# Patient Record
Sex: Female | Born: 1955 | Race: White | Hispanic: No | Marital: Married | Smoking: Never smoker
Health system: Southern US, Community
[De-identification: ages and names within clinical notes are randomized; demographics above are authoritative.]

## PROBLEM LIST (undated history)

## (undated) DIAGNOSIS — Z789 Other specified health status: Secondary | ICD-10-CM

## (undated) DIAGNOSIS — C801 Malignant (primary) neoplasm, unspecified: Secondary | ICD-10-CM

## (undated) HISTORY — PX: COLONOSCOPY: SHX174

## (undated) HISTORY — PX: ABDOMINAL HYSTERECTOMY: SHX81

---
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
## 1999-10-15 ENCOUNTER — Other Ambulatory Visit: Admission: RE | Admit: 1999-10-15 | Discharge: 1999-10-15 | Payer: Self-pay | Admitting: Obstetrics & Gynecology

## 2000-04-28 ENCOUNTER — Ambulatory Visit (HOSPITAL_COMMUNITY): Admission: RE | Admit: 2000-04-28 | Discharge: 2000-04-28 | Payer: Self-pay | Admitting: Anesthesiology

## 2000-04-28 ENCOUNTER — Encounter: Payer: Self-pay | Admitting: Anesthesiology

## 2002-01-19 ENCOUNTER — Other Ambulatory Visit: Admission: RE | Admit: 2002-01-19 | Discharge: 2002-01-19 | Payer: Self-pay | Admitting: Obstetrics & Gynecology

## 2003-04-28 HISTORY — PX: DIAGNOSTIC LAPAROSCOPY: SUR761

## 2003-05-17 ENCOUNTER — Encounter (INDEPENDENT_AMBULATORY_CARE_PROVIDER_SITE_OTHER): Payer: Self-pay | Admitting: Specialist

## 2003-05-17 ENCOUNTER — Ambulatory Visit (HOSPITAL_COMMUNITY): Admission: RE | Admit: 2003-05-17 | Discharge: 2003-05-17 | Payer: Self-pay | Admitting: Obstetrics & Gynecology

## 2004-12-31 ENCOUNTER — Other Ambulatory Visit: Admission: RE | Admit: 2004-12-31 | Discharge: 2004-12-31 | Payer: Self-pay | Admitting: Obstetrics & Gynecology

## 2006-05-05 ENCOUNTER — Encounter: Admission: RE | Admit: 2006-05-05 | Discharge: 2006-05-05 | Payer: Self-pay | Admitting: Obstetrics & Gynecology

## 2007-05-18 ENCOUNTER — Encounter: Admission: RE | Admit: 2007-05-18 | Discharge: 2007-05-18 | Payer: Self-pay | Admitting: Obstetrics & Gynecology

## 2008-05-18 ENCOUNTER — Encounter: Admission: RE | Admit: 2008-05-18 | Discharge: 2008-05-18 | Payer: Self-pay | Admitting: Obstetrics & Gynecology

## 2009-05-20 ENCOUNTER — Encounter: Admission: RE | Admit: 2009-05-20 | Discharge: 2009-05-20 | Payer: Self-pay | Admitting: Obstetrics & Gynecology

## 2010-05-18 ENCOUNTER — Encounter: Payer: Self-pay | Admitting: Obstetrics & Gynecology

## 2010-05-21 ENCOUNTER — Encounter
Admission: RE | Admit: 2010-05-21 | Discharge: 2010-05-21 | Payer: Self-pay | Source: Home / Self Care | Attending: Obstetrics & Gynecology | Admitting: Obstetrics & Gynecology

## 2010-08-20 ENCOUNTER — Other Ambulatory Visit: Payer: Self-pay | Admitting: Plastic Surgery

## 2010-09-12 NOTE — Op Note (Signed)
Dana Butler, PERDEW                         ACCOUNT NO.:  [id number]   MEDICAL RECORD NO.:  [id number]                   PATIENT TYPE:  AMB   LOCATION:  SDC                                  FACILITY:  [facility]   PHYSICIAN:  Freddy Finner, M.D.                DATE OF BIRTH:  01-01-1956   DATE OF PROCEDURE:  05/17/2003  DATE OF DISCHARGE:                                 OPERATIVE REPORT   PREOPERATIVE DIAGNOSES:  1. Chronic pelvic pain.  2. Complex cystic mass of left ovary.  3. Benign previous pelvic endometriosis.   POSTOPERATIVE DIAGNOSES:  1. Pelvic adhesions.  2. Benign follicular-type cyst of left ovary.  3. Benign follicular cyst of right ovary.   OPERATIVE PROCEDURE:  Lysis of pelvic adhesions, bilateral salpingo-  oophorectomy.   SURGEON:  Freddy Finner, M.D.   ANESTHESIA:  General.   ESTIMATED INTRAOPERATIVE BLOOD LOSS:  Twenty cubic centimeters or less.   INTRAOPERATIVE COMPLICATIONS:  None.   SECONDARY PROCEDURE:  Excision of keloid at umbilical scar.   Details of the present illness are recorded in the admission note.  Patient  was admitted on the morning of surgery.  She was brought to the operating  room, there placed under adequate general anesthesia.  She was placed in a  dorsolithotomy position using the [employer].  Betadine prep was  carried out in the usual fashion and bladder was evacuated with a Robinson  catheter under sterile technique.  Sponge forceps in the jaw of the sponge  __________ sponge on the tip was placed into the vagina for potential use  during the procedure.  Sterile drapes were applied.  A keloid scar at the  umbilicus was sharply excised.  The incision was carried down to fascia.  The anterior abdominal wall was elevated manually and a 11 mm 9 bladed  disposable trocar introduced at the umbilicus without difficulty.  Direct  inspection revealed no evidence of injury on entry.  Pneumoperitoneum was  allowed to accumulate  with carbon dioxide gas.  A small incision was made  through an old transverse lower abdominal scar in the midline and through it  a 5 mm trocar introduced without difficulty under direct visualization.  Through this trocar sleeve grasping forceps and the [place]hat were used during  the procedure.  __________ examination of pelvic and abdominal contents was  carried out, findings are recorded in still photographs and basically are  the postoperative diagnosis, appendix was visualized and was normal.  Initially the endoshears were used through the operating channel of the  laparoscope to dissect free the sigmoid epiploica on the left, the left tube  and ovary was then elevated and using the Gyrus bipolar device the  infundibulopelvic and other pedicles associated with the tube and ovary were  fulgurated and sharply divided.  There was no apparent gross abnormality of  the ovary except for some cysts, the  ovary was morcellated and retrieved  through the umbilical trocar sleeve without difficulty.  The right ovary was  stuck down against the pelvic sidewall.  Initial dissection was carried out  using the Gyrus and developing the infundibulopelvic ligament.  In the  process of attempting to bluntly dissect the ovary free a cyst was entered,  the cyst wall was lining the pelvic sidewall with very dense adhesions.  The  remaining portion of the ovary and tube were freed up with the Gyrus and  bipolar fulguration and sharp division, this ovary too was morcellated and  retrieved through the operating umbilical trocar sleeve.  An attempt was  then made to dissect the cyst wall off the pelvic sidewall, bleeding was  immediately encountered and it was elected to abandon this, there was  minimal ovarian tissue remaining and all the tissue was fulgurated with the  bipolar as well as controlling the bleeding.  Irrigation was then carried  out.  Careful examination under reduced intra-abdominal pressure and   irrigating solution revealed complete hemostasis, irrigating solution was  aspirated from the abdomen, all instruments were removed.  The skin  incisions were closed with interrupted subcuticular sutures with 3-0 Dexon.  Quarter-inch Steri-Strips were applied to the lower incision.  Patient was  awakened at this point and taken to recovery in good condition.                                               Freddy Finner, M.D.    WRN/MEDQ  D:  05/17/2003  T:  05/17/2003  Job:  [id number]

## 2010-09-12 NOTE — H&P (Signed)
Dana Butler, SAEPHAN                         ACCOUNT NO.:  [id number]   MEDICAL RECORD NO.:  [id number]                   PATIENT TYPE:  AMB   LOCATION:  SDC                                  [facility]ILITY:  [facility]   PHYSICIAN:  Freddy Finner, M.D.                DATE OF BIRTH:  [birth year]   DATE OF ADMISSION:  05/17/2003  DATE OF DISCHARGE:                                HISTORY & PHYSICAL   ADMITTING DIAGNOSES:  1. Chronic pelvic pain.  2. Known previous pelvic endometriosis.  3. 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   BRIEF HISTORY:  Patient is a 55 year old white married female gravida 3,  para 3 who has had significant GYN issues in the past resulting in a total  abdominal hysterectomy for very large uterine leiomyomata and pelvic  endometriosis done in April 1999.  In the interval since that time she has  done reasonably well until the recent past at which time she has begun to  have chronic pelvic pain.  Pelvic ultrasound in the office has shown a  complex cystic left adnexal mass in November 2004.  This is most consistent  with a hemorrhagic cyst or endometrioma.  She is known to have a history of  endometriosis.  Based on her continued symptoms she is now admitted for  laparoscopic bilateral salpingo-oophorectomy.   PAST MEDICAL HISTORY:  Patient has no known significant medical illnesses.  She is currently using Vivelle 0.05 patches twice weekly.  Surgical history  is remarkable for the abdominal hysterectomy as noted above and three  vaginal deliveries.  She has never had a blood transfusion.  She has no  known allergies to medications.  She only uses ethanol socially.  She does  not use cigarettes.   PHYSICAL EXAMINATION:  HEENT:  Grossly within normal limits.  NECK:  Thyroid gland is not palpably enlarged.  VITAL SIGNS:  Blood pressure is the office is 104/66.  CHEST:  Clear to auscultation.  HEART:  Normal sinus rhythm, without  murmurs, rubs, or gallops.  BREAST:  Normal; no nipple discharge, no skin change, no palpable masses.  ABDOMEN:  Soft and nontender, without appreciable organomegaly or palpable  masses.  EXTREMITIES:  Without cyanosis, clubbing, or edema.  PELVIC:  Deferred at this time.   ASSESSMENT:  Chronic pelvic pain, known pelvic endometriosis, known previous  ovarian cyst.   PLAN:  Laparoscopically assisted bilateral salpingo-oophorectomy.                                               Freddy Finner, M.D.    WRN/MEDQ  D:  05/16/2003  T:  05/16/2003  Job:  [id number]

## 2011-04-15 ENCOUNTER — Other Ambulatory Visit: Payer: Self-pay | Admitting: Obstetrics & Gynecology

## 2011-04-15 DIAGNOSIS — Z1231 Encounter for screening mammogram for malignant neoplasm of breast: Secondary | ICD-10-CM

## 2011-05-25 ENCOUNTER — Ambulatory Visit
Admission: RE | Admit: 2011-05-25 | Discharge: 2011-05-25 | Disposition: A | Discharge status: Home / Self Care | Payer: BC Managed Care – PPO | Source: Ambulatory Visit | Attending: Obstetrics & Gynecology | Admitting: Obstetrics & Gynecology

## 2011-05-25 DIAGNOSIS — Z1231 Encounter for screening mammogram for malignant neoplasm of breast: Secondary | ICD-10-CM

## 2011-05-27 ENCOUNTER — Other Ambulatory Visit: Payer: Self-pay | Admitting: Obstetrics & Gynecology

## 2011-05-27 DIAGNOSIS — R928 Other abnormal and inconclusive findings on diagnostic imaging of breast: Secondary | ICD-10-CM

## 2011-05-28 ENCOUNTER — Ambulatory Visit
Admission: RE | Admit: 2011-05-28 | Discharge: 2011-05-28 | Disposition: A | Discharge status: Home / Self Care | Payer: BC Managed Care – PPO | Source: Ambulatory Visit | Attending: Obstetrics & Gynecology | Admitting: Obstetrics & Gynecology

## 2011-05-28 DIAGNOSIS — R928 Other abnormal and inconclusive findings on diagnostic imaging of breast: Secondary | ICD-10-CM

## 2012-04-25 ENCOUNTER — Other Ambulatory Visit: Payer: Self-pay | Admitting: Obstetrics & Gynecology

## 2012-04-25 DIAGNOSIS — Z1231 Encounter for screening mammogram for malignant neoplasm of breast: Secondary | ICD-10-CM

## 2012-05-27 ENCOUNTER — Ambulatory Visit
Admission: RE | Admit: 2012-05-27 | Discharge: 2012-05-27 | Disposition: A | Discharge status: Home / Self Care | Payer: BC Managed Care – PPO | Source: Ambulatory Visit | Attending: Obstetrics & Gynecology | Admitting: Obstetrics & Gynecology

## 2012-05-27 DIAGNOSIS — Z1231 Encounter for screening mammogram for malignant neoplasm of breast: Secondary | ICD-10-CM

## 2013-04-25 ENCOUNTER — Other Ambulatory Visit: Payer: Self-pay

## 2013-04-25 DIAGNOSIS — Z1231 Encounter for screening mammogram for malignant neoplasm of breast: Secondary | ICD-10-CM

## 2013-05-29 ENCOUNTER — Ambulatory Visit
Admission: RE | Admit: 2013-05-29 | Discharge: 2013-05-29 | Disposition: A | Discharge status: Home / Self Care | Payer: BC Managed Care – PPO | Source: Ambulatory Visit

## 2013-05-29 DIAGNOSIS — Z1231 Encounter for screening mammogram for malignant neoplasm of breast: Secondary | ICD-10-CM

## 2014-04-27 DIAGNOSIS — C801 Malignant (primary) neoplasm, unspecified: Secondary | ICD-10-CM

## 2014-04-27 HISTORY — DX: Malignant (primary) neoplasm, unspecified: C80.1

## 2014-05-01 ENCOUNTER — Other Ambulatory Visit: Payer: Self-pay

## 2014-05-01 DIAGNOSIS — Z1231 Encounter for screening mammogram for malignant neoplasm of breast: Secondary | ICD-10-CM

## 2014-05-30 ENCOUNTER — Ambulatory Visit
Admission: RE | Admit: 2014-05-30 | Discharge: 2014-05-30 | Disposition: A | Discharge status: Home / Self Care | Payer: BLUE CROSS/BLUE SHIELD | Source: Ambulatory Visit

## 2014-05-30 DIAGNOSIS — Z1231 Encounter for screening mammogram for malignant neoplasm of breast: Secondary | ICD-10-CM

## 2014-05-31 ENCOUNTER — Other Ambulatory Visit: Payer: Self-pay | Admitting: Obstetrics & Gynecology

## 2014-05-31 DIAGNOSIS — R928 Other abnormal and inconclusive findings on diagnostic imaging of breast: Secondary | ICD-10-CM

## 2014-06-08 ENCOUNTER — Ambulatory Visit
Admission: RE | Admit: 2014-06-08 | Discharge: 2014-06-08 | Disposition: A | Discharge status: Home / Self Care | Payer: BLUE CROSS/BLUE SHIELD | Source: Ambulatory Visit | Attending: Obstetrics & Gynecology | Admitting: Obstetrics & Gynecology

## 2014-06-08 ENCOUNTER — Other Ambulatory Visit: Payer: Self-pay | Admitting: Obstetrics & Gynecology

## 2014-06-08 DIAGNOSIS — R928 Other abnormal and inconclusive findings on diagnostic imaging of breast: Secondary | ICD-10-CM

## 2014-06-15 ENCOUNTER — Other Ambulatory Visit: Payer: Self-pay | Admitting: Obstetrics & Gynecology

## 2014-06-15 DIAGNOSIS — R928 Other abnormal and inconclusive findings on diagnostic imaging of breast: Secondary | ICD-10-CM

## 2014-06-19 ENCOUNTER — Ambulatory Visit
Admission: RE | Admit: 2014-06-19 | Discharge: 2014-06-19 | Disposition: A | Discharge status: Home / Self Care | Payer: BLUE CROSS/BLUE SHIELD | Source: Ambulatory Visit | Attending: Obstetrics & Gynecology | Admitting: Obstetrics & Gynecology

## 2014-06-19 DIAGNOSIS — R928 Other abnormal and inconclusive findings on diagnostic imaging of breast: Secondary | ICD-10-CM

## 2014-06-27 ENCOUNTER — Ambulatory Visit (INDEPENDENT_AMBULATORY_CARE_PROVIDER_SITE_OTHER): Payer: Self-pay | Admitting: Surgery

## 2014-06-28 ENCOUNTER — Encounter (HOSPITAL_BASED_OUTPATIENT_CLINIC_OR_DEPARTMENT_OTHER): Payer: Self-pay | Admitting: *Deleted

## 2014-06-28 DIAGNOSIS — R591 Generalized enlarged lymph nodes: Secondary | ICD-10-CM | POA: Diagnosis present

## 2014-06-28 NOTE — H&P (Signed)
General Surgery [facility]  Dana Butler 06/27/2014 8:47 AM Location: [facility] Patient #: [id number] DOB: [birth year] Married / Language: English / Race: White Female  History of Present Illness Dana Regal MD; 06/27/2014 9:09 AM) Patient words: Eval. for lymph node bx.  The patient is a 59 year old female who presents with lymphadenopathy. Patient is seen on referral from Dr. Conchita Butler and from her husband, Dr. Georjean Butler, for evaluation of left axillary lymphadenopathy. Patient had undergone screening mammography. Abnormal lymph nodes were noted in the left axilla. Patient underwent ultrasound-guided needle biopsy showing an atypical lymphoid proliferation suspicious for a B-cell monoclonal population. Patient had noted cervical lymphadenopathy for the past 2-3 months with mild discomfort. She denies any other symptoms such as night sweats, or weight loss. Patient now presents for consideration for excisional biopsy of the left axillary lymph nodes for definitive diagnosis.   Other Problems Dana Butler Dana [place], Butler; 06/27/2014 8:47 AM) Back Pain  Past Surgical History Dana Butler [place], Butler; 06/27/2014 8:47 AM) Hysterectomy (not due to cancer) - Complete  Diagnostic Studies History Dana Regulus, Butler; 06/27/2014 8:47 AM) Colonoscopy 5-10 years ago Mammogram within last year Pap Smear 1-5 years ago  Allergies Dana Regulus, Butler; 06/27/2014 8:48 AM) No Known Drug Allergies03/05/2014  Medication History (Dana Spillers, Butler; 06/27/2014 8:50 AM) Estradiol (0.05MG /62BJ Patch TW, Transdermal) Active. Multivitamins (Oral) Active. Oscal 500/200 D-3 (500-200MG -UNIT Tablet, Oral) Active.  Social History Dana Butler Dana Butler, [place]; 06/27/2014 8:47 AM) Alcohol use Remotely quit alcohol use. Caffeine use Coffee. No drug use Tobacco use Never smoker.  Family History Dana Butler Dana [place], [place]; 06/27/2014 8:47 AM) Alcohol Abuse Father. Depression  Mother. Heart disease in female family member before age 73 Hypertension Father.  Pregnancy / Birth History Dana Butler Dana [place], [place]; 06/27/2014 8:47 AM) Age at menarche 24 years. Age of menopause 51-55 Contraceptive History Oral contraceptives. Maternal age 62-30  Review of Systems Dana Butler [place] Butler; 06/27/2014 8:47 AM) General Present- Night Sweats. Not Present- Appetite Loss, Chills, Fatigue, Fever, Weight Gain and Weight Loss. Skin Not Present- Change in Wart/Mole, Dryness, Hives, Jaundice, New Lesions, Non-Healing Wounds, Rash and Ulcer. Respiratory Not Present- Bloody sputum, Chronic Cough, Difficulty Breathing, Snoring and Wheezing. Musculoskeletal Present- Back Pain. Not Present- Joint Pain, Joint Stiffness, Muscle Pain, Muscle Weakness and Swelling of Extremities. Neurological Present- Headaches. Not Present- Decreased Memory, Fainting, Numbness, Seizures, Tingling, Tremor, Trouble walking and Weakness. Hematology Not Present- Easy Bruising, Excessive bleeding, Gland problems, HIV and Persistent Infections.   Vitals (Dana Butler; 06/27/2014 9:07 AM) 06/27/2014 8:50 AM Weight: 124 lb Height: 62in Body Surface Area: 1.57 m Body Mass Index: 22.68 kg/m Pulse: 80 (Regular)  BP: 118/70 (Sitting, Left Arm, Standard)    Physical Exam Dana Regal MD; 06/27/2014 9:11 AM)  General - appears comfortable, no distress; not diaphorectic  HEENT - normocephalic; sclerae clear, gaze conjugate; mucous membranes moist, dentition good; voice normal  Neck - symmetric on extension; no palpable masses in the thyroid bed; palpable 1-2 cm smooth soft lymph nodes in the posterior cervical chain bilaterally; no anterior cervical lymph nodes; two 1-2 cm soft rounded lymph nodes in the left scalene region  Axilla - no palpable adenopathy in the right axilla; left axilla with palpable rounded relatively firm lymph nodes in the high axilla against the chest wall, nontender  Chest -  clear bilaterally with rhonchi, rales, or wheeze  Cor - regular rhythm with normal rate; no significant murmur  Abd - soft without distension; no hepatosplenomegaly; no  sign of hernia  Ext - non-tender without significant edema or lymphedema  Neuro - grossly intact; no tremor    Assessment & Plan Dana Regal MD; 06/27/2014 9:13 AM)  LYMPHADENOPATHY (785.6  R59.1)  Patient has lymphadenopathy involving the left axilla, the posterior cervical lymph node chains, in the left interscalene lymph nodes. Previous needle biopsy shows an atypical lymphoproliferative process. Further tissue is requested for definitive diagnosis.  I have recommended left axillary lymph node excisional biopsy under general anesthesia. We will make arrangements for this procedure in the immediate future. Risk and benefits are reviewed with the patient and her husband including the possibility of seroma formation. They understand and wish to proceed.  The risks and benefits of the procedure have been discussed at length with the patient. The patient understands the proposed procedure, potential alternative treatments, and the course of recovery to be expected. All of the patient's questions have been answered at this time. The patient wishes to proceed with surgery.  Dana Regal, MD, [facility] Office: [phone]

## 2014-06-28 NOTE — Progress Notes (Signed)
No labs needed

## 2014-06-29 ENCOUNTER — Ambulatory Visit (HOSPITAL_BASED_OUTPATIENT_CLINIC_OR_DEPARTMENT_OTHER)
Admission: RE | Admit: 2014-06-29 | Discharge: 2014-06-29 | Disposition: A | Discharge status: Home / Self Care | Payer: BLUE CROSS/BLUE SHIELD | Source: Ambulatory Visit | Attending: Surgery | Admitting: Surgery

## 2014-06-29 ENCOUNTER — Ambulatory Visit (HOSPITAL_BASED_OUTPATIENT_CLINIC_OR_DEPARTMENT_OTHER): Payer: BLUE CROSS/BLUE SHIELD | Admitting: Anesthesiology

## 2014-06-29 ENCOUNTER — Encounter (HOSPITAL_BASED_OUTPATIENT_CLINIC_OR_DEPARTMENT_OTHER): Payer: Self-pay | Admitting: *Deleted

## 2014-06-29 ENCOUNTER — Encounter (HOSPITAL_BASED_OUTPATIENT_CLINIC_OR_DEPARTMENT_OTHER)
Admission: RE | Disposition: A | Discharge status: Home / Self Care | Payer: Self-pay | Source: Ambulatory Visit | Attending: Surgery

## 2014-06-29 DIAGNOSIS — C8514 Unspecified B-cell lymphoma, lymph nodes of axilla and upper limb: Secondary | ICD-10-CM | POA: Diagnosis not present

## 2014-06-29 DIAGNOSIS — Z79899 Other long term (current) drug therapy: Secondary | ICD-10-CM | POA: Diagnosis not present

## 2014-06-29 DIAGNOSIS — R591 Generalized enlarged lymph nodes: Secondary | ICD-10-CM | POA: Diagnosis present

## 2014-06-29 DIAGNOSIS — Z79810 Long term (current) use of selective estrogen receptor modulators (SERMs): Secondary | ICD-10-CM | POA: Diagnosis not present

## 2014-06-29 HISTORY — DX: Other specified health status: Z78.9

## 2014-06-29 HISTORY — PX: AXILLARY LYMPH NODE BIOPSY: SHX5737

## 2014-06-29 LAB — POCT HEMOGLOBIN-HEMACUE: Hemoglobin: 11.2 g/dL — ABNORMAL LOW (ref 12.0–15.0)

## 2014-06-29 SURGERY — AXILLARY LYMPH NODE BIOPSY
Anesthesia: General | Site: Axilla | Laterality: Left

## 2014-06-29 MED ORDER — LIDOCAINE HCL (CARDIAC) 20 MG/ML IV SOLN
INTRAVENOUS | Status: DC | PRN
  Administered 2014-06-29: 60 mg via INTRAVENOUS

## 2014-06-29 MED ORDER — HYDROMORPHONE HCL 1 MG/ML IJ SOLN: 0.2500 mg | INTRAMUSCULAR | Status: DC | PRN

## 2014-06-29 MED ORDER — LACTATED RINGERS IV SOLN
INTRAVENOUS | Status: DC
  Administered 2014-06-29: 11:00:00 via INTRAVENOUS

## 2014-06-29 MED ORDER — METHYLENE BLUE 1 % INJ SOLN
INTRAMUSCULAR | Status: AC
  Filled 2014-06-29: qty 10

## 2014-06-29 MED ORDER — OXYCODONE HCL 5 MG/5ML PO SOLN: 5.0000 mg | Freq: Once | ORAL | Status: DC | PRN

## 2014-06-29 MED ORDER — BUPIVACAINE HCL (PF) 0.5 % IJ SOLN
INTRAMUSCULAR | Status: DC | PRN
  Administered 2014-06-29: 10 mL

## 2014-06-29 MED ORDER — MIDAZOLAM HCL 2 MG/2ML IJ SOLN
1.0000 mg | INTRAMUSCULAR | Status: DC | PRN
  Administered 2014-06-29: 2 mg via INTRAVENOUS

## 2014-06-29 MED ORDER — DEXAMETHASONE SODIUM PHOSPHATE 4 MG/ML IJ SOLN
INTRAMUSCULAR | Status: DC | PRN
  Administered 2014-06-29: 10 mg via INTRAVENOUS

## 2014-06-29 MED ORDER — MIDAZOLAM HCL 2 MG/2ML IJ SOLN
INTRAMUSCULAR | Status: AC
  Filled 2014-06-29: qty 2

## 2014-06-29 MED ORDER — FENTANYL CITRATE 0.05 MG/ML IJ SOLN
50.0000 ug | INTRAMUSCULAR | Status: DC | PRN
  Administered 2014-06-29: 100 ug via INTRAVENOUS

## 2014-06-29 MED ORDER — HYDROCODONE-ACETAMINOPHEN 5-325 MG PO TABS: 1.0000 | ORAL_TABLET | ORAL | Status: DC | PRN

## 2014-06-29 MED ORDER — PROPOFOL 10 MG/ML IV BOLUS
INTRAVENOUS | Status: DC | PRN
  Administered 2014-06-29: 200 mg via INTRAVENOUS

## 2014-06-29 MED ORDER — BUPIVACAINE HCL (PF) 0.25 % IJ SOLN
INTRAMUSCULAR | Status: AC
  Filled 2014-06-29: qty 30

## 2014-06-29 MED ORDER — SODIUM CHLORIDE 0.9 % IJ SOLN
INTRAMUSCULAR | Status: AC
  Filled 2014-06-29: qty 10

## 2014-06-29 MED ORDER — ONDANSETRON HCL 4 MG/2ML IJ SOLN: 4.0000 mg | Freq: Once | INTRAMUSCULAR | Status: DC | PRN

## 2014-06-29 MED ORDER — BUPIVACAINE-EPINEPHRINE (PF) 0.5% -1:200000 IJ SOLN
INTRAMUSCULAR | Status: DC | PRN
  Administered 2014-06-29: 25 mL via PERINEURAL

## 2014-06-29 MED ORDER — ONDANSETRON HCL 4 MG/2ML IJ SOLN
INTRAMUSCULAR | Status: DC | PRN
  Administered 2014-06-29: 4 mg via INTRAVENOUS

## 2014-06-29 MED ORDER — OXYCODONE HCL 5 MG PO TABS
5.0000 mg | ORAL_TABLET | Freq: Once | ORAL | Status: DC | PRN
Start: 2014-06-29 — End: 2014-06-29

## 2014-06-29 MED ORDER — SCOPOLAMINE 1 MG/3DAYS TD PT72
1.0000 | MEDICATED_PATCH | TRANSDERMAL | Status: DC
  Administered 2014-06-29: 1.5 mg via TRANSDERMAL

## 2014-06-29 MED ORDER — FENTANYL CITRATE 0.05 MG/ML IJ SOLN
INTRAMUSCULAR | Status: AC
  Filled 2014-06-29: qty 6

## 2014-06-29 MED ORDER — FENTANYL CITRATE 0.05 MG/ML IJ SOLN
INTRAMUSCULAR | Status: AC
  Filled 2014-06-29: qty 2

## 2014-06-29 MED ORDER — CEFAZOLIN SODIUM-DEXTROSE 2-3 GM-% IV SOLR
2.0000 g | INTRAVENOUS | Status: AC
  Administered 2014-06-29: 2 g via INTRAVENOUS

## 2014-06-29 MED ORDER — CEFAZOLIN SODIUM-DEXTROSE 2-3 GM-% IV SOLR
INTRAVENOUS | Status: AC
  Filled 2014-06-29: qty 50

## 2014-06-29 MED ORDER — SCOPOLAMINE 1 MG/3DAYS TD PT72
MEDICATED_PATCH | TRANSDERMAL | Status: AC
  Filled 2014-06-29: qty 1

## 2014-06-29 MED ORDER — BUPIVACAINE HCL (PF) 0.5 % IJ SOLN
INTRAMUSCULAR | Status: AC
  Filled 2014-06-29: qty 30

## 2014-06-29 SURGICAL SUPPLY — 51 items
APPLIER CLIP 11 MED OPEN (CLIP)
BENZOIN TINCTURE PRP APPL 2/3 (GAUZE/BANDAGES/DRESSINGS) ×2 IMPLANT
BLADE HEX COATED 2.75 (ELECTRODE) ×2 IMPLANT
BLADE SURG 10 STRL SS (BLADE) ×2 IMPLANT
BLADE SURG 15 STRL LF DISP TIS (BLADE) ×1 IMPLANT
BLADE SURG 15 STRL SS (BLADE) ×1
CANISTER SUCT 1200ML W/VALVE (MISCELLANEOUS) ×2 IMPLANT
CHLORAPREP W/TINT 26ML (MISCELLANEOUS) ×2 IMPLANT
CLIP APPLIE 11 MED OPEN (CLIP) IMPLANT
CLIP TI WIDE RED SMALL 6 (CLIP) ×2 IMPLANT
COVER BACK TABLE 60X90IN (DRAPES) ×2 IMPLANT
COVER MAYO STAND STRL (DRAPES) ×2 IMPLANT
COVER PROBE W GEL 5X96 (DRAPES) ×2 IMPLANT
DECANTER SPIKE VIAL GLASS SM (MISCELLANEOUS) IMPLANT
DRAIN HEMOVAC 1/8 X 5 (WOUND CARE) IMPLANT
DRAPE LAPAROSCOPIC ABDOMINAL (DRAPES) ×2 IMPLANT
DRAPE UTILITY XL STRL (DRAPES) ×2 IMPLANT
ELECT REM PT RETURN 9FT ADLT (ELECTROSURGICAL) ×2
ELECTRODE REM PT RTRN 9FT ADLT (ELECTROSURGICAL) ×1 IMPLANT
EVACUATOR SILICONE 100CC (DRAIN) IMPLANT
GLOVE BIOGEL M STRL SZ7.5 (GLOVE) ×2 IMPLANT
GLOVE BIOGEL PI IND STRL 7.5 (GLOVE) ×2 IMPLANT
GLOVE BIOGEL PI INDICATOR 7.5 (GLOVE) ×2
GLOVE EXAM NITRILE MD LF STRL (GLOVE) ×2 IMPLANT
GLOVE SURG ORTHO 8.0 STRL STRW (GLOVE) ×2 IMPLANT
GLOVE SURG SS PI 7.0 STRL IVOR (GLOVE) ×2 IMPLANT
GOWN STRL REUS W/ TWL LRG LVL3 (GOWN DISPOSABLE) ×1 IMPLANT
GOWN STRL REUS W/ TWL XL LVL3 (GOWN DISPOSABLE) ×1 IMPLANT
GOWN STRL REUS W/TWL LRG LVL3 (GOWN DISPOSABLE) ×1
GOWN STRL REUS W/TWL XL LVL3 (GOWN DISPOSABLE) ×1
LIQUID BAND (GAUZE/BANDAGES/DRESSINGS) ×2 IMPLANT
NEEDLE HYPO 25X1 1.5 SAFETY (NEEDLE) ×2 IMPLANT
PACK BASIN DAY SURGERY FS (CUSTOM PROCEDURE TRAY) ×2 IMPLANT
PENCIL BUTTON HOLSTER BLD 10FT (ELECTRODE) ×2 IMPLANT
PIN SAFETY STERILE (MISCELLANEOUS) IMPLANT
SLEEVE SCD COMPRESS KNEE MED (MISCELLANEOUS) ×2 IMPLANT
SPONGE LAP 18X18 X RAY DECT (DISPOSABLE) ×2 IMPLANT
STRIP CLOSURE SKIN 1/2X4 (GAUZE/BANDAGES/DRESSINGS) ×2 IMPLANT
SUT ETHILON 3 0 FSL (SUTURE) IMPLANT
SUT MNCRL AB 3-0 PS2 18 (SUTURE) IMPLANT
SUT MNCRL AB 4-0 PS2 18 (SUTURE) ×2 IMPLANT
SUT VIC AB 2-0 SH 27 (SUTURE)
SUT VIC AB 2-0 SH 27XBRD (SUTURE) IMPLANT
SUT VIC AB 3-0 SH 27 (SUTURE) ×1
SUT VIC AB 3-0 SH 27X BRD (SUTURE) ×1 IMPLANT
SUT VICRYL 3-0 CR8 SH (SUTURE) IMPLANT
SYR CONTROL 10ML LL (SYRINGE) ×2 IMPLANT
TOWEL OR 17X24 6PK STRL BLUE (TOWEL DISPOSABLE) ×2 IMPLANT
TOWEL OR NON WOVEN STRL DISP B (DISPOSABLE) ×2 IMPLANT
TUBE CONNECTING 20X1/4 (TUBING) ×2 IMPLANT
YANKAUER SUCT BULB TIP NO VENT (SUCTIONS) ×2 IMPLANT

## 2014-06-29 NOTE — Anesthesia Postprocedure Evaluation (Signed)
  Anesthesia Post-op Note  Patient: Dana Butler  Procedure(s) Performed: Procedure(s): AXILLARY LYMPH NODE BIOPSY (Left)  Patient Location: PACU  Anesthesia Type: General with regional for post op pain   Level of Consciousness: awake, alert  and oriented  Airway and Oxygen Therapy: Patient Spontanous Breathing  Post-op Pain: mild  Post-op Assessment: Post-op Vital signs reviewed  Post-op Vital Signs: Reviewed  Last Vitals:  Filed Vitals:   06/29/14 1355  BP: 132/66  Pulse: 51  Temp: 36.7 C  Resp: 16    Complications: No apparent anesthesia complications

## 2014-06-29 NOTE — Interval H&P Note (Signed)
History and Physical Interval Note:  06/29/2014 11:51 AM  Dana Butler  has presented today for surgery, with the diagnosis of left axillay lymphadenopathy.  The various methods of treatment have been discussed with the patient and family. After consideration of risks, benefits and other options for treatment, the patient has consented to    Procedure(s): AXILLARY LYMPH NODE BIOPSY (Left) as a surgical intervention .    The patient's history has been reviewed, patient examined, no change in status, stable for surgery.  I have reviewed the patient's chart and labs.  Questions were answered to the patient's satisfaction.    [name], MD, [facility] Office: [place]

## 2014-06-29 NOTE — Transfer of Care (Signed)
Immediate Anesthesia Transfer of Care Note  Patient: Dana Butler  Procedure(s) Performed: Procedure(s): AXILLARY LYMPH NODE BIOPSY (Left)  Patient Location: PACU  Anesthesia Type:General  Level of Consciousness: awake and sedated  Airway & Oxygen Therapy: Patient Spontanous Breathing and Patient connected to face mask oxygen  Post-op Assessment: Report given to RN and Post -op Vital signs reviewed and stable  Post vital signs: Reviewed and stable  Last Vitals:  Filed Vitals:   06/29/14 1306  BP:   Pulse: 75  Temp:   Resp: 17    Complications: No apparent anesthesia complications

## 2014-06-29 NOTE — Anesthesia Procedure Notes (Addendum)
Anesthesia Regional Block:  Pectoralis block  Pre-Anesthetic Checklist: ,, timeout performed, Correct Patient, Correct Site, Correct Laterality, Correct Procedure, Correct Position, site marked, Risks and benefits discussed,  Surgical consent,  Pre-op evaluation,  At surgeon's request and post-op pain management  Laterality: Left and Upper  Prep: chloraprep       Needles:  Injection technique: Single-shot  Needle Type: Echogenic Needle     Needle Length: 9cm 9 cm Needle Gauge: 21 and 21 G    Additional Needles:  Procedures: ultrasound guided (picture in chart) Pectoralis block Narrative:  Start time: 06/29/2014 11:26 AM End time: 06/29/2014 11:30 AM Injection made incrementally with aspirations every 5 mL.  Performed by: Personally  Anesthesiologist: CREWS, DAVID A   Procedure Name: LMA Insertion Performed by: Terrance Mass Pre-anesthesia Checklist: Patient identified, Timeout performed, Emergency Drugs available, Suction available and Patient being monitored Patient Re-evaluated:Patient Re-evaluated prior to inductionOxygen Delivery Method: Circle system utilized Preoxygenation: Pre-oxygenation with 100% oxygen Intubation Type: IV induction Ventilation: Mask ventilation without difficulty LMA: LMA inserted LMA Size: 4.0 Number of attempts: 1 Placement Confirmation: positive ETCO2 Tube secured with: Tape Dental Injury: Teeth and Oropharynx as per pre-operative assessment

## 2014-06-29 NOTE — Discharge Instructions (Signed)

## 2014-06-29 NOTE — Progress Notes (Signed)
Assisted Dr. Crews with left, ultrasound guided, pectoralis block. Side rails up, monitors on throughout procedure. See vital signs in flow sheet. Tolerated Procedure well. 

## 2014-06-29 NOTE — Brief Op Note (Signed)
06/29/2014  12:58 PM  PATIENT:  Dana Butler  59 y.o. female  PRE-OPERATIVE DIAGNOSIS:  left axillay lymphadenopathy  POST-OPERATIVE DIAGNOSIS:  left axillay lymphadenopathy  PROCEDURE:  Procedure(s): AXILLARY LYMPH NODE BIOPSY (Left)  SURGEON:  Surgeon(s) and Role:    * Armandina Gemma, MD - Primary  ANESTHESIA:   general  EBL:  Total I/O In: 1000 [I.V.:1000] Out: -   BLOOD ADMINISTERED:none  DRAINS: none   LOCAL MEDICATIONS USED:  MARCAINE     SPECIMEN:  Excision  DISPOSITION OF SPECIMEN:  PATHOLOGY  COUNTS:  YES  TOURNIQUET:  * No tourniquets in log *  DICTATION: .Other Dictation: Dictation Number [id number]  PLAN OF CARE: Discharge to home after PACU  PATIENT DISPOSITION:  PACU - hemodynamically stable.   Delay start of Pharmacological VTE agent (>24hrs) due to surgical blood loss or risk of bleeding: yes  [name], MD, [facility] Office: [phone]

## 2014-06-29 NOTE — Anesthesia Preprocedure Evaluation (Signed)
Anesthesia Evaluation  Patient identified by MRN, date of birth, ID band Patient awake    Reviewed: Allergy & Precautions, NPO status , Patient's Chart, lab work & pertinent test results  Airway Mallampati: I  TM Distance: >3 FB Neck ROM: Full  Mouth opening: Limited Mouth Opening  Dental  (+) Teeth Intact, Dental Advisory Given   Pulmonary  breath sounds clear to auscultation        Cardiovascular Rhythm:Regular Rate:Normal     Neuro/Psych    GI/Hepatic   Endo/Other    Renal/GU      Musculoskeletal   Abdominal   Peds  Hematology   Anesthesia Other Findings   Reproductive/Obstetrics                             Anesthesia Physical Anesthesia Plan  ASA: II  Anesthesia Plan: General   Post-op Pain Management:    Induction: Intravenous  Airway Management Planned: LMA  Additional Equipment:   Intra-op Plan:   Post-operative Plan: Extubation in OR  Informed Consent: I have reviewed the patients History and Physical, chart, labs and discussed the procedure including the risks, benefits and alternatives for the proposed anesthesia with the patient or authorized representative who has indicated his/her understanding and acceptance.   Dental advisory given  Plan Discussed with: Anesthesiologist, CRNA and Surgeon  Anesthesia Plan Comments:         Anesthesia Quick Evaluation

## 2014-07-02 ENCOUNTER — Encounter (HOSPITAL_BASED_OUTPATIENT_CLINIC_OR_DEPARTMENT_OTHER): Payer: Self-pay | Admitting: Surgery

## 2014-07-02 NOTE — Op Note (Signed)
Dana Butler, Dana Butler               ACCOUNT NO.:  [id number]  MEDICAL RECORD NO.:  [id number]  LOCATION:                                 FACILITY:  PHYSICIAN:  [name], MD      DATE OF BIRTH:  07-Jun-1955  DATE OF PROCEDURE:  06/29/2014                              OPERATIVE REPORT   PREOPERATIVE DIAGNOSIS:  Left axillary lymphadenopathy, rule out lymphoma.  POSTOPERATIVE DIAGNOSIS:  Left axillary lymphadenopathy, rule out lymphoma.  PROCEDURE:  Left axillary lymph node excisional biopsy (4 lymph nodes).  SURGEON:  [name], MD, FACS  ANESTHESIA:  General per Dr. Lorrene Reid.  ESTIMATED BLOOD LOSS:  Minimal.  PREPARATION:  ChloraPrep.  COMPLICATIONS:  None.  INDICATIONS:  The patient is a 59 year old female, who was noted  to have left axillary lymphadenopathy on screening mammography.  She underwent percutaneous needle biopsy, which showed an atypical lymphoid proliferation.  Additional tissue was requested for  definitive diagnosis.  The patient now comes to Surgery for a left axillary lymph node excisional biopsy.  BODY OF REPORT:  Procedure was done in OR #3 at the [facility].  The patient was brought to the operating room, placed in supine position on the operating room table.  Following administration of general anesthesia, the patient was positioned and then prepped and draped in usual aseptic fashion.  After ascertaining that an adequate level of anesthesia had been achieved, a high left axillary incision was made with a #15 blade.  Dissection was carried through subcutaneous tissues and hemostasis achieved with electrocautery.  Dissection was  carried into the axilla and lymph nodes were palpable.  These were isolated with an Allis clamp and gently dissected out using the electrocautery for hemostasis.  Vascular structures were divided between small Ligaclips.  Four separate enlarged soft lymph nodes are removed.  These appear   pathologic.  They are completely excised and  submitted fresh in saline to Pathology for evaluation.  Hemostasis was achieved with electrocautery.  Subcutaneous tissues were reapproximated with  interrupted 3-0 Vicryl sutures.  Skin was anesthetized with local anesthetic.  Skin edges were  reapproximated with a running 4-0 Monocryl subcuticular suture.  Wound was washed and dried and Dermabond was applied to the skin as dressing.  The patient was awakened from anesthesia and brought to the recovery room.  The patient tolerated the procedure well.   [name], MD, [facility] Office: [phone]    TMG/MEDQ  D:  06/29/2014  T:  06/29/2014  Job:  [id number]

## 2014-07-03 ENCOUNTER — Other Ambulatory Visit: Payer: Self-pay | Admitting: Oncology

## 2014-07-03 DIAGNOSIS — C8590 Non-Hodgkin lymphoma, unspecified, unspecified site: Secondary | ICD-10-CM

## 2014-07-10 ENCOUNTER — Telehealth: Payer: Self-pay | Admitting: Oncology

## 2014-07-10 ENCOUNTER — Other Ambulatory Visit: Payer: Self-pay | Admitting: Oncology

## 2014-07-10 DIAGNOSIS — C8590 Non-Hodgkin lymphoma, unspecified, unspecified site: Secondary | ICD-10-CM

## 2014-07-10 NOTE — Telephone Encounter (Signed)
Pt aware of np appt on 07/20/14@11 :00 - per Dr. Jana Hakim Pt will have labs on 07/17/14@9 :00

## 2014-07-16 ENCOUNTER — Other Ambulatory Visit: Payer: Self-pay | Admitting: Oncology

## 2014-07-17 ENCOUNTER — Other Ambulatory Visit (HOSPITAL_BASED_OUTPATIENT_CLINIC_OR_DEPARTMENT_OTHER): Payer: BLUE CROSS/BLUE SHIELD

## 2014-07-17 ENCOUNTER — Other Ambulatory Visit: Payer: Self-pay | Admitting: *Deleted

## 2014-07-17 DIAGNOSIS — C8590 Non-Hodgkin lymphoma, unspecified, unspecified site: Secondary | ICD-10-CM

## 2014-07-17 DIAGNOSIS — R591 Generalized enlarged lymph nodes: Secondary | ICD-10-CM

## 2014-07-17 LAB — COMPREHENSIVE METABOLIC PANEL (CC13)
ALBUMIN: 3.8 g/dL (ref 3.5–5.0)
ALK PHOS: 100 U/L (ref 40–150)
ALT: 29 U/L (ref 0–55)
ANION GAP: 9 meq/L (ref 3–11)
AST: 27 U/L (ref 5–34)
BUN: 14.8 mg/dL (ref 7.0–26.0)
CALCIUM: 9.2 mg/dL (ref 8.4–10.4)
CO2: 27 meq/L (ref 22–29)
CREATININE: 0.7 mg/dL (ref 0.6–1.1)
Chloride: 107 mEq/L (ref 98–109)
EGFR: >90 mL/min/{1.73_m2} (ref >90)
GLUCOSE: 95 mg/dL (ref 70–140)
Potassium: 3.9 mEq/L (ref 3.5–5.1)
Sodium: 143 mEq/L (ref 136–145)
TOTAL PROTEIN: 6.5 g/dL (ref 6.4–8.3)
Total Bilirubin: 0.56 mg/dL (ref 0.20–1.20)

## 2014-07-17 LAB — CBC & DIFF AND RETIC
BASO%: 0.5 % (ref 0.0–2.0)
BASOS ABS: 0 10*3/uL (ref 0.0–0.1)
EOS ABS: 0.3 10*3/uL (ref 0.0–0.5)
EOS%: 4.7 % (ref 0.0–7.0)
HCT: 38.2 % (ref 34.8–46.6)
HEMOGLOBIN: 12.9 g/dL (ref 11.6–15.9)
IMMATURE RETIC FRACT: 6.8 % (ref 1.60–10.00)
LYMPH#: 3.3 10*3/uL (ref 0.9–3.3)
LYMPH%: 58 % — AB (ref 14.0–49.7)
MCH: 31.3 pg (ref 25.1–34.0)
MCHC: 33.8 g/dL (ref 31.5–36.0)
MCV: 92.7 fL (ref 79.5–101.0)
MONO#: 0.2 10*3/uL (ref 0.1–0.9)
MONO%: 4.2 % (ref 0.0–14.0)
NEUT%: 32.6 % — AB (ref 38.4–76.8)
NEUTROS ABS: 1.9 10*3/uL (ref 1.5–6.5)
Platelets: 233 10*3/uL (ref 145–400)
RBC: 4.12 10*6/uL (ref 3.70–5.45)
RDW: 14.9 % — ABNORMAL HIGH (ref 11.2–14.5)
Retic %: 1.17 % (ref 0.70–2.10)
Retic Ct Abs: 48.2 10*3/uL (ref 33.70–90.70)
WBC: 5.7 10*3/uL (ref 3.9–10.3)

## 2014-07-17 LAB — CHCC SMEAR

## 2014-07-17 LAB — LACTATE DEHYDROGENASE (CC13): LDH: 169 U/L (ref 125–245)

## 2014-07-18 LAB — VITAMIN D 25 HYDROXY (VIT D DEFICIENCY, FRACTURES): VIT D 25 HYDROXY: 59 ng/mL (ref 30–100)

## 2014-07-19 ENCOUNTER — Ambulatory Visit (HOSPITAL_COMMUNITY)
Admission: RE | Admit: 2014-07-19 | Discharge: 2014-07-19 | Disposition: A | Discharge status: Home / Self Care | Payer: BLUE CROSS/BLUE SHIELD | Source: Ambulatory Visit | Attending: Oncology | Admitting: Oncology

## 2014-07-19 DIAGNOSIS — C8590 Non-Hodgkin lymphoma, unspecified, unspecified site: Secondary | ICD-10-CM | POA: Insufficient documentation

## 2014-07-19 LAB — GLUCOSE, CAPILLARY: GLUCOSE-CAPILLARY: 91 mg/dL (ref 70–99)

## 2014-07-19 LAB — BETA 2 MICROGLOBULIN, SERUM: Beta-2 Microglobulin: 2.85 mg/L — ABNORMAL HIGH (ref <=2.51)

## 2014-07-19 MED ORDER — FLUDEOXYGLUCOSE F - 18 (FDG) INJECTION
6.8000 | Freq: Once | INTRAVENOUS | Status: AC | PRN
  Administered 2014-07-19: 6.8 via INTRAVENOUS

## 2014-07-20 ENCOUNTER — Telehealth: Payer: Self-pay | Admitting: Oncology

## 2014-07-20 ENCOUNTER — Ambulatory Visit (HOSPITAL_BASED_OUTPATIENT_CLINIC_OR_DEPARTMENT_OTHER): Payer: BLUE CROSS/BLUE SHIELD | Admitting: Oncology

## 2014-07-20 ENCOUNTER — Encounter: Payer: Self-pay | Admitting: Oncology

## 2014-07-20 ENCOUNTER — Ambulatory Visit: Payer: BLUE CROSS/BLUE SHIELD

## 2014-07-20 VITALS — BP 122/67 | HR 66 | Temp 98.2°F | Resp 18 | Ht 62.0 in | Wt 126.4 lb

## 2014-07-20 DIAGNOSIS — C8590 Non-Hodgkin lymphoma, unspecified, unspecified site: Secondary | ICD-10-CM | POA: Diagnosis not present

## 2014-07-20 NOTE — Progress Notes (Signed)
[place]  Telephone:(336) [phone] Fax:(336) [phone]     ID: Dana Butler DOB: 12-May-1955  MR#: [id number]  MAU#:[id number]  Patient Care Team: Cari Caraway, MD as PCP - General (Family Medicine) Jodi Marble, MD as Consulting Physician (Otolaryngology) Armandina Gemma, MD as Consulting Physician (General Surgery) PCP: Cari Caraway, MD OTHER MD:  CHIEF COMPLAINT: Marginal zone non-Hodgkin's lymphoma  CURRENT TREATMENT: Observation   HISTORY OF PRESENT ILNESS: Dana Butler had some "shooting pains across her neck" earliy in 2016, and saw ENT regarding this. There was a question of mild cervical adenopathy noted at that time. More recently, she underwent screening mammography 05/30/2014 at the [facility]. This suggested enlarged lymph nodes in the left axilla and the patient was recalled for left axillary ultrasound 06/08/2014. There were 2 enlarged lymph nodes, one measuring 2.4 cm, retaining its fatty hilum, but with the cortex measuring 4.5 mm. Posterior to that there was a second lymph node measuring 3.1 cm, with no internal fat. There was no mass noted in the upper outer quadrant of the left breast and review of the earlier mammogram showed no mammographic abnormality in either breast.  Biopsy of one of the left axillary lymph nodes 06/19/2014 showed ([id number]) a relatively monomorphic population of small round to slightly irregular lymphocytes, with dense chromatin and inconspicuous nucleoli. Flow cytometry ([id number]) showed a lambda restricted B-cell population expressing CD20, but not CD5, CD10 or cycling D1. Ki-67 was less than 10%. A preliminary diagnosis of B-cell non-Hodgkin's lymphoma, low-grade, was made..  On 06/29/2014 the patient underwent left axillary excisional biopsy with a total of 4 lymph nodes removed. The pathology from this more extensive sample showed ([id number]) effacement of the lymph node architecture by the lymphoma cells previously  described, with only scattered larger lymphoid cells and histiocytes and no significant plasma cell component. There were scattered mitotic figures and no necrosis. Repeat flow cytometry showed most of the lymphocytes were CD20, CD23 and CD79a positive, with coexpression of CD43 but not CD5, CD10, BCL-6 or cyclin D1. The cells were BCL-2 positive. Overall it was pathology's impression that this was a low-grade B-cell non-Hodgkin's lymphoma, marginal zone type.  On 07/19/2014 Dana Butler underwent PET scanning which showed bilateral cervical adenopathy, bilateral axillary adenopathy, some small left common iliac lymph nodes and a normal spleen. There was very low metabolic activity in all the lymph nodes noted and similarly in the pelvic marrow suggesting possible marrow involvement.  The patient's subsequent history is as detailed below  INTERVAL HISTORY: Dana Butler was evaluated in the cancer clinic 07/20/2014 accompanied by her husband Dana Butler M.D.  Dana Ice").  REVIEW OF SYSTEMS: Dana Butler's cervical discomfort has resolved and may have been unrelated to her lymphoma diagnosis. She reports no fevers, drenching sweats, unexplained weight loss or unexplained fatigue, no rash, bleeding, or pruritus. She exercises regularly. A detailed review of systems today was otherwise entirely benign  PAST MEDICAL HISTORY: Past Medical History  Diagnosis Date  . Medical history non-contributory     PAST SURGICAL HISTORY: Past Surgical History  Procedure Laterality Date  . Diagnostic laparoscopy  2005    BSO  . Abdominal hysterectomy    . Colonoscopy    . Axillary lymph node biopsy Left 06/29/2014    Procedure: AXILLARY LYMPH NODE BIOPSY;  Surgeon: Armandina Gemma, MD;  Location: [facility];  Service: General;  Laterality: Left;    FAMILY HISTORY No family history on file. The patient's father died at the age of 41, the patient's  mother at age 48. The patient has 2 brothers, no sisters. Dana Butler's  maternal grandmother had a diagnosis of leukemia, type unknown, in her 30s. A maternal cousin has a history of lymphoma.  GYNECOLOGIC HISTORY:  No LMP recorded. Patient has had a hysterectomy. Menarche age 28, first live birth age 33, the patient is GX P3. She underwent a hysterectomy with bilateral salpingo-oophorectomy at age 28. She continues on hormone replacement.  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 Her husband "Dana Butler" just retired from anesthesia July 2015. (Incidentally he was the anesthesiologist during my prostatectomy 2004). Their children are currently age 69, 40, and 13. There are no grandchildren. The patient attends [employer]: In place   HEALTH MAINTENANCE: History  Substance Use Topics  . Smoking status: Never Smoker   . Smokeless tobacco: Not on file  . Alcohol Use: No     Colonoscopy:  PAP:  Bone density:  Lipid panel:  No Known Allergies  Current Outpatient Prescriptions  Medication Sig Dispense Refill  . estradiol (VIVELLE-DOT) 0.05 MG/24HR patch Place 1 patch onto the skin 2 (two) times a week.    [name] HYDROcodone-acetaminophen (NORCO/VICODIN) 5-325 MG per tablet Take 1-2 tablets by mouth every 4 (four) hours as needed for moderate pain. 20 tablet 0  . Multiple Vitamins-Minerals (MULTIVITAMIN WITH MINERALS) tablet Take 1 tablet by mouth daily.     No current facility-administered medications for this visit.    OBJECTIVE: Middle-aged white woman who appears younger than stated age 20 Vitals:   07/20/14 1044  BP: 122/67  Pulse: 66  Temp: 98.2 F (36.8 C)  Resp: 18     Body mass index is 23.11 kg/(m^2).    ECOG FS:0 - Asymptomatic  Ocular: Sclerae unicteric, pupils equal, round and reactive to light Ear-nose-throat: Oropharynx clear, dentition in good repair Lymphatic: Minimal bilateral cervical and supraclavicular adenopathy, slightly more prominent on the right than the  left, the lymph nodes being maximally 1-1/2 cm, flat, and soft. I do not palpate any axillary or inguinal adenopathy. Lungs no rales or rhonchi, good excursion bilaterally Heart regular rate and rhythm, no murmur appreciated Abd soft, nontender, positive bowel sounds MSK no focal spinal tenderness, no joint edema Neuro: non-focal, well-oriented, positive affect Breasts: No masses palpated, no skin or nipple changes of concern   LAB RESULTS:  CMP     Component Value Date/Time   NA 143 07/17/2014 0853   K 3.9 07/17/2014 0853   CO2 27 07/17/2014 0853   GLUCOSE 95 07/17/2014 0853   BUN 14.8 07/17/2014 0853   CREATININE 0.7 07/17/2014 0853   CALCIUM 9.2 07/17/2014 0853   PROT 6.5 07/17/2014 0853   ALBUMIN 3.8 07/17/2014 0853   AST 27 07/17/2014 0853   ALT 29 07/17/2014 0853   ALKPHOS 100 07/17/2014 0853   BILITOT 0.56 07/17/2014 0853    INo results found for: SPEP, UPEP  Lab Results  Component Value Date   WBC 5.7 07/17/2014   NEUTROABS 1.9 07/17/2014   HGB 12.9 07/17/2014   HCT 38.2 07/17/2014   MCV 92.7 07/17/2014   PLT 233 07/17/2014      Chemistry      Component Value Date/Time   NA 143 07/17/2014 0853   K 3.9 07/17/2014 0853   CO2 27 07/17/2014 0853   BUN 14.8 07/17/2014 0853   CREATININE 0.7 07/17/2014 0853      Component Value Date/Time   CALCIUM  9.2 07/17/2014 0853   ALKPHOS 100 07/17/2014 0853   AST 27 07/17/2014 0853   ALT 29 07/17/2014 0853   BILITOT 0.56 07/17/2014 0853       No results found for: LABCA2  No components found for: LABCA125  No results for input(s): INR in the last 168 hours.  Urinalysis No results found for: COLORURINE, APPEARANCEUR, LABSPEC, PHURINE, GLUCOSEU, HGBUR, BILIRUBINUR, KETONESUR, PROTEINUR, UROBILINOGEN, NITRITE, LEUKOCYTESUR  STUDIES: Nm Pet Image Initial (pi) Skull Base To Thigh  07/19/2014   CLINICAL DATA:  Initial treatment strategy for non-Hodgkin's lymphoma.  EXAM: NUCLEAR MEDICINE PET SKULL BASE TO THIGH   TECHNIQUE: 6.8 mCi F-18 FDG was injected intravenously. Full-ring PET imaging was performed from the skull base to thigh after the radiotracer. CT data was obtained and used for attenuation correction and anatomic localization.  FASTING BLOOD GLUCOSE:  Value: 91 mg/dl  COMPARISON:  None  FINDINGS: NECK  Bilateral level 2 level 3 and level 5 lymph nodes which are enlarged. For example level 5 lymph node on the left measuring 10 mm (image 36, series 4). Level 5 lymph node on the right measuring 9 mm (image, 41 series 4). Multiple similar level 2 lymph nodes bilaterally. All of these lymph nodes in the neck have minimal metabolic activity less than or equals to mediastinal activity.  CHEST  Bilateral enlarged and minimally hypermetabolic axillary lymph nodes. For example left axillary lymph node measuring 10 mm short axis with SUV max equal 2.1. Similar right axial lymph nodes. Example 14 mm lymph node on image 57 series 4 with SUV max equal 1.8.  No mediastinal adenopathy. Review of the lung parenchyma demonstrates no suspicious nodularity.  ABDOMEN/PELVIS  Spleen is normal volume. No abnormal metabolic activity in liver. No significant retroperitoneal adenopathy. There are small left common iliac node. For example 10 mm lymph node on image 140, series 4. There small external iliac lymph nodes. For example lymph node at the left obturator space measuring 11 mm on image 165, series 4. These small pelvic lymph nodes also have low metabolic activity.  SKELETON  There is mild uptake within the marrow of the pelvis with mild focality within the right iliac bone on image 156 of the fused data set. This activity is slightly above mediastinal activity.  IMPRESSION: 1. Very low metabolic activity of bilateral cervical lymph nodes and bilateral axillary lymph nodes. The activity is less than or equal to mediastinal blood pool consistent with a low-grade lymphoma. 2. Mild iliac adenopathy. 3. Normal volume spleen. 4. Mild  diffuse metabolic activity within the marrow space of the pelvis. Due to the low metabolic activity of the lymph nodes, bone marrow biopsy may be warranted.   Electronically Signed   By: Suzy Bouchard M.D.   On: 07/19/2014 16:08    PATHOLOGY: Patient: Dana Butler, Dana Butler Collected: 06/29/2014 Client: [facility] Accession: [id number] Received: 06/29/2014 Armandina Gemma DOB: Jun 29, 1955 Age: 23 Gender: F Reported: 07/03/2014 [address] Patient Ph: [phone] MRN #: [id number] [place], [place] [address] Visit #: [id number].[place]-[facility]0 Chart #: Phone: [phone] Fax: CC: REPORT OF SURGICAL PATHOLOGY FINAL DIAGNOSIS Diagnosis Lymph node for lymphoma, left axillary - NON-HODGKIN'S LYMPHOMA CONSISTENT WITH MARGINAL ZONE TYPE - SEE ONCOLOGY TABLE. Microscopic Comment LYMPHOMA Histologic type: Non-Hodgkin's lymphoma consistent with marginal zone type. Grade (if applicable): Low grade. Flow cytometry: Monoclonal, lambda restricted B cell population expressing pan B cell antigens including CD20 but with lack of CD5, CD10, CD25, or CD103 expression ([id number]). Immunohistochemical stains: BCL-2, BCL-6,  CD3, CD5, CD10, CD20, CD21, CD23, CD43, CD79a, CD138, Cyclin D-1, kappa and lambda with appropriate controls. Touch preps/imprints: Predominance of small lymphoid cells with round nuclear contours, dense chromatin and small to inconspicuous nucleoli. Comments: Sections of the lymph nodes show effacement of the architecture by a predominant population of small lymphoid cells displaying round to slightly irregular nuclei, high nuclear cytoplasmic ratio, dense chromatin with clumping, and small to inconspicuous nucleoli. Admixed are only scattered larger lymphoid cells and histiocytes. A significant plasma cell component is not appreciated. The appearance is diffuse with lack of obvious atypical follicular structures or proliferation centers. However, a few very small and residual reactive  appearing germinal centers are seen. The proliferative process show scattered mitotic figures and no necrosis is seen. Flow cytometric analysis shows a monoclonal, lambda restricted B cell population expressing pan B cell antigens but with lack of CD5, CD10, CD25 or CD103 expression. Immunohistochemical stains show that the overwhelming majority of the lymphocytes consist of B cells as seen with CD20, CD79a, and CD23 associated with coexpression of CD43 but not CD5. The lymphocytes are also BCL-2 positive, but no significant staining with CD10, BCL-6, or Cyclin D-1 is seen. CD21 highlights numerous irregular dendritic networks throughout the lymph node. CD138 failed to show any significant plasma cell component. Kappa and lambda stains show polyclonal staining pattern in scattered plasma cells in the background. There is an admixed minor T cell component as seen with CD3. The histologic and immunophenotypic features are consistent with low grade non-Hodgkin's B cell lymphoma, marginal zone  FINAL for Dana Butler, Dana Butler ([id number]) Microscopic Comment(continued) Dana Greenhouse MD Pathologist, Electronic Signature (Case signed 07/03/2014)  FINAL for KEMIA, WENDEL [phone]) Specimen Table Lymphoid Associated Myeloid Associated Misc. As CD2 neg CD19 pos CD11c ND CD45 ND CD3 neg CD20 pos CD13 ND HLA-DR pos CD4 neg CD21 pos CD14 ND CD10 neg CD5 neg CD22 pos CD15 ND CD56/16 ND CD7 neg CD23 pos CD33 ND ZAP70 ND CD8 neg CD103 ND MPO ND CD34 ND CD25 ND FMC7 ND CD117 ND CD52 ND sKappa neg CD38 ND sLambda pos cKappa ND cLambda ND Gross REF. [id number] Interpretation was performed at [place], [place], [place] [address]. CLIA #: [id number],   ASSESSMENT: 59 y.o. 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 Dana Butler understands the specific type of lymphoma that she has corresponds to a mature B-cell. In general these lymphomas are indolent and not curable with our current knowledge base.  She will need a bone marrow biopsy. However, since we are considering a second opinion, I am deferring that so that it may be performed at [facility] if that would be helpful. Cytogenetic and FISH studies could be obtained at that time. Dana Butler may also be a good candidate for Dana Butler  We discussed "B" symptoms and lab work in detail and she understands that at present she has no hard indication to initiate treatment.  We also discussed treatment options. With indolent lymphomas my preference is to start with antibody treatment alone  and assuming a fair response is obtained, continue with observation to disease progression. This is in line with NCCN guidelines. Depending on the length of response the same treatment could be repeated or a more aggressive or alternative treatment would be tried. However treatment of non-Hodgkin's  lymphomas is changing rapidly and several new options have come in within the last year or so which is promising and also challenging.  For that reason, and because nodal MALT-like lymphomas are unusual I think it would be useful for Dana Butler to seek a second opinion and we will help arrange for her to see Dr. Ailene Ravel at [facility].   The patient has a good understanding of the overall plan. She agrees with it. She knows the goal of treatment in her case is control. She will call with any problems that may develop before her next visit here.  Chauncey Cruel, MD   07/22/2014 10:17 AM Medical Oncology and Hematology [facility] [address] [place], [place] [address] Tel. [phone]    Fax. [phone]

## 2014-07-20 NOTE — Progress Notes (Signed)
Checked in new pt with no financial concerns at this time.  Pt has my card for any billing questions or concerns. °

## 2014-07-20 NOTE — Telephone Encounter (Signed)
Called and left a message with [phone] schdule

## 2014-07-24 ENCOUNTER — Telehealth: Payer: Self-pay | Admitting: *Deleted

## 2014-07-24 NOTE — Telephone Encounter (Signed)
DOES PT. NEED TO CONTACT [facility] FOR AN APPOINTMENT? PER DR.MAGRINAT-HE CONTACT [facility] AND PROVIDED PT.'S CONTACT INFORMATION. IF PT. DOES NOT HEAR FROM [facility] BY THE MIDDLE OF NEXT WEEK CALL DR.MAGRINAT'S OFFICE AND HE WILL CONTACT [facility] AGAIN. NOTIFIED PT. SHE VOICES UNDERSTANDING.

## 2014-07-26 ENCOUNTER — Other Ambulatory Visit: Payer: Self-pay | Admitting: *Deleted

## 2014-07-26 ENCOUNTER — Telehealth: Payer: Self-pay | Admitting: *Deleted

## 2014-07-26 DIAGNOSIS — C8590 Non-Hodgkin lymphoma, unspecified, unspecified site: Secondary | ICD-10-CM

## 2014-07-26 NOTE — Telephone Encounter (Signed)
Patient called to say she has not heard from anyone at [facility] regarding an appointment with Dr. Frederic Jericho.  She advised to let us know if she had not.

## 2014-07-26 NOTE — Telephone Encounter (Signed)
This RN reviewed pt's call to [facility] regarding appointment at [facility] with Dr Ailene Ravel.  Per MD - he has communicated with Dr Hampton Abbot per email per above.  This RN called to Dr Allen Kell office and left a detailed message stating need for appointment on VM of New Patient Coordinator - Raelyn Ensign.  This RN also called pt and informed her of situation - this RN will be in contact with pt per follow up on referral.  No other needs at this time.

## 2014-08-08 ENCOUNTER — Telehealth: Payer: Self-pay | Admitting: *Deleted

## 2014-08-08 NOTE — Telephone Encounter (Signed)
"  Dr. Jana Hakim wanted me to have a lung biopsy at [facility].  I'm scheduled to see them on 08-29-2014.  Could Dr. Jana Hakim ask them to perform the biopsy the same day?  I plan to travel expansively and need to have this done."  Informed her the doctor/patient relationship needs to be established before a lung biopsy will be performed.  "How soon after the visit can the biopsy be scheduled?"  Advised she share her travel dates and plans with [facility] to have the Bx scheduled in a timely manner.

## 2014-08-09 ENCOUNTER — Other Ambulatory Visit: Payer: Self-pay | Admitting: Oncology

## 2014-08-09 NOTE — Telephone Encounter (Signed)
I tried to send this to [place], but apparently Epic only routes these noted MD-MD  If you let me know who she is scheduled to see at [facility] and when I can send them a note which MAY encourage them to get it all done in one day  Thanks!  G

## 2014-09-16 ENCOUNTER — Other Ambulatory Visit: Payer: Self-pay | Admitting: Oncology

## 2014-09-16 DIAGNOSIS — C8590 Non-Hodgkin lymphoma, unspecified, unspecified site: Secondary | ICD-10-CM

## 2014-09-16 NOTE — Progress Notes (Unsigned)
Hurt from Dr. Ok Edwards, tells me he agrees with the overall plan. Pathology reviewed the slides and feel there were no surprises, it is indeed a marginal zone. Because it is minimal disease and no symptoms just watching and waiting would be fine. He would just check counts, LDH and so. If there is any change then the plan could be changed.

## 2014-09-27 ENCOUNTER — Other Ambulatory Visit (HOSPITAL_BASED_OUTPATIENT_CLINIC_OR_DEPARTMENT_OTHER): Payer: BLUE CROSS/BLUE SHIELD

## 2014-09-27 DIAGNOSIS — C8514 Unspecified B-cell lymphoma, lymph nodes of axilla and upper limb: Secondary | ICD-10-CM | POA: Diagnosis not present

## 2014-09-27 DIAGNOSIS — C8590 Non-Hodgkin lymphoma, unspecified, unspecified site: Secondary | ICD-10-CM

## 2014-09-27 LAB — CBC WITH DIFFERENTIAL/PLATELET
BASO%: 1.2 % (ref 0.0–2.0)
Basophils Absolute: 0.1 10*3/uL (ref 0.0–0.1)
EOS ABS: 0.3 10*3/uL (ref 0.0–0.5)
EOS%: 4.2 % (ref 0.0–7.0)
HCT: 36.4 % (ref 34.8–46.6)
HEMOGLOBIN: 12.1 g/dL (ref 11.6–15.9)
LYMPH#: 3 10*3/uL (ref 0.9–3.3)
LYMPH%: 44.1 % (ref 14.0–49.7)
MCH: 30.9 pg (ref 25.1–34.0)
MCHC: 33.2 g/dL (ref 31.5–36.0)
MCV: 93.1 fL (ref 79.5–101.0)
MONO#: 0.3 10*3/uL (ref 0.1–0.9)
MONO%: 4.9 % (ref 0.0–14.0)
NEUT#: 3.1 10*3/uL (ref 1.5–6.5)
NEUT%: 45.6 % (ref 38.4–76.8)
Platelets: 227 10*3/uL (ref 145–400)
RBC: 3.91 10*6/uL (ref 3.70–5.45)
RDW: 14.9 % — ABNORMAL HIGH (ref 11.2–14.5)
WBC: 6.9 10*3/uL (ref 3.9–10.3)

## 2014-09-27 LAB — COMPREHENSIVE METABOLIC PANEL (CC13)
ALBUMIN: 3.6 g/dL (ref 3.5–5.0)
ALK PHOS: 94 U/L (ref 40–150)
ALT: 26 U/L (ref 0–55)
AST: 34 U/L (ref 5–34)
Anion Gap: 6 mEq/L (ref 3–11)
BILIRUBIN TOTAL: 0.53 mg/dL (ref 0.20–1.20)
BUN: 16.8 mg/dL (ref 7.0–26.0)
CO2: 28 mEq/L (ref 22–29)
Calcium: 9.1 mg/dL (ref 8.4–10.4)
Chloride: 107 mEq/L (ref 98–109)
Creatinine: 0.7 mg/dL (ref 0.6–1.1)
Glucose: 97 mg/dl (ref 70–140)
POTASSIUM: 4.5 meq/L (ref 3.5–5.1)
Sodium: 141 mEq/L (ref 136–145)
TOTAL PROTEIN: 6.6 g/dL (ref 6.4–8.3)

## 2014-09-27 LAB — LACTATE DEHYDROGENASE (CC13): LDH: 168 U/L (ref 125–245)

## 2014-10-01 LAB — BETA 2 MICROGLOBULIN, SERUM: Beta-2 Microglobulin: 2.91 mg/L — ABNORMAL HIGH (ref <=2.51)

## 2014-10-04 ENCOUNTER — Ambulatory Visit (HOSPITAL_BASED_OUTPATIENT_CLINIC_OR_DEPARTMENT_OTHER): Payer: BLUE CROSS/BLUE SHIELD | Admitting: Oncology

## 2014-10-04 VITALS — BP 121/64 | HR 69 | Temp 97.7°F | Resp 18 | Ht 62.0 in | Wt 129.7 lb

## 2014-10-04 DIAGNOSIS — C8590 Non-Hodgkin lymphoma, unspecified, unspecified site: Secondary | ICD-10-CM

## 2014-10-04 DIAGNOSIS — M8580 Other specified disorders of bone density and structure, unspecified site: Secondary | ICD-10-CM | POA: Insufficient documentation

## 2014-10-04 DIAGNOSIS — C8514 Unspecified B-cell lymphoma, lymph nodes of axilla and upper limb: Secondary | ICD-10-CM

## 2014-10-04 NOTE — Addendum Note (Signed)
Addended by: Prentiss Bells on: 10/04/2014 06:58 PM   Modules accepted: Medications

## 2014-10-04 NOTE — Progress Notes (Signed)
[place]  [phone] [phone]     ID: [facility] DOB: 12-08-1955  MR#: [id number]  CLE#:[id number]  Patient Care Team: Cari Caraway, MD as PCP - General (Family Medicine) Jodi Marble, MD as Consulting Physician (Otolaryngology) Armandina Gemma, MD as Consulting Physician (General Surgery) PCP: Cari Caraway, MD OTHER MD: Marijo Conception. Ok Edwards MD  CHIEF COMPLAINT: nodal marginal zone non-Hodgkin's lymphoma  CURRENT TREATMENT: Observation   HISTORY OF PRESENT ILNESS: From the 07/20/2014 intake note:  Dana [facility] had some "shooting pains across her neck" earliy in 2016, and saw ENT regarding this. There was a question of mild cervical adenopathy noted at that time. More recently, she underwent screening mammography 05/30/2014 at [facility]. This suggested enlarged lymph nodes in the left axilla and the patient was recalled for left axillary ultrasound 06/08/2014. There were 2 enlarged lymph nodes, one measuring 2.4 cm, retaining its fatty hilum, but with the cortex measuring 4.5 mm. Posterior to that there was a second lymph node measuring 3.1 cm, with no internal fat. There was no mass noted in the upper outer quadrant of the left breast and review of the earlier mammogram showed no mammographic abnormality in either breast.  Biopsy of one of the left axillary lymph nodes 06/19/2014 showed ([id number]) a relatively monomorphic population of small round to slightly irregular lymphocytes, with dense chromatin and inconspicuous nucleoli. Flow cytometry ([id number]) showed a lambda restricted B-cell population expressing CD20, but not CD5, CD10 or cycling D1. Ki-67 was less than 10%. A preliminary diagnosis of B-cell non-Hodgkin's lymphoma, low-grade, was made..  On 06/29/2014 the patient underwent left axillary excisional biopsy with a total of 4 lymph nodes removed. The pathology from this more extensive sample showed ([id number]) effacement of the lymph  node architecture by the lymphoma cells previously described, with only scattered larger lymphoid cells and histiocytes and no significant plasma cell component. There were scattered mitotic figures and no necrosis. Repeat flow cytometry showed most of the lymphocytes were CD20, CD23 and CD79a positive, with coexpression of CD43 but not CD5, CD10, BCL-6 or cyclin D1. The cells were BCL-2 positive. Overall it was pathology's impression that this was a low-grade B-cell non-Hodgkin's lymphoma, marginal zone type.  On 07/19/2014 [facility] underwent PET scanning which showed bilateral cervical adenopathy, bilateral axillary adenopathy, some small left common iliac lymph nodes and a normal spleen. There was very low metabolic activity in all the lymph nodes noted and similarly in the pelvic marrow suggesting possible marrow involvement.  The patient's subsequent history is as detailed below  INTERVAL HISTORY: [facility] returns today for follow-up of her nodal marginal zone, B-cell non-Hodgkin's lymphoma accompanied by her husband Zan. Since her last visit here she was evaluated at [facility] where the pathologic diagnosis was confirmed by a review of the original slides. Lab work obtained there included hepatitis C antibodies, hepatitis B core and surface antibodies as well as hepatitis B surface antigen and HIV antigens/antibody, all negative. Dr. Octaviano Batty feeling was that a watch and wait approach was probably best since [name] remains entirely asymptomatic and there are no cytopenias. If and when treatment is considered, he suggested performing a bone marrow biopsy prior to therapy and consideration of rituximab. There are currently no studies at [facility] for this type of lymphoma. She did sign up for the [facility] protocol.  REVIEW OF SYSTEMS: [facility] is doing "great" she does yoga and walking as her main form of exercise. She has no "B" symptoms, no adenopathy that she  is aware of, no rash or pruritus. As what her worse problem is she  has no worse problem. A detailed review of systems today was entirely negative  PAST MEDICAL HISTORY: Past Medical History  Diagnosis Date  . Medical history non-contributory     PAST SURGICAL HISTORY: Past Surgical History  Procedure Laterality Date  . Diagnostic laparoscopy  2005    BSO  . Abdominal hysterectomy    . Colonoscopy    . Axillary lymph node biopsy Left 06/29/2014    Procedure: AXILLARY LYMPH NODE BIOPSY;  Surgeon: Armandina Gemma, MD;  Location: [place];  Service: General;  Laterality: Left;    FAMILY HISTORY No family history on file. The patient's father died at the age of 3, the patient's mother at age 73. The patient has 2 brothers, no sisters. [facility]'s maternal grandmother had a diagnosis of leukemia, type unknown, in her 19s. A maternal cousin has a history of lymphoma.  GYNECOLOGIC HISTORY:  No LMP recorded. Patient has had a hysterectomy. Menarche age 35, first live birth age 75, the patient is GX P3. She underwent a hysterectomy with bilateral salpingo-oophorectomy at age 30. She continues on hormone replacement.  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 Her husband "Zan" just retired from anesthesia July 2015. (Incidentally he was the anesthesiologist during my prostatectomy 2004). Their children are currently age 78, 27, and 36. There are no grandchildren. The patient attends [employer] [place] [facility]   HEALTH MAINTENANCE: History  Substance Use Topics  . Smoking status: Never Smoker   . Smokeless tobacco: Not on file  . Alcohol Use: No     Colonoscopy:  PAP:  Bone density:  Lipid panel:  No Known Allergies  Current Outpatient Prescriptions  Medication Sig Dispense Refill  . estradiol (VIVELLE-DOT) 0.05 MG/24HR patch Place 1 patch onto the skin 2 (two) times a week.    [name] HYDROcodone-acetaminophen (NORCO/VICODIN) 5-325 MG per tablet Take 1-2 tablets by mouth  every 4 (four) hours as needed for moderate pain. 20 tablet 0  . Multiple Vitamins-Minerals (MULTIVITAMIN WITH MINERALS) tablet Take 1 tablet by mouth daily.     No current facility-administered medications for this visit.    OBJECTIVE: Middle-aged white woman who appears well Filed Vitals:   10/04/14 1633  BP: 121/64  Pulse: 69  Temp: 97.7 F (36.5 C)  Resp: 18     Body mass index is 23.72 kg/(m^2).    ECOG FS:0 - Asymptomatic  Sclerae unicteric, pupils round and equal Oropharynx clear and moist-- no thrush or other lesions No cervical or supraclavicular adenopathy Lungs no rales or rhonchi Heart regular rate and rhythm Abd soft, nontender, positive bowel sounds, no palpable splenomegaly MSK no focal spinal tenderness, no upper extremity lymphedema Neuro: nonfocal, well oriented, appropriate affect Breasts: Deferred, no axillary or inguinal adenopathy   LAB RESULTS: NB: Hepatitis antibody studies obtained at [facility] 08/29/2014 were negative for hepatitis C antibody, hepatitis B core antibody, hepatitis B surface antigen, hepatitis B surface antibody, or HIV antigen or antibody  Results for Dana, Butler (MRN [id number]) as of 10/04/2014 08:04  Ref. Range 07/17/2014 08:53 09/27/2014 15:16  LDH Latest Ref Range: 125-245 U/L 169 168   Results for Dana, TIGERT (MRN [id number]) as of 10/04/2014 08:04  Ref. Range 07/17/2014 08:53 09/27/2014 15:16  Beta-2 Microglobulin Latest Ref Range: <=2.51 mg/L 2.85 (H) 2.91 (H)   CMP  Component Value Date/Time   NA 141 09/27/2014 1516   K 4.5 09/27/2014 1516   CO2 28 09/27/2014 1516   GLUCOSE 97 09/27/2014 1516   BUN 16.8 09/27/2014 1516   CREATININE 0.7 09/27/2014 1516   CALCIUM 9.1 09/27/2014 1516   PROT 6.6 09/27/2014 1516   ALBUMIN 3.6 09/27/2014 1516   AST 34 09/27/2014 1516   ALT 26 09/27/2014 1516   ALKPHOS 94 09/27/2014 1516   BILITOT 0.53 09/27/2014 1516    INo results found for: SPEP, UPEP  Lab Results  Component Value  Date   WBC 6.9 09/27/2014   NEUTROABS 3.1 09/27/2014   HGB 12.1 09/27/2014   HCT 36.4 09/27/2014   MCV 93.1 09/27/2014   PLT 227 09/27/2014      Chemistry      Component Value Date/Time   NA 141 09/27/2014 1516   K 4.5 09/27/2014 1516   CO2 28 09/27/2014 1516   BUN 16.8 09/27/2014 1516   CREATININE 0.7 09/27/2014 1516      Component Value Date/Time   CALCIUM 9.1 09/27/2014 1516   ALKPHOS 94 09/27/2014 1516   AST 34 09/27/2014 1516   ALT 26 09/27/2014 1516   BILITOT 0.53 09/27/2014 1516       No results found for: LABCA2  No components found for: LABCA125  No results for input(s): INR in the last 168 hours.  Urinalysis No results found for: COLORURINE, APPEARANCEUR, LABSPEC, PHURINE, GLUCOSEU, HGBUR, BILIRUBINUR, KETONESUR, PROTEINUR, UROBILINOGEN, NITRITE, LEUKOCYTESUR  STUDIES: No results found.  ASSESSMENT: 59 y.o. [place] woman s/p left axillary lymph node biopsy 06/29/2014 for a B-cell non-Hodgkin's lymphoma, consistent with marginal zone nodal lymphoma, advanced stage  (1) signed up for [facility] 08/29/2014  (2) consider rituximab if symptoms or cytopenias develop  (a) negative hepatitis studies obtained at [facility] 08/29/2014  PLAN: Kye and her husband Zan were very appreciative of the visit at [facility] and I found it very helpful. It confirmed the unusual diagnosis and supported the overall plan. We discussed all that again in detail today and we will be doing labwork every 3 months with visits every 6 months for the next year or 2, after which we may do lab work every 3 months but visits only once a year.  We will repeat a PET scan in December and possibly again December of next year  Carlyle had many questions regarding diet. We discussed the many uncertainties in nutrition data due to the fact that we cannot do strict randomized diet and exercise studies (except possibly in [place]). She is going to be meeting with a dietitian next week. I suggested she read  Legrand Como Pollan's "The Omnivore's Dilemma" for a start. In general she should eat fresh foods, mostly vegetables, and with cancer specifically in mind I would limit the "whites" (rice, pasta, bread, potatoes), which can theoretically lead to a subclinical inflammatory/increased growth factor metabolic state.  The patient has a good understanding of the overall plan. She agrees with it. She knows the goal of treatment in her case is control. She will call with any problems that may develop before her next visit here.  Chauncey Cruel, MD   10/04/2014 5:26 PM Medical Oncology and Hematology [facility] [address] [place], [place] [address] Tel. [phone]    Fax. [phone]

## 2014-10-05 ENCOUNTER — Telehealth: Payer: Self-pay | Admitting: Oncology

## 2014-10-05 NOTE — Telephone Encounter (Signed)
Mailed calendar for [employer]

## 2014-10-09 ENCOUNTER — Encounter: Payer: Self-pay | Admitting: Family Medicine

## 2014-10-09 ENCOUNTER — Ambulatory Visit (INDEPENDENT_AMBULATORY_CARE_PROVIDER_SITE_OTHER): Payer: BLUE CROSS/BLUE SHIELD | Admitting: Family Medicine

## 2014-10-09 VITALS — Ht 62.0 in | Wt 129.0 lb

## 2014-10-09 DIAGNOSIS — F5002 Anorexia nervosa, binge eating/purging type: Secondary | ICD-10-CM

## 2014-10-09 DIAGNOSIS — M8580 Other specified disorders of bone density and structure, unspecified site: Secondary | ICD-10-CM

## 2014-10-09 DIAGNOSIS — F5081 Binge eating disorder: Secondary | ICD-10-CM

## 2014-10-09 NOTE — Patient Instructions (Signed)
-   Read Eating in the Light of the Moon by Romie Minus with a highlighter in hand.    - Email [email or web address] to let me know how you like the book.    - Eat at least 3 REAL meals and 1-2 snacks per day.  Aim for no more than 5 hours between eating.  Eat a REAL breakfast within one hour of getting up.   - A REAL meal includes a source of protein, carb, and veg's and/or fruit.    - OR:  Would you serve this to a gurst in your home, and call it a meal?  - Best carb's:  Those that have fiber, at least 4 grams per serving, e.g., beans, lentils, cooked whole grains (steel-cut oats, quinoa, barley), sweet potato, winter squash, beets.     1 portion of grains:  1/2-1 c cooked.   - Anti-inflammatory foods:  Garlic, onion, beets, green leafy veg's, cold-water (fatty) fish, (grass-fed beef).    - Sugar and sweets:    - Minimize added sugars as much as possible.  If you get more protein (SOME at each meal), you may find less inclination for sweets/carb's.    - TASTE PREFERENCES ARE LEARNED.  This means that it will get easier to choose foods you know are good for you if you are exposed to them enough.   - Alternatives:  Use more fruits, mixed nuts and dried fruit; nut butter (oil on top) on plain rice cakes with a few dried fruits on top; granola with yogurt/kefir. Also: Mix plain yogurt     with sweetened to help change taste preference toward less sweet.   - Protein RDA for adults = 0.8 g/kg, which means you need at least 47 grams per day.  (Meat, fish = 7 g per oz; 1 egg/1 oz cheese = 7 grams; 1/2 c beans = 7 grams.)

## 2014-10-09 NOTE — Progress Notes (Signed)
Medical Nutrition Therapy:  Appt start time: 1630 end time:  1730.  Assessment:  Primary concerns today: improved nutrition, including as related to binge eating as well as dx of non-hodgkins lymphoma.  In addition, [facility] has been diagnosed with osteopenia, and wants to make sure she is eating to promote bone health.  She feels her binge eating problem comes and goes.  She is currently eating Mindful Eating, Mindful Life by Matthias Hughs.  She is usually hungry late morning and ~3 PM.  After 8 PM is a "danger time" when [facility] often wants something sweet.  She has tried to keep sweets out of the house, but usually has a sweet dessert ~2 X wk, and has recently been drinking a sweet coffee drink daily.  Both 24-hr recall and diet hx suggest that [facility]'s protein intake is marginal.  She also gets a very minimal amount of fat in her diet.    Learning Readiness: Change in progress  Barriers to learning/adherence to lifestyle change: [facility]'s inclination toward sweets.    Usual eating pattern includes 3 meals and 2 snacks per day. Frequent foods and beverages include veg's, sweet coffee drinks (1/day), chx, salads, seafood/fish (1 X wk).  Avoided foods include pork, most beef, most dairy products.  Eats out 1-2 X wk.  Usual physical activity includes walking 40 min 6 X wk.  24-hr recall: (Up at 5:45 AM) B (6 AM)-   Tea with honey-ginger 6:45 Walk  Snk (7:30 AM)-Few raspberries, water Snk (10 AM)-  1/4 c unsalted peanuts L (11:30 PM)-  2 c spinach, 1/3 c sliced strawber's Snk (3 PM)-  Coffee drink (150 kcal) D (7 PM)-  Caesar salad, 1/3 c croutons, tom's, cuc's, parmesan, 2 tbsp drsng, sparkling water Snk (8:30)-  1/2 c Kind granola, 1/4 c pecans, 1/3 c vanilla yogurt Typical day? Yes.    Progress Towards Goal(s):  In progress.   Nutritional Diagnosis:  NB-1.5 Disordered eating pattern As related to restrictive and binge-eating behaviors.  As evidenced by limited intake of fat, protein, and carb  foods most days.    Intervention:  Nutrition education.  Handouts given during visit include:  AVS  Demonstrated degree of understanding via:  Teach Back   Monitoring/Evaluation:  Dietary intake, exercise, and body weight prn.

## 2015-01-03 ENCOUNTER — Telehealth: Payer: Self-pay

## 2015-01-03 ENCOUNTER — Other Ambulatory Visit: Payer: BLUE CROSS/BLUE SHIELD

## 2015-01-03 NOTE — Telephone Encounter (Signed)
Patient called the after hours line to cancel lab draw appt for today.  Patient states that she will call to reschedule.

## 2015-01-10 ENCOUNTER — Other Ambulatory Visit: Payer: BLUE CROSS/BLUE SHIELD

## 2015-04-04 ENCOUNTER — Other Ambulatory Visit (HOSPITAL_BASED_OUTPATIENT_CLINIC_OR_DEPARTMENT_OTHER): Payer: BLUE CROSS/BLUE SHIELD

## 2015-04-04 DIAGNOSIS — M8580 Other specified disorders of bone density and structure, unspecified site: Secondary | ICD-10-CM

## 2015-04-04 DIAGNOSIS — C8590 Non-Hodgkin lymphoma, unspecified, unspecified site: Secondary | ICD-10-CM

## 2015-04-04 LAB — TECHNOLOGIST REVIEW

## 2015-04-04 LAB — COMPREHENSIVE METABOLIC PANEL
ALBUMIN: 4 g/dL (ref 3.5–5.0)
ALK PHOS: 137 U/L (ref 40–150)
ALT: 22 U/L (ref 0–55)
ANION GAP: 11 meq/L (ref 3–11)
AST: 31 U/L (ref 5–34)
BUN: 15.4 mg/dL (ref 7.0–26.0)
CALCIUM: 9.3 mg/dL (ref 8.4–10.4)
CO2: 23 meq/L (ref 22–29)
CREATININE: 0.8 mg/dL (ref 0.6–1.1)
Chloride: 108 mEq/L (ref 98–109)
EGFR: 77 mL/min/{1.73_m2} — AB (ref >90)
Glucose: 93 mg/dl (ref 70–140)
Potassium: 4.2 mEq/L (ref 3.5–5.1)
Sodium: 142 mEq/L (ref 136–145)
TOTAL PROTEIN: 7.2 g/dL (ref 6.4–8.3)
Total Bilirubin: 0.35 mg/dL (ref 0.20–1.20)

## 2015-04-04 LAB — CBC WITH DIFFERENTIAL/PLATELET
BASO%: 0.8 % (ref 0.0–2.0)
Basophils Absolute: 0.1 10*3/uL (ref 0.0–0.1)
EOS%: 4.8 % (ref 0.0–7.0)
Eosinophils Absolute: 0.5 10*3/uL (ref 0.0–0.5)
HEMATOCRIT: 38.8 % (ref 34.8–46.6)
HGB: 13.1 g/dL (ref 11.6–15.9)
LYMPH%: 62.4 % — AB (ref 14.0–49.7)
MCH: 31.3 pg (ref 25.1–34.0)
MCHC: 33.7 g/dL (ref 31.5–36.0)
MCV: 92.8 fL (ref 79.5–101.0)
MONO#: 0.3 10*3/uL (ref 0.1–0.9)
MONO%: 3.5 % (ref 0.0–14.0)
NEUT#: 2.8 10*3/uL (ref 1.5–6.5)
NEUT%: 28.5 % — AB (ref 38.4–76.8)
PLATELETS: 251 10*3/uL (ref 145–400)
RBC: 4.17 10*6/uL (ref 3.70–5.45)
RDW: 15.6 % — ABNORMAL HIGH (ref 11.2–14.5)
WBC: 9.8 10*3/uL (ref 3.9–10.3)
lymph#: 6.1 10*3/uL — ABNORMAL HIGH (ref 0.9–3.3)

## 2015-04-04 LAB — LACTATE DEHYDROGENASE: LDH: 197 U/L (ref 125–245)

## 2015-04-08 LAB — IGG, IGA, IGM
IGA: 230 mg/dL (ref 69–380)
IGG (IMMUNOGLOBIN G), SERUM: 1060 mg/dL (ref 690–1700)
IgM, Serum: 65 mg/dL (ref 52–322)

## 2015-04-08 LAB — BETA 2 MICROGLOBULIN, SERUM: Beta-2 Microglobulin: 3.47 mg/L — ABNORMAL HIGH (ref <=2.51)

## 2015-04-09 ENCOUNTER — Other Ambulatory Visit: Payer: Self-pay | Admitting: Oncology

## 2015-04-09 ENCOUNTER — Ambulatory Visit (HOSPITAL_COMMUNITY)
Admission: RE | Admit: 2015-04-09 | Discharge: 2015-04-09 | Disposition: A | Discharge status: Home / Self Care | Payer: BLUE CROSS/BLUE SHIELD | Source: Ambulatory Visit | Attending: Oncology | Admitting: Oncology

## 2015-04-09 DIAGNOSIS — C8590 Non-Hodgkin lymphoma, unspecified, unspecified site: Secondary | ICD-10-CM | POA: Diagnosis not present

## 2015-04-09 DIAGNOSIS — M8580 Other specified disorders of bone density and structure, unspecified site: Secondary | ICD-10-CM | POA: Diagnosis not present

## 2015-04-09 LAB — GLUCOSE, CAPILLARY: GLUCOSE-CAPILLARY: 94 mg/dL (ref 65–99)

## 2015-04-09 MED ORDER — FLUDEOXYGLUCOSE F - 18 (FDG) INJECTION
6.2000 | Freq: Once | INTRAVENOUS | Status: AC | PRN
  Administered 2015-04-09: 6.2 via INTRAVENOUS

## 2015-04-11 ENCOUNTER — Ambulatory Visit (HOSPITAL_BASED_OUTPATIENT_CLINIC_OR_DEPARTMENT_OTHER): Payer: BLUE CROSS/BLUE SHIELD | Admitting: Oncology

## 2015-04-11 ENCOUNTER — Telehealth: Payer: Self-pay | Admitting: Oncology

## 2015-04-11 VITALS — BP 123/85 | HR 82 | Temp 97.8°F | Resp 18 | Ht 62.0 in | Wt 129.6 lb

## 2015-04-11 DIAGNOSIS — C8580 Other specified types of non-Hodgkin lymphoma, unspecified site: Secondary | ICD-10-CM

## 2015-04-11 DIAGNOSIS — C8588 Other specified types of non-Hodgkin lymphoma, lymph nodes of multiple sites: Secondary | ICD-10-CM | POA: Diagnosis not present

## 2015-04-11 NOTE — Progress Notes (Signed)
Dana Butler  [phone] Fax:([phone]     ID: Dana Butler DOB: 1955/05/18  MR#: [id number]  GYF#:[id number]  Patient Care Team: Cari Caraway, MD as PCP - General (Family Medicine) Jodi Marble, MD as Consulting Physician (Otolaryngology) Armandina Gemma, MD as Consulting Physician ([facility]) Kennith Center, RD as Dietitian (Family Medicine) PCP: Cari Caraway, MD OTHER MD: Barrie Dunker MD  CHIEF COMPLAINT: nodal marginal zone non-Hodgkin's lymphoma  CURRENT TREATMENT: Observation   HISTORY OF PRESENT ILNESS: From the 07/20/2014 intake note:  Dana Butler had some "shooting pains across her neck" earliy in 2016, and saw ENT regarding this. There was a question of mild cervical adenopathy noted at that time. More recently, she underwent screening mammography 05/30/2014 at the [facility]. This suggested enlarged lymph nodes in the left axilla and the patient was recalled for left axillary ultrasound 06/08/2014. There were 2 enlarged lymph nodes, one measuring 2.4 cm, retaining its fatty hilum, but with the cortex measuring 4.5 mm. Posterior to that there was a second lymph node measuring 3.1 cm, with no internal fat. There was no mass noted in the upper outer quadrant of the left breast and review of the earlier mammogram showed no mammographic abnormality in either breast.  Biopsy of one of the left axillary lymph nodes 06/19/2014 showed ([id number]) a relatively monomorphic population of small round to slightly irregular lymphocytes, with dense chromatin and inconspicuous nucleoli. Flow cytometry ([id number]) showed a lambda restricted B-cell population expressing CD20, but not CD5, CD10 or cycling D1. Ki-67 was less than 10%. A preliminary diagnosis of B-cell non-Hodgkin's lymphoma, low-grade, was made..  On 06/29/2014 the patient underwent left axillary excisional biopsy with a total of 4 lymph nodes removed. The pathology from this more extensive  sample showed ([id number]) effacement of the lymph node architecture by the lymphoma cells previously described, with only scattered larger lymphoid cells and histiocytes and no significant plasma cell component. There were scattered mitotic figures and no necrosis. Repeat flow cytometry showed most of the lymphocytes were CD20, CD23 and CD79a positive, with coexpression of CD43 but not CD5, CD10, BCL-6 or cyclin D1. The cells were BCL-2 positive. Overall it was pathology's impression that this was a low-grade B-cell non-Hodgkin's lymphoma, marginal zone type.  On 07/19/2014 Dana Butler underwent PET scanning which showed bilateral cervical adenopathy, bilateral axillary adenopathy, some small left common iliac lymph nodes and a normal spleen. There was very low metabolic activity in all the lymph nodes noted and similarly in the pelvic marrow suggesting possible marrow involvement.  The patient's subsequent history is as detailed below  INTERVAL HISTORY: Dana Butler returns today for further discussion an evaluation of her nodal marginal zone, B-cell non-Hodgkin's lymphoma accompanied by her husband Zan.  Interval history has been generally unremarkable. She feels "great". She exercises regularly chiefly by walking, doing weights, and doing yoga. She has an excellent diet by her own account. She has had no change in weight, no unusual fatigue, no fever or drenching sweats, no rash, no bleeding , and no intercurrent infections. She has not noted any significant increase in her palpable adenopathy  REVIEW OF SYSTEMS:  A detailed review of systems today was stable  PAST MEDICAL HISTORY: Past Medical History  Diagnosis Date  . Medical history non-contributory     PAST SURGICAL HISTORY: Past Surgical History  Procedure Laterality Date  . Diagnostic laparoscopy  2005    BSO  . Abdominal hysterectomy    . Colonoscopy    .  Axillary lymph node biopsy Left 06/29/2014    Procedure: AXILLARY LYMPH NODE BIOPSY;   Surgeon: Armandina Gemma, MD;  Location: [facility];  Service: General;  Laterality: Left;    FAMILY HISTORY No family history on file. The patient's father died at the age of 42, the patient's mother at age 6. The patient has 2 brothers, no sisters. Dana Butler's maternal grandmother had a diagnosis of leukemia, type unknown, in her 45s. A maternal cousin has a history of lymphoma.  GYNECOLOGIC HISTORY:  No LMP recorded. Patient has had a hysterectomy. Menarche age 53, first live birth age 2, the patient is GX P3. She underwent a hysterectomy with bilateral salpingo-oophorectomy at age 57. She continues on hormone replacement.  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 Her husband "Zan" just retired from anesthesia July 2015. (Incidentally he was the anesthesiologist during my prostatectomy 2004). Their children are currently age 43, 84, and 72. There are no grandchildren. The patient attends [employer]: [place] place   HEALTH MAINTENANCE: Social History  Substance Use Topics  . Smoking status: Never Smoker   . Smokeless tobacco: Not on file  . Alcohol Use: No     Colonoscopy:  PAP:  Bone density:  Lipid panel:  Allergies  Allergen Reactions  . Latex Rash  . Tape Rash    Current Outpatient Prescriptions  Medication Sig Dispense Refill  . ALPRAZolam (XANAX) 0.5 MG tablet     . calcium carbonate (OS-CAL) 1250 (500 CA) MG chewable tablet Chew by mouth.    . Cholecalciferol (VITAMIN D3) 5000 UNITS TABS Take by mouth.    . estradiol (VIVELLE-DOT) 0.05 MG/24HR patch Place 1 patch onto the skin 2 (two) times a week.    . Fish Oil OIL Take by mouth.    . Glucosamine-Chondroitin (GLUCOSAMINE CHONDR COMPLEX PO) Take by mouth.    [name] ibuprofen (ADVIL,MOTRIN) 200 MG tablet Take 200 mg by mouth.    . Multiple Vitamins-Minerals (MULTIVITAMIN WITH MINERALS) tablet Take 1 tablet by mouth daily.    . Zaleplon  (SONATA PO) Take by mouth.     No current facility-administered medications for this visit.    OBJECTIVE: Middle-aged white woman  In no acute distress Filed Vitals:   04/11/15 1202  BP: 123/85  Pulse: 82  Temp: 97.8 F (36.6 C)  Resp: 18     Body mass index is 23.7 kg/(m^2).    ECOG FS:0 - Asymptomatic  Sclerae unicteric, EOMs intact Oropharynx clear, dentition in good repair No cervical  Adenopathy. Mild right supraclavicular adenopathy. 1 cm palpable right axillary adenopathy. What I am feeling in the left axilla is most likely her seroma. It measures between 2 and 3 cm. I do not palpate any inguinal adenopathy. Lungs no rales or rhonchi Heart regular rate and rhythm Abd soft, nontender, positive bowel sounds MSK no focal spinal tenderness, no upper extremity lymphedema Neuro: nonfocal, well oriented, appropriate affect Breasts:  deferred      LAB RESULTS:  Results for TIOMBE, TOMEO (MRN [id number]) as of 04/11/2015 17:17  Ref. Range 07/17/2014 08:53 09/27/2014 15:16 04/04/2015 13:23  Beta-2 Microglobulin Latest Ref Range: <=2.51 mg/L 2.85 (H) 2.91 (H) 3.47 (H)  Results for ZAYANA, SALVADOR (MRN [id number]) as of 04/11/2015 17:17  Ref. Range 07/17/2014 08:53 09/27/2014 15:16 04/04/2015 13:23  lymph# Latest Ref Range: 0.9-3.3 10e3/uL 3.3 3.0 6.1 (H)  Results for VERNETTA, DIZDAREVIC (MRN [id number])  as of 04/11/2015 17:17  Ref. Range 07/17/2014 08:53 09/27/2014 15:16 04/04/2015 13:23  LDH Latest Ref Range: 125-245 U/L 169 168 197   CMP     Component Value Date/Time   NA 142 04/04/2015 1323   K 4.2 04/04/2015 1323   CO2 23 04/04/2015 1323   GLUCOSE 93 04/04/2015 1323   BUN 15.4 04/04/2015 1323   CREATININE 0.8 04/04/2015 1323   CALCIUM 9.3 04/04/2015 1323   PROT 7.2 04/04/2015 1323   ALBUMIN 4.0 04/04/2015 1323   AST 31 04/04/2015 1323   ALT 22 04/04/2015 1323   ALKPHOS 137 04/04/2015 1323   BILITOT 0.35 04/04/2015 1323    INo results found for: SPEP, UPEP  Lab Results    Component Value Date   WBC 9.8 04/04/2015   NEUTROABS 2.8 04/04/2015   HGB 13.1 04/04/2015   HCT 38.8 04/04/2015   MCV 92.8 04/04/2015   PLT 251 04/04/2015      Chemistry      Component Value Date/Time   NA 142 04/04/2015 1323   K 4.2 04/04/2015 1323   CO2 23 04/04/2015 1323   BUN 15.4 04/04/2015 1323   CREATININE 0.8 04/04/2015 1323      Component Value Date/Time   CALCIUM 9.3 04/04/2015 1323   ALKPHOS 137 04/04/2015 1323   AST 31 04/04/2015 1323   ALT 22 04/04/2015 1323   BILITOT 0.35 04/04/2015 1323       No results found for: LABCA2  No components found for: LABCA125  No results for input(s): INR in the last 168 hours.  Urinalysis No results found for: COLORURINE, APPEARANCEUR, LABSPEC, PHURINE, GLUCOSEU, HGBUR, BILIRUBINUR, KETONESUR, PROTEINUR, UROBILINOGEN, NITRITE, LEUKOCYTESUR  STUDIES: Nm Pet Image Restage (ps) Whole Body  04/09/2015  CLINICAL DATA:  Subsequent treatment strategy for non-Hodgkin's lymphoma. Restaging examination. Originally diagnosed in 2015. EXAM: NUCLEAR MEDICINE PET WHOLE BODY TECHNIQUE: 6.2 mCi F-18 FDG was injected intravenously. Full-ring PET imaging was performed from the vertex to the feet after the radiotracer. CT data was obtained and used for attenuation correction and anatomic localization. FASTING BLOOD GLUCOSE:  Value:  94 mg/dl COMPARISON:  PET-CT 07/19/2014 FINDINGS: Head/Neck: Several borderline enlarged lymph nodes are noted in the cervical regions bilaterally, these demonstrate only low-level metabolic activity. This is most notable in the right level 2A lymph nodes which measure up to 9 mm in short axis (SUVmax = 2.4). Chest: Numerous enlarged bilateral axillary and subpectoral lymph nodes are again noted, generally similar to slightly increased in size compared to the prior examination. The largest of these measure up to 1.6 cm (previously 1 cm) in the left axilla, 1.5 cm (previously 1.4 cm) in the right axilla, and 1.3 cm  (previously 1.2 cm) in the left subpectoral region. These all demonstrate low-level metabolic activity (SUVmax = 2.2). Postoperative changes of left axillary lymph node dissection are again noted, and there is a resolving postoperative low-attenuation fluid collection which currently measures 2.2 x 1.9 cm, presumably a resolving seroma. No hypermetabolic mediastinal or hilar nodes. No suspicious pulmonary nodules on the CT scan. Heart size is normal. There is no significant pericardial fluid, thickening or pericardial calcification. No acute consolidative airspace disease. No pleural effusions. Abdomen/Pelvis: Again noted are multiple borderline enlarged and mildly enlarged abdominal and pelvic lymph nodes. The largest of these are in the right common iliac nodal station measuring up to 1.3 cm in short axis (image 135 of series 4) demonstrating only low-level metabolic activity (SUVmax = 2.1). These lymph nodes generally appear slightly  larger than the prior examination from 07/19/2014. For example, the right common iliac lymph node previously measured only 1 cm in short axis. No abnormal hypermetabolic activity within the liver, pancreas, adrenal glands, or spleen. Multiple borderline enlarged and mildly enlarged bilateral inguinal lymph nodes measure up to 1 cm in short axis and demonstrates only low-level metabolic activity (SUVmax = 1.7). Spleen is normal in size, and demonstrates lower metabolic activity in the hepatic parenchyma. No significant volume of ascites. No pneumoperitoneum. Skeleton: No focal hypermetabolic activity to suggest skeletal metastasis. Extremities: No hypermetabolic activity to suggest metastasis. IMPRESSION: 1. Today's study again demonstrates multiple enlarged lymph nodes throughout the neck, chest, abdomen and pelvis, which are generally similar to slightly increased in size compared to prior study 07/19/2014, and again demonstrate only low-level hypermetabolism, suggesting persistence  of low-grade non-Hodgkin's lymphoma. 2. Spleen is normal in size and demonstrates no hypermetabolism. 3. Resolving seroma in the left axilla. Electronically Signed   By: Vinnie Langton M.D.   On: 04/09/2015 11:08    ASSESSMENT: 59 y.o. [place] woman s/p left axillary lymph node biopsy 06/29/2014 for a B-cell non-Hodgkin's lymphoma, consistent with marginal zone nodal lymphoma, advanced stage  (1) signed up for [facility] 08/29/2014  (2) consider rituximab if symptoms or cytopenias develop  (a)  serology at [facility] 08/29/2014 was negative for hepatitis C antibody, hepatitis B core antibody, hepatitis B surface antigen, hepatitis B surface antibody, or HIV antigen or antibody  (3) signed in for [facility] 08/30/2014  PLAN: Tinesha's marginal zone lymphoma is very stable. She is having no "B" symptoms, and no cytopenias.   we do note a slight increase in her absolute lymphocyte count and in her beta 2 microglobulin. We will continue to follow these.    today we discussed ibrutinib Marcello Moores which I think might be a good option for her if and when we initiate treatment. It would be easier to receive then rituximab and generally does a better job at clearing of the lymph nodes.   At present however we have no indication for treatment so we are continuing follow-up. She will have lab work every 3 months. I will see her again in 6 months. We will consider repeating a PET scan a year from now.   She has a good understanding of this plan. She agrees with it. She knows to call for any problems that may develop before  The next visit.  Chauncey Cruel, MD   04/11/2015 5:15 PM Medical Oncology and Hematology [facility] [address] [place], [place] [address] Tel. [phone]    Fax. [phone]

## 2015-04-11 NOTE — Telephone Encounter (Signed)
Appointments made and avs pritned for pateint °

## 2015-06-14 ENCOUNTER — Other Ambulatory Visit: Payer: Self-pay | Admitting: Obstetrics and Gynecology

## 2015-06-14 DIAGNOSIS — Z1231 Encounter for screening mammogram for malignant neoplasm of breast: Secondary | ICD-10-CM

## 2015-06-20 ENCOUNTER — Ambulatory Visit
Admission: RE | Admit: 2015-06-20 | Discharge: 2015-06-20 | Disposition: A | Discharge status: Home / Self Care | Payer: BLUE CROSS/BLUE SHIELD | Source: Ambulatory Visit | Attending: Obstetrics and Gynecology | Admitting: Obstetrics and Gynecology

## 2015-06-20 DIAGNOSIS — Z1231 Encounter for screening mammogram for malignant neoplasm of breast: Secondary | ICD-10-CM

## 2015-07-01 ENCOUNTER — Ambulatory Visit: Payer: BLUE CROSS/BLUE SHIELD

## 2015-07-18 ENCOUNTER — Other Ambulatory Visit (HOSPITAL_BASED_OUTPATIENT_CLINIC_OR_DEPARTMENT_OTHER): Payer: BLUE CROSS/BLUE SHIELD

## 2015-07-18 DIAGNOSIS — C8590 Non-Hodgkin lymphoma, unspecified, unspecified site: Secondary | ICD-10-CM

## 2015-07-18 DIAGNOSIS — C8584 Other specified types of non-Hodgkin lymphoma, lymph nodes of axilla and upper limb: Secondary | ICD-10-CM

## 2015-07-18 DIAGNOSIS — M8580 Other specified disorders of bone density and structure, unspecified site: Secondary | ICD-10-CM

## 2015-07-18 LAB — COMPREHENSIVE METABOLIC PANEL
ALBUMIN: 3.7 g/dL (ref 3.5–5.0)
ALT: 29 U/L (ref 0–55)
AST: 34 U/L (ref 5–34)
Alkaline Phosphatase: 121 U/L (ref 40–150)
Anion Gap: 7 mEq/L (ref 3–11)
BUN: 14.7 mg/dL (ref 7.0–26.0)
CO2: 28 mEq/L (ref 22–29)
Calcium: 9.5 mg/dL (ref 8.4–10.4)
Chloride: 107 mEq/L (ref 98–109)
Creatinine: 0.8 mg/dL (ref 0.6–1.1)
EGFR: 79 mL/min/{1.73_m2} — ABNORMAL LOW (ref >90)
Glucose: 90 mg/dl (ref 70–140)
Potassium: 4.2 mEq/L (ref 3.5–5.1)
SODIUM: 141 meq/L (ref 136–145)
TOTAL PROTEIN: 6.7 g/dL (ref 6.4–8.3)
Total Bilirubin: 0.36 mg/dL (ref 0.20–1.20)

## 2015-07-18 LAB — CBC WITH DIFFERENTIAL/PLATELET
BASO%: 1.1 % (ref 0.0–2.0)
Basophils Absolute: 0.1 10*3/uL (ref 0.0–0.1)
EOS%: 4.3 % (ref 0.0–7.0)
Eosinophils Absolute: 0.4 10*3/uL (ref 0.0–0.5)
HCT: 37.3 % (ref 34.8–46.6)
HGB: 12.3 g/dL (ref 11.6–15.9)
LYMPH%: 63.4 % — ABNORMAL HIGH (ref 14.0–49.7)
MCH: 30.5 pg (ref 25.1–34.0)
MCHC: 32.9 g/dL (ref 31.5–36.0)
MCV: 92.8 fL (ref 79.5–101.0)
MONO#: 0.3 10*3/uL (ref 0.1–0.9)
MONO%: 3.6 % (ref 0.0–14.0)
NEUT%: 27.6 % — ABNORMAL LOW (ref 38.4–76.8)
NEUTROS ABS: 2.5 10*3/uL (ref 1.5–6.5)
Platelets: 231 10*3/uL (ref 145–400)
RBC: 4.02 10*6/uL (ref 3.70–5.45)
RDW: 16 % — AB (ref 11.2–14.5)
WBC: 9 10*3/uL (ref 3.9–10.3)
lymph#: 5.7 10*3/uL — ABNORMAL HIGH (ref 0.9–3.3)

## 2015-07-18 LAB — LACTATE DEHYDROGENASE: LDH: 205 U/L (ref 125–245)

## 2015-07-18 LAB — TECHNOLOGIST REVIEW

## 2015-07-19 LAB — IGG, IGA, IGM
IGG (IMMUNOGLOBIN G), SERUM: 989 mg/dL (ref 700–1600)
IGM (IMMUNOGLOBIN M), SRM: 63 mg/dL (ref 26–217)
IgA, Qn, Serum: 215 mg/dL (ref 87–352)

## 2015-07-19 LAB — BETA 2 MICROGLOBULIN, SERUM: Beta-2: 2.5 mg/L — ABNORMAL HIGH (ref 0.6–2.4)

## 2015-07-22 ENCOUNTER — Encounter: Payer: Self-pay | Admitting: Oncology

## 2015-10-02 ENCOUNTER — Other Ambulatory Visit: Payer: Self-pay | Admitting: *Deleted

## 2015-10-02 DIAGNOSIS — C8580 Other specified types of non-Hodgkin lymphoma, unspecified site: Secondary | ICD-10-CM

## 2015-10-03 ENCOUNTER — Other Ambulatory Visit (HOSPITAL_BASED_OUTPATIENT_CLINIC_OR_DEPARTMENT_OTHER): Payer: BLUE CROSS/BLUE SHIELD

## 2015-10-03 DIAGNOSIS — C8590 Non-Hodgkin lymphoma, unspecified, unspecified site: Secondary | ICD-10-CM

## 2015-10-03 DIAGNOSIS — C8584 Other specified types of non-Hodgkin lymphoma, lymph nodes of axilla and upper limb: Secondary | ICD-10-CM | POA: Diagnosis not present

## 2015-10-03 DIAGNOSIS — C8580 Other specified types of non-Hodgkin lymphoma, unspecified site: Secondary | ICD-10-CM

## 2015-10-03 DIAGNOSIS — M8580 Other specified disorders of bone density and structure, unspecified site: Secondary | ICD-10-CM

## 2015-10-03 LAB — CBC WITH DIFFERENTIAL/PLATELET
BASO%: 0.4 % (ref 0.0–2.0)
BASOS ABS: 0 10*3/uL (ref 0.0–0.1)
EOS%: 4.5 % (ref 0.0–7.0)
Eosinophils Absolute: 0.4 10*3/uL (ref 0.0–0.5)
HCT: 36.7 % (ref 34.8–46.6)
HGB: 12.3 g/dL (ref 11.6–15.9)
LYMPH#: 6 10*3/uL — AB (ref 0.9–3.3)
LYMPH%: 63.5 % — AB (ref 14.0–49.7)
MCH: 31 pg (ref 25.1–34.0)
MCHC: 33.5 g/dL (ref 31.5–36.0)
MCV: 92.4 fL (ref 79.5–101.0)
MONO#: 0.4 10*3/uL (ref 0.1–0.9)
MONO%: 4.1 % (ref 0.0–14.0)
NEUT#: 2.6 10*3/uL (ref 1.5–6.5)
NEUT%: 27.5 % — AB (ref 38.4–76.8)
Platelets: 215 10*3/uL (ref 145–400)
RBC: 3.97 10*6/uL (ref 3.70–5.45)
RDW: 15.2 % — ABNORMAL HIGH (ref 11.2–14.5)
WBC: 9.4 10*3/uL (ref 3.9–10.3)

## 2015-10-03 LAB — COMPREHENSIVE METABOLIC PANEL
ALT: 33 U/L (ref 0–55)
AST: 38 U/L — ABNORMAL HIGH (ref 5–34)
Albumin: 3.7 g/dL (ref 3.5–5.0)
Alkaline Phosphatase: 110 U/L (ref 40–150)
Anion Gap: 6 mEq/L (ref 3–11)
BUN: 16 mg/dL (ref 7.0–26.0)
CALCIUM: 9 mg/dL (ref 8.4–10.4)
CHLORIDE: 106 meq/L (ref 98–109)
CO2: 29 meq/L (ref 22–29)
CREATININE: 0.8 mg/dL (ref 0.6–1.1)
EGFR: 76 mL/min/{1.73_m2} — AB (ref >90)
Glucose: 92 mg/dl (ref 70–140)
POTASSIUM: 4.3 meq/L (ref 3.5–5.1)
SODIUM: 142 meq/L (ref 136–145)
Total Bilirubin: 0.38 mg/dL (ref 0.20–1.20)
Total Protein: 6.5 g/dL (ref 6.4–8.3)

## 2015-10-03 LAB — LACTATE DEHYDROGENASE: LDH: 203 U/L (ref 125–245)

## 2015-10-04 LAB — IGG, IGA, IGM
IGG (IMMUNOGLOBIN G), SERUM: 917 mg/dL (ref 700–1600)
IGM (IMMUNOGLOBIN M), SRM: 57 mg/dL (ref 26–217)
IgA, Qn, Serum: 198 mg/dL (ref 87–352)

## 2015-10-10 ENCOUNTER — Ambulatory Visit (HOSPITAL_BASED_OUTPATIENT_CLINIC_OR_DEPARTMENT_OTHER): Payer: BLUE CROSS/BLUE SHIELD | Admitting: Oncology

## 2015-10-10 VITALS — BP 112/64 | HR 76 | Temp 97.9°F | Resp 18 | Ht 62.0 in | Wt 135.5 lb

## 2015-10-10 DIAGNOSIS — R599 Enlarged lymph nodes, unspecified: Secondary | ICD-10-CM

## 2015-10-10 DIAGNOSIS — C8584 Other specified types of non-Hodgkin lymphoma, lymph nodes of axilla and upper limb: Secondary | ICD-10-CM | POA: Diagnosis not present

## 2015-10-10 DIAGNOSIS — C8580 Other specified types of non-Hodgkin lymphoma, unspecified site: Secondary | ICD-10-CM

## 2015-10-10 NOTE — Progress Notes (Signed)
Dana Butler  Telephone:(336) [phone] Fax:(336) [phone]     ID: Dana Butler DOB: [birth year]  MR#: [id number]  PJA#:[id number]  Patient Care Team: Dana Caraway, MD as PCP - General (Family Medicine) Dana Marble, MD as Consulting Physician (Otolaryngology) Dana Gemma, MD as Consulting Physician ([facility]) Dana [facility], RD as Dietitian (Family Medicine) PCP: Dana Caraway, MD OTHER MD: Dana Dunker MD  CHIEF COMPLAINT: nodal marginal zone non-Hodgkin's lymphoma  CURRENT TREATMENT: Observation   HISTORY OF PRESENT ILNESS: From the 07/20/2014 intake note:  Dana Butler had some "shooting pains across her neck" earliy in 2016, and saw ENT regarding this. There was a question of mild cervical adenopathy noted at that time. More recently, she underwent screening mammography 05/30/2014 at the [facility]. This suggested enlarged lymph nodes in the left axilla and the patient was recalled for left axillary ultrasound 06/08/2014. There were 2 enlarged lymph nodes, one measuring 2.4 cm, retaining its fatty hilum, but with the cortex measuring 4.5 mm. Posterior to that there was a second lymph node measuring 3.1 cm, with no internal fat. There was no mass noted in the upper outer quadrant of the left breast and review of the earlier mammogram showed no mammographic abnormality in either breast.  Biopsy of one of the left axillary lymph nodes 06/19/2014 showed ([id number]) a relatively monomorphic population of small round to slightly irregular lymphocytes, with dense chromatin and inconspicuous nucleoli. Flow cytometry ([id number]) showed a lambda restricted B-cell population expressing CD20, but not CD5, CD10 or cycling D1. Ki-67 was less than 10%. A preliminary diagnosis of B-cell non-Hodgkin's lymphoma, low-grade, was made..  On 06/29/2014 the patient underwent left axillary excisional biopsy with a total of 4 lymph nodes removed. The pathology from this more extensive  sample showed ([id number]) effacement of the lymph node architecture by the lymphoma cells previously described, with only scattered larger lymphoid cells and histiocytes and no significant plasma cell component. There were scattered mitotic figures and no necrosis. Repeat flow cytometry showed most of the lymphocytes were CD20, CD23 and CD79a positive, with coexpression of CD43 but not CD5, CD10, BCL-6 or cyclin D1. The cells were BCL-2 positive. Overall it was pathology's impression that this was a low-grade B-cell non-Hodgkin's lymphoma, marginal zone type.  On 07/19/2014 Dana Butler underwent PET scanning which showed bilateral cervical adenopathy, bilateral axillary adenopathy, some small left common iliac lymph nodes and a normal spleen. There was very low metabolic activity in all the lymph nodes noted and similarly in the pelvic marrow suggesting possible marrow involvement.  The patient's subsequent history is as detailed below  INTERVAL HISTORY: Dana Butler returns today for follow-up of her nodal marginal zone, B-cell non-Hodgkin's lymphoma. She is doing "great", she and Dana Butler travel a lot, both two-family location on [place] for enjoyment, she exercises by walking everyday at least 40 minutes and doing yoga at least twice a week, and she has an excellent diet.  She has had no fever, rash, bleeding, unexplained weight loss, or unexplained fatigue. She does have palpable adenopathy in her pain it has not changed significantly. As per her worst problem is she says she's gained a little bit of weight.  REVIEW OF SYSTEMS:  A detailed review of systems today was was otherwise entirely negative  PAST MEDICAL HISTORY: Past Medical History  Diagnosis Date  . Medical history non-contributory     PAST SURGICAL HISTORY: Past Surgical History  Procedure Laterality Date  . Diagnostic laparoscopy  2005    BSO  .  Abdominal hysterectomy    . Colonoscopy    . Axillary lymph node biopsy Left 06/29/2014    Procedure:  AXILLARY LYMPH NODE BIOPSY;  Surgeon: Dana Level, MD;  Location: [facility];  Service: General;  Laterality: Left;    FAMILY HISTORY No family history on file. The patient's father died at the age of 16, the patient's mother at age 60. The patient has 2 brothers, no sisters. Dana Butler's maternal grandmother had a diagnosis of leukemia, type unknown, in her 74s. A maternal cousin has a history of lymphoma.  GYNECOLOGIC HISTORY:  No LMP recorded. Patient has had a hysterectomy. Menarche age 29, first live birth age 2, the patient is GX P3. She underwent a hysterectomy with bilateral salpingo-oophorectomy at age 25. She continues on hormone replacement.  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 Her husband "Dana Butler" just retired from anesthesia July 2015. (Incidentally he was the anesthesiologist during my prostatectomy 2004). Their children are currently age 38, 72, and 15. There are no grandchildren. The patient attends first [employer] [facility]     ADVANCED DIRECTIVES: In place   HEALTH MAINTENANCE: Social History  Substance Use Topics  . Smoking status: Never Smoker   . Smokeless tobacco: Not on file  . Alcohol Use: No     Colonoscopy:  PAP:  Bone density:  Lipid panel:  Allergies  Allergen Reactions  . Latex Rash  . Tape Rash    Current Outpatient Prescriptions  Medication Sig Dispense Refill  . ALPRAZolam (XANAX) 0.5 MG tablet     . calcium carbonate (OS-CAL) 1250 (500 CA) MG chewable tablet Chew by mouth.    . Cholecalciferol (VITAMIN D3) 5000 UNITS TABS Take by mouth.    . estradiol (VIVELLE-DOT) 0.05 MG/24HR patch Place 1 patch onto the skin 2 (two) times a week.    . Fish Oil OIL Take by mouth.    . Glucosamine-Chondroitin (GLUCOSAMINE CHONDR COMPLEX PO) Take by mouth.    [name] ibuprofen (ADVIL,MOTRIN) 200 MG tablet Take 200 mg by mouth.    . Multiple Vitamins-Minerals (MULTIVITAMIN WITH MINERALS) tablet Take 1 tablet by  mouth daily.    . Zaleplon (SONATA PO) Take by mouth.     No current facility-administered medications for this visit.    OBJECTIVE: Middle-aged white woman Who appears well  Filed Vitals:   10/10/15 1418  BP: 112/64  Pulse: 76  Temp: 97.9 F (36.6 C)  Resp: 18     Body mass index is 24.78 kg/(m^2).    ECOG FS:0 - Asymptomatic  Sclerae unicteric, pupils round and equal Oropharynx clear and moist-- no thrush or other lesions Left lower cervical adenopathy, the largest lymph node measuring approximately two thirds of a centimeter; bilateral supraclavicular and bilateral axillary adenopathy, the largest axillary lymph node measuring approximately 1-1/2 cm. There is no inguinal adenopathy. Lungs no rales or rhonchi Heart regular rate and rhythm Abd soft, nontender, positive bowel sounds, no palpable splenomegaly MSK no focal spinal tenderness, no upper extremity lymphedema Neuro: nonfocal, well oriented, appropriate affect Breasts: Deferred   LAB RESULTS: Results for TASNEEM, CORMIER (MRN [id number]) as of 10/10/2015 14:52  Ref. Range 07/17/2014 08:53 09/27/2014 15:16 04/04/2015 13:23 07/18/2015 14:13 10/03/2015 14:27  LDH Latest Ref Range: 125-245 U/L 169 168 197 205 203            IgG, Qn, Serum 700 - 1600 mg/dL 831 517 6160V    IgA, Qn, Serum 87 - 352 mg/dL  198 215     IgM, Qn, Serum 26 - 217 mg/dL 57 63    Resulting                CMP     Component Value Date/Time   NA 142 10/03/2015 1427   K 4.3 10/03/2015 1427   CO2 29 10/03/2015 1427   GLUCOSE 92 10/03/2015 1427   BUN 16.0 10/03/2015 1427   CREATININE 0.8 10/03/2015 1427   CALCIUM 9.0 10/03/2015 1427   PROT 6.5 10/03/2015 1427   ALBUMIN 3.7 10/03/2015 1427   AST 38* 10/03/2015 1427   ALT 33 10/03/2015 1427   ALKPHOS 110 10/03/2015 1427   BILITOT 0.38 10/03/2015 1427    INo results found for: SPEP, UPEP  Lab Results  Component Value Date   WBC 9.4 10/03/2015   NEUTROABS 2.6 10/03/2015   HGB 12.3  10/03/2015   HCT 36.7 10/03/2015   MCV 92.4 10/03/2015   PLT 215 10/03/2015      Chemistry      Component Value Date/Time   NA 142 10/03/2015 1427   K 4.3 10/03/2015 1427   CO2 29 10/03/2015 1427   BUN 16.0 10/03/2015 1427   CREATININE 0.8 10/03/2015 1427      Component Value Date/Time   CALCIUM 9.0 10/03/2015 1427   ALKPHOS 110 10/03/2015 1427   AST 38* 10/03/2015 1427   ALT 33 10/03/2015 1427   BILITOT 0.38 10/03/2015 1427       No results found for: LABCA2  No components found for: LABCA125  No results for input(s): INR in the last 168 hours.  Urinalysis No results found for: COLORURINE, APPEARANCEUR, LABSPEC, PHURINE, GLUCOSEU, HGBUR, BILIRUBINUR, KETONESUR, PROTEINUR, UROBILINOGEN, NITRITE, LEUKOCYTESUR  STUDIES: CLINICAL DATA: Screening.  EXAM: DIGITAL SCREENING BILATERAL MAMMOGRAM WITH 3D TOMO WITH CAD  COMPARISON: Previous exam(s).  ACR Breast Density Category b: There are scattered areas of fibroglandular density.  FINDINGS: There are no findings suspicious for malignancy. Images were processed with CAD.  IMPRESSION: No mammographic evidence of malignancy. A result letter of this screening mammogram will be mailed directly to the patient.  RECOMMENDATION: Screening mammogram in one year. (Code:SM-B-01Y)  BI-RADS CATEGORY 1: Negative.   Electronically Signed  By: Abelardo Diesel M.D.  On: 06/21/2015 09:43  ASSESSMENT: 60 y.o. [place] woman s/p left axillary lymph node biopsy 06/29/2014 for a B-cell non-Hodgkin's lymphoma, consistent with marginal zone nodal lymphoma, advanced stage  (1) signed up for [facility] 08/29/2014  (2) consider rituximab or ibrutinib if symptoms or cytopenias develop  (a)  serology at [facility] 08/29/2014 was negative for hepatitis C antibody, hepatitis B core antibody, hepatitis B surface antigen, hepatitis B surface antibody, or HIV antigen or antibody   PLAN: Sanda Is now a little over one year out from  definitive diagnosis of her marginal zone B cell non-Hodgkin's lymphoma, with no indication for treatment. This is very favorable.  She has measurable adenopathy in both axillae, both supraclavicular areas, and the left neck. These can be followed clinically. We will obtain a PET scan in December to make sure there are no other occult areas of disease.  Not sure of the cause of her minimal AST elevation. She does not drink alcohol at all. This could be related to medication or fatty liver. It requires only follow-up.  At this point I would be comfortable checking labs on a six-month instead of an every three-month basis. She has a very good understanding of "B" symptoms and if any do develop she  will let us know.  I'll see her again in January 2018. She knows to call for any problems that may develop before that visit.  Chauncey Cruel, MD   10/10/2015 2:27 PM Medical Oncology and Hematology [facility] [address] [place], [place] [address] Tel. [phone]    Fax. [phone]

## 2016-02-10 ENCOUNTER — Other Ambulatory Visit: Payer: Self-pay | Admitting: *Deleted

## 2016-03-06 ENCOUNTER — Other Ambulatory Visit: Payer: Self-pay | Admitting: *Deleted

## 2016-03-06 DIAGNOSIS — C8580 Other specified types of non-Hodgkin lymphoma, unspecified site: Secondary | ICD-10-CM

## 2016-03-30 ENCOUNTER — Telehealth: Payer: Self-pay | Admitting: *Deleted

## 2016-03-30 ENCOUNTER — Other Ambulatory Visit: Payer: Self-pay | Admitting: Oncology

## 2016-03-30 ENCOUNTER — Telehealth: Payer: Self-pay | Admitting: Emergency Medicine

## 2016-03-30 DIAGNOSIS — C8580 Other specified types of non-Hodgkin lymphoma, unspecified site: Secondary | ICD-10-CM

## 2016-03-30 NOTE — Telephone Encounter (Signed)
Patient called this office in regards to wanting to postpone the PET scan until late 2018 due to insurance reasons. States she has not met her deductible this year and would have to pay 6k out of pocket for PET scan. Patient calling to confirm with Dr Jana Hakim that this is ok with this plan. Per patient; no current problems and is feeling well. Per Dr Jana Hakim he is ok with this. Called scheduling to have appointment canceled. Patient aware.

## 2016-03-30 NOTE — Telephone Encounter (Signed)
"  I have a PET scan scheduled for tomorrow.  Do I need this done?  I have not met my my insurance deductible and will have to pay $6000.  Am I to have a  PET every two years or annually?  Please call me at [phone]."

## 2016-03-31 ENCOUNTER — Ambulatory Visit (HOSPITAL_COMMUNITY): Payer: BLUE CROSS/BLUE SHIELD

## 2016-04-01 ENCOUNTER — Other Ambulatory Visit: Payer: Self-pay | Admitting: Oncology

## 2016-04-23 ENCOUNTER — Other Ambulatory Visit (HOSPITAL_BASED_OUTPATIENT_CLINIC_OR_DEPARTMENT_OTHER): Payer: BLUE CROSS/BLUE SHIELD

## 2016-04-23 DIAGNOSIS — C8584 Other specified types of non-Hodgkin lymphoma, lymph nodes of axilla and upper limb: Secondary | ICD-10-CM | POA: Diagnosis not present

## 2016-04-23 DIAGNOSIS — C8580 Other specified types of non-Hodgkin lymphoma, unspecified site: Secondary | ICD-10-CM

## 2016-04-23 LAB — CBC WITH DIFFERENTIAL/PLATELET
BASO%: 0.3 % (ref 0.0–2.0)
BASOS ABS: 0.1 10*3/uL (ref 0.0–0.1)
EOS ABS: 0.6 10*3/uL — AB (ref 0.0–0.5)
EOS%: 3.3 % (ref 0.0–7.0)
HCT: 36 % (ref 34.8–46.6)
HEMOGLOBIN: 12.1 g/dL (ref 11.6–15.9)
LYMPH%: 80.2 % — AB (ref 14.0–49.7)
MCH: 30.7 pg (ref 25.1–34.0)
MCHC: 33.6 g/dL (ref 31.5–36.0)
MCV: 91.4 fL (ref 79.5–101.0)
MONO#: 0.4 10*3/uL (ref 0.1–0.9)
MONO%: 1.9 % (ref 0.0–14.0)
NEUT#: 2.8 10*3/uL (ref 1.5–6.5)
NEUT%: 14.3 % — AB (ref 38.4–76.8)
Platelets: 232 10*3/uL (ref 145–400)
RBC: 3.94 10*6/uL (ref 3.70–5.45)
RDW: 16 % — ABNORMAL HIGH (ref 11.2–14.5)
WBC: 19.2 10*3/uL — ABNORMAL HIGH (ref 3.9–10.3)
lymph#: 15.4 10*3/uL — ABNORMAL HIGH (ref 0.9–3.3)

## 2016-04-23 LAB — COMPREHENSIVE METABOLIC PANEL
ALBUMIN: 3.6 g/dL (ref 3.5–5.0)
ALK PHOS: 145 U/L (ref 40–150)
ALT: 25 U/L (ref 0–55)
AST: 33 U/L (ref 5–34)
Anion Gap: 9 mEq/L (ref 3–11)
BUN: 17.3 mg/dL (ref 7.0–26.0)
CO2: 26 mEq/L (ref 22–29)
Calcium: 9.5 mg/dL (ref 8.4–10.4)
Chloride: 108 mEq/L (ref 98–109)
Creatinine: 0.8 mg/dL (ref 0.6–1.1)
EGFR: 83 mL/min/{1.73_m2} — AB (ref >90)
Glucose: 102 mg/dl (ref 70–140)
POTASSIUM: 4 meq/L (ref 3.5–5.1)
SODIUM: 142 meq/L (ref 136–145)
Total Bilirubin: 0.42 mg/dL (ref 0.20–1.20)
Total Protein: 6.7 g/dL (ref 6.4–8.3)

## 2016-04-23 LAB — LACTATE DEHYDROGENASE: LDH: 204 U/L (ref 125–245)

## 2016-04-24 LAB — BETA 2 MICROGLOBULIN, SERUM: Beta-2: 3 mg/L — ABNORMAL HIGH (ref 0.6–2.4)

## 2016-04-24 LAB — IGG, IGA, IGM
IgA, Qn, Serum: 211 mg/dL (ref 87–352)
IgG, Qn, Serum: 961 mg/dL (ref 700–1600)
IgM, Qn, Serum: 52 mg/dL (ref 26–217)

## 2016-05-11 ENCOUNTER — Ambulatory Visit (HOSPITAL_BASED_OUTPATIENT_CLINIC_OR_DEPARTMENT_OTHER): Payer: BLUE CROSS/BLUE SHIELD | Admitting: Oncology

## 2016-05-11 VITALS — BP 121/54 | HR 64 | Temp 97.6°F | Resp 20 | Ht 62.0 in | Wt 131.3 lb

## 2016-05-11 DIAGNOSIS — C8584 Other specified types of non-Hodgkin lymphoma, lymph nodes of axilla and upper limb: Secondary | ICD-10-CM | POA: Diagnosis not present

## 2016-05-11 DIAGNOSIS — Z23 Encounter for immunization: Secondary | ICD-10-CM | POA: Diagnosis not present

## 2016-05-11 DIAGNOSIS — C8580 Other specified types of non-Hodgkin lymphoma, unspecified site: Secondary | ICD-10-CM

## 2016-05-11 DIAGNOSIS — R599 Enlarged lymph nodes, unspecified: Secondary | ICD-10-CM | POA: Diagnosis not present

## 2016-05-11 MED ORDER — INFLUENZA VAC SPLIT QUAD 0.5 ML IM SUSY
0.5000 mL | PREFILLED_SYRINGE | Freq: Once | INTRAMUSCULAR | Status: AC
  Administered 2016-05-11: 0.5 mL via INTRAMUSCULAR
  Filled 2016-05-11: qty 0.5

## 2016-05-11 NOTE — Progress Notes (Signed)
[place]  Telephone:(336) [phone] Fax:(336) [phone]     ID: Breniyah Romm Lasalle DOB: 1955-08-17  MR#: [id number]  PYK#:[id number]  Patient Care Team: Cari Caraway, MD as PCP - General (Family Medicine) Jodi Marble, MD as Consulting Physician (Otolaryngology) Armandina Gemma, MD as Consulting Physician ([facility]) Kennith Center, RD as Dietitian (Family Medicine) PCP: Cari Caraway, MD OTHER MD: Barrie Dunker MD  CHIEF COMPLAINT: nodal marginal zone non-Hodgkin's lymphoma  CURRENT TREATMENT: Observation   HISTORY OF PRESENT ILNESS: From the 07/20/2014 intake note:  Larisha had some "shooting pains across her neck" earliy in 2016, and saw ENT regarding this. There was a question of mild cervical adenopathy noted at that time. More recently, she underwent screening mammography 05/30/2014 at the [facility]. This suggested enlarged lymph nodes in the left axilla and the patient was recalled for left axillary ultrasound 06/08/2014. There were 2 enlarged lymph nodes, one measuring 2.4 cm, retaining its fatty hilum, but with the cortex measuring 4.5 mm. Posterior to that there was a second lymph node measuring 3.1 cm, with no internal fat. There was no mass noted in the upper outer quadrant of the left breast and review of the earlier mammogram showed no mammographic abnormality in either breast.  Biopsy of one of the left axillary lymph nodes 06/19/2014 showed ([id number]) a relatively monomorphic population of small round to slightly irregular lymphocytes, with dense chromatin and inconspicuous nucleoli. Flow cytometry ([id number] [id number]) showed a lambda restricted B-cell population expressing CD20, but not CD5, CD10 or cycling D1. Ki-67 was less than 10%. A preliminary diagnosis of B-cell non-Hodgkin's lymphoma, low-grade, was made..  On 06/29/2014 the patient underwent left axillary excisional biopsy with a total of 4 lymph nodes removed. The pathology from this more extensive  sample showed ([id number]) effacement of the lymph node architecture by the lymphoma cells previously described, with only scattered larger lymphoid cells and histiocytes and no significant plasma cell component. There were scattered mitotic figures and no necrosis. Repeat flow cytometry showed most of the lymphocytes were CD20, CD23 and CD79a positive, with coexpression of CD43 but not CD5, CD10, BCL-6 or cyclin D1. The cells were BCL-2 positive. Overall it was pathology's impression that this was a low-grade B-cell non-Hodgkin's lymphoma, marginal zone type.  On 07/19/2014 Mayuri underwent PET scanning which showed bilateral cervical adenopathy, bilateral axillary adenopathy, some small left common iliac lymph nodes and a normal spleen. There was very low metabolic activity in all the lymph nodes noted and similarly in the pelvic marrow suggesting possible marrow involvement.  The patient's subsequent history is as detailed below  INTERVAL HISTORY: Tikia returns today for follow-up of her nodal marginal zone, B-cell non-Hodgkin's lymphoma. She is doing "great", she and Zan travel a lot, both two-family location on [place] for enjoyment, she exercises by walking everyday at least 40 minutes and doing yoga at least twice a week, and she has an excellent diet.  She has had no fever, rash, bleeding, unexplained weight loss, or unexplained fatigue. She does have palpable adenopathy in her pain it has not changed significantly. As per her worst problem is she says she's gained a little bit of weight.  REVIEW OF SYSTEMS:  A detailed review of systems today was was otherwise entirely negative  PAST MEDICAL HISTORY: Past Medical History:  Diagnosis Date  . Medical history non-contributory     PAST SURGICAL HISTORY: Past Surgical History:  Procedure Laterality Date  . ABDOMINAL HYSTERECTOMY    . AXILLARY LYMPH NODE  BIOPSY Left 06/29/2014   Procedure: AXILLARY LYMPH NODE BIOPSY;  Surgeon: Armandina Gemma, MD;   Location: [facility];  Service: General;  Laterality: Left;  . COLONOSCOPY    . DIAGNOSTIC LAPAROSCOPY  2005   BSO    FAMILY HISTORY No family history on file. The patient's father died at the age of 62, the patient's mother at age 85. The patient has 2 brothers, no sisters. Rasa's maternal grandmother had a diagnosis of leukemia, type unknown, in her 40s. A maternal cousin has a history of lymphoma.  GYNECOLOGIC HISTORY:  No LMP recorded. Patient has had a hysterectomy. Menarche age 89, first live birth age 68, the patient is GX P3. She underwent a hysterectomy with bilateral salpingo-oophorectomy at age 1. She continues on hormone replacement.  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 Her husband "Zan" just retired from anesthesia July 2015. (Incidentally he was the anesthesiologist during my prostatectomy 2004). Their children are currently age 83, 81, and 28. There are no grandchildren. The patient attends first [place]: In place   HEALTH MAINTENANCE: Social History  Substance Use Topics  . Smoking status: Never Smoker  . Smokeless tobacco: Not on file  . Alcohol use No     Colonoscopy:  PAP:  Bone density:  Lipid panel:  Allergies  Allergen Reactions  . Latex Rash  . Tape Rash    Current Outpatient Prescriptions  Medication Sig Dispense Refill  . ALPRAZolam (XANAX) 0.5 MG tablet     . calcium carbonate (OS-CAL) 1250 (500 CA) MG chewable tablet Chew by mouth.    . Cholecalciferol (VITAMIN D3) 5000 UNITS TABS Take by mouth.    . estradiol (VIVELLE-DOT) 0.05 MG/24HR patch Place 1 patch onto the skin 2 (two) times a week.    . Fish Oil OIL Take by mouth.    . Glucosamine-Chondroitin (GLUCOSAMINE CHONDR COMPLEX PO) Take by mouth.    [name] ibuprofen (ADVIL,MOTRIN) 200 MG tablet Take 200 mg by mouth.    . Multiple Vitamins-Minerals (MULTIVITAMIN WITH MINERALS) tablet Take 1 tablet by  mouth daily.    . Zaleplon (SONATA PO) Take by mouth.     No current facility-administered medications for this visit.     OBJECTIVE: Middle-aged white woman Who appears well  Vitals:   05/11/16 1426  BP: (!) 121/54  Pulse: 64  Resp: 20  Temp: 97.6 F (36.4 C)     Body mass index is 24.02 kg/m.    ECOG FS:0 - Asymptomatic  Sclerae unicteric, pupils round and equal Oropharynx clear and moist-- no thrush or other lesions Left lower cervical adenopathy, the largest lymph node measuring approximately two thirds of a centimeter; bilateral supraclavicular and bilateral axillary adenopathy, the largest axillary lymph node measuring approximately 1-1/2 cm. There is no inguinal adenopathy. Lungs no rales or rhonchi Heart regular rate and rhythm Abd soft, nontender, positive bowel sounds, no palpable splenomegaly MSK no focal spinal tenderness, no upper extremity lymphedema Neuro: nonfocal, well oriented, appropriate affect Breasts: Deferred   LAB RESULTS: Results for LORIS, SEELYE (MRN [id number]) as of 10/10/2015 14:52  Ref. Range 07/17/2014 08:53 09/27/2014 15:16 04/04/2015 13:23 07/18/2015 14:13 10/03/2015 14:27  LDH Latest Ref Range: 125-245 U/L 169 168 197 205 203            IgG, Qn, Serum 700 - 1600 mg/dL 917 989 1060R    IgA, Qn, Serum 87 - 352 mg/dL 198  215     IgM, Qn, Serum 26 - 217 mg/dL 57 63    Resulting                CMP     Component Value Date/Time   NA 142 04/23/2016 1235   K 4.0 04/23/2016 1235   CO2 26 04/23/2016 1235   GLUCOSE 102 04/23/2016 1235   BUN 17.3 04/23/2016 1235   CREATININE 0.8 04/23/2016 1235   CALCIUM 9.5 04/23/2016 1235   PROT 6.7 04/23/2016 1235   ALBUMIN 3.6 04/23/2016 1235   AST 33 04/23/2016 1235   ALT 25 04/23/2016 1235   ALKPHOS 145 04/23/2016 1235   BILITOT 0.42 04/23/2016 1235    INo results found for: SPEP, UPEP  Lab Results  Component Value Date   WBC 19.2 (H) 04/23/2016   NEUTROABS 2.8 04/23/2016   HGB 12.1  04/23/2016   HCT 36.0 04/23/2016   MCV 91.4 04/23/2016   PLT 232 04/23/2016      Chemistry      Component Value Date/Time   NA 142 04/23/2016 1235   K 4.0 04/23/2016 1235   CO2 26 04/23/2016 1235   BUN 17.3 04/23/2016 1235   CREATININE 0.8 04/23/2016 1235      Component Value Date/Time   CALCIUM 9.5 04/23/2016 1235   ALKPHOS 145 04/23/2016 1235   AST 33 04/23/2016 1235   ALT 25 04/23/2016 1235   BILITOT 0.42 04/23/2016 1235       No results found for: LABCA2  No components found for: LABCA125  No results for input(s): INR in the last 168 hours.  Urinalysis No results found for: COLORURINE, APPEARANCEUR, LABSPEC, PHURINE, GLUCOSEU, HGBUR, BILIRUBINUR, KETONESUR, PROTEINUR, UROBILINOGEN, NITRITE, LEUKOCYTESUR  STUDIES: CLINICAL DATA: Screening.  EXAM: DIGITAL SCREENING BILATERAL MAMMOGRAM WITH 3D TOMO WITH CAD  COMPARISON: Previous exam(s).  ACR Breast Density Category b: There are scattered areas of fibroglandular density.  FINDINGS: There are no findings suspicious for malignancy. Images were processed with CAD.  IMPRESSION: No mammographic evidence of malignancy. A result letter of this screening mammogram will be mailed directly to the patient.  RECOMMENDATION: Screening mammogram in one year. (Code:SM-B-01Y)  BI-RADS CATEGORY 1: Negative.   Electronically Signed  By: Abelardo Diesel M.D.  On: 06/21/2015 09:43  ASSESSMENT: 61 y.o. [place] woman s/p left axillary lymph node biopsy 06/29/2014 for a B-cell non-Hodgkin's lymphoma, consistent with marginal zone nodal lymphoma, advanced stage  (1) signed up for [facility] 08/29/2014  (2) consider rituximab or ibrutinib if symptoms or cytopenias develop  (a)  serology at [facility] 08/29/2014 was negative for hepatitis C antibody, hepatitis B core antibody, hepatitis B surface antigen, hepatitis B surface antibody, or HIV antigen or antibody   PLAN: Floetta Is now a little over one year out from  definitive diagnosis of her marginal zone B cell non-Hodgkin's lymphoma, with no indication for treatment. This is very favorable.  She has measurable adenopathy in both axillae, both supraclavicular areas, and the left neck. These can be followed clinically. We will obtain a PET scan in December to make sure there are no other occult areas of disease.  Not sure of the cause of her minimal AST elevation. She does not drink alcohol at all. This could be related to medication or fatty liver. It requires only follow-up.  At this point I would be comfortable checking labs on a six-month instead of an every three-month basis. She has a very good understanding of "B" symptoms and if any do develop she  will let us know.  I'll see her again in January 2018. She knows to call for any problems that may develop before that visit.  Chauncey Cruel, MD   05/11/2016 2:46 PM Medical Oncology and Hematology [facility] [address] [place], [place] [address] Tel. [phone]    Fax. [phone]

## 2016-05-14 ENCOUNTER — Other Ambulatory Visit: Payer: Self-pay | Admitting: Oncology

## 2016-05-14 DIAGNOSIS — Z1231 Encounter for screening mammogram for malignant neoplasm of breast: Secondary | ICD-10-CM

## 2016-06-22 ENCOUNTER — Ambulatory Visit
Admission: RE | Admit: 2016-06-22 | Discharge: 2016-06-22 | Disposition: A | Discharge status: Home / Self Care | Payer: BLUE CROSS/BLUE SHIELD | Source: Ambulatory Visit | Attending: Oncology | Admitting: Oncology

## 2016-06-22 DIAGNOSIS — Z1231 Encounter for screening mammogram for malignant neoplasm of breast: Secondary | ICD-10-CM

## 2016-08-11 ENCOUNTER — Other Ambulatory Visit (HOSPITAL_BASED_OUTPATIENT_CLINIC_OR_DEPARTMENT_OTHER): Payer: BLUE CROSS/BLUE SHIELD

## 2016-08-11 DIAGNOSIS — C8584 Other specified types of non-Hodgkin lymphoma, lymph nodes of axilla and upper limb: Secondary | ICD-10-CM | POA: Diagnosis not present

## 2016-08-11 DIAGNOSIS — C8580 Other specified types of non-Hodgkin lymphoma, unspecified site: Secondary | ICD-10-CM

## 2016-08-11 LAB — CBC WITH DIFFERENTIAL/PLATELET
BASO%: 0.3 % (ref 0.0–2.0)
Basophils Absolute: 0.1 10*3/uL (ref 0.0–0.1)
EOS%: 1.6 % (ref 0.0–7.0)
Eosinophils Absolute: 0.3 10*3/uL (ref 0.0–0.5)
HEMATOCRIT: 35.4 % (ref 34.8–46.6)
HEMOGLOBIN: 11.7 g/dL (ref 11.6–15.9)
LYMPH%: 77.9 % — ABNORMAL HIGH (ref 14.0–49.7)
MCH: 30.7 pg (ref 25.1–34.0)
MCHC: 33.1 g/dL (ref 31.5–36.0)
MCV: 92.5 fL (ref 79.5–101.0)
MONO#: 0.4 10*3/uL (ref 0.1–0.9)
MONO%: 2 % (ref 0.0–14.0)
NEUT%: 18.2 % — AB (ref 38.4–76.8)
NEUTROS ABS: 3.8 10*3/uL (ref 1.5–6.5)
PLATELETS: 228 10*3/uL (ref 145–400)
RBC: 3.83 10*6/uL (ref 3.70–5.45)
RDW: 15.9 % — AB (ref 11.2–14.5)
WBC: 21 10*3/uL — AB (ref 3.9–10.3)
lymph#: 16.4 10*3/uL — ABNORMAL HIGH (ref 0.9–3.3)

## 2016-08-11 LAB — COMPREHENSIVE METABOLIC PANEL
ALBUMIN: 3.7 g/dL (ref 3.5–5.0)
ALK PHOS: 108 U/L (ref 40–150)
ALT: 26 U/L (ref 0–55)
AST: 34 U/L (ref 5–34)
Anion Gap: 8 mEq/L (ref 3–11)
BUN: 12.8 mg/dL (ref 7.0–26.0)
CO2: 25 mEq/L (ref 22–29)
Calcium: 9.4 mg/dL (ref 8.4–10.4)
Chloride: 108 mEq/L (ref 98–109)
Creatinine: 0.7 mg/dL (ref 0.6–1.1)
GLUCOSE: 85 mg/dL (ref 70–140)
POTASSIUM: 4 meq/L (ref 3.5–5.1)
Sodium: 141 mEq/L (ref 136–145)
Total Bilirubin: 0.55 mg/dL (ref 0.20–1.20)
Total Protein: 6.3 g/dL — ABNORMAL LOW (ref 6.4–8.3)

## 2016-08-11 LAB — TECHNOLOGIST REVIEW

## 2016-08-11 LAB — LACTATE DEHYDROGENASE: LDH: 219 U/L (ref 125–245)

## 2016-08-12 LAB — IGG, IGA, IGM
IGA/IMMUNOGLOBULIN A, SERUM: 190 mg/dL (ref 87–352)
IGG (IMMUNOGLOBIN G), SERUM: 919 mg/dL (ref 700–1600)
IgM, Qn, Serum: 51 mg/dL (ref 26–217)

## 2016-08-12 LAB — BETA 2 MICROGLOBULIN, SERUM: BETA 2: 3.1 mg/L — AB (ref 0.6–2.4)

## 2016-08-13 ENCOUNTER — Encounter: Payer: Self-pay | Admitting: Oncology

## 2016-10-26 ENCOUNTER — Other Ambulatory Visit (HOSPITAL_BASED_OUTPATIENT_CLINIC_OR_DEPARTMENT_OTHER): Payer: BLUE CROSS/BLUE SHIELD

## 2016-10-26 DIAGNOSIS — C8580 Other specified types of non-Hodgkin lymphoma, unspecified site: Secondary | ICD-10-CM | POA: Diagnosis not present

## 2016-10-26 LAB — COMPREHENSIVE METABOLIC PANEL
ALK PHOS: 152 U/L — AB (ref 40–150)
ALT: 26 U/L (ref 0–55)
AST: 32 U/L (ref 5–34)
Albumin: 3.6 g/dL (ref 3.5–5.0)
Anion Gap: 6 mEq/L (ref 3–11)
BILIRUBIN TOTAL: 0.36 mg/dL (ref 0.20–1.20)
BUN: 12.2 mg/dL (ref 7.0–26.0)
CO2: 28 mEq/L (ref 22–29)
CREATININE: 0.8 mg/dL (ref 0.6–1.1)
Calcium: 9.4 mg/dL (ref 8.4–10.4)
Chloride: 109 mEq/L (ref 98–109)
EGFR: 86 mL/min/{1.73_m2} — ABNORMAL LOW (ref >90)
Glucose: 98 mg/dl (ref 70–140)
Potassium: 4.4 mEq/L (ref 3.5–5.1)
Sodium: 143 mEq/L (ref 136–145)
TOTAL PROTEIN: 6.4 g/dL (ref 6.4–8.3)

## 2016-10-26 LAB — CBC WITH DIFFERENTIAL/PLATELET
BASO%: 0.3 % (ref 0.0–2.0)
Basophils Absolute: 0.1 10*3/uL (ref 0.0–0.1)
EOS ABS: 0.4 10*3/uL (ref 0.0–0.5)
EOS%: 1.8 % (ref 0.0–7.0)
HCT: 36.5 % (ref 34.8–46.6)
HGB: 12.1 g/dL (ref 11.6–15.9)
LYMPH%: 81.4 % — AB (ref 14.0–49.7)
MCH: 30.7 pg (ref 25.1–34.0)
MCHC: 33.2 g/dL (ref 31.5–36.0)
MCV: 92.5 fL (ref 79.5–101.0)
MONO#: 0.5 10*3/uL (ref 0.1–0.9)
MONO%: 2.1 % (ref 0.0–14.0)
NEUT%: 14.4 % — AB (ref 38.4–76.8)
NEUTROS ABS: 3.4 10*3/uL (ref 1.5–6.5)
PLATELETS: 229 10*3/uL (ref 145–400)
RBC: 3.94 10*6/uL (ref 3.70–5.45)
RDW: 15.6 % — ABNORMAL HIGH (ref 11.2–14.5)
WBC: 23.7 10*3/uL — AB (ref 3.9–10.3)
lymph#: 19.3 10*3/uL — ABNORMAL HIGH (ref 0.9–3.3)

## 2016-10-26 LAB — TECHNOLOGIST REVIEW

## 2016-10-27 LAB — IGG, IGA, IGM
IGA/IMMUNOGLOBULIN A, SERUM: 178 mg/dL (ref 87–352)
IgG, Qn, Serum: 910 mg/dL (ref 700–1600)
IgM, Qn, Serum: 45 mg/dL (ref 26–217)

## 2016-10-27 LAB — BETA 2 MICROGLOBULIN, SERUM: Beta-2: 3.4 mg/L — ABNORMAL HIGH (ref 0.6–2.4)

## 2016-11-03 ENCOUNTER — Other Ambulatory Visit: Payer: BLUE CROSS/BLUE SHIELD

## 2016-11-10 ENCOUNTER — Ambulatory Visit (HOSPITAL_BASED_OUTPATIENT_CLINIC_OR_DEPARTMENT_OTHER): Payer: BLUE CROSS/BLUE SHIELD | Admitting: Oncology

## 2016-11-10 VITALS — BP 121/55 | HR 68 | Temp 98.0°F | Resp 20 | Ht 62.0 in | Wt 133.1 lb

## 2016-11-10 DIAGNOSIS — C8580 Other specified types of non-Hodgkin lymphoma, unspecified site: Secondary | ICD-10-CM

## 2016-11-10 DIAGNOSIS — C8584 Other specified types of non-Hodgkin lymphoma, lymph nodes of axilla and upper limb: Secondary | ICD-10-CM | POA: Diagnosis not present

## 2016-11-10 NOTE — Progress Notes (Signed)
[facility]  [phone] [phone]     ID: Dana Butler DOB: March 25, 1956  MR#: [id number]  JYN#:[id number]  Patient Care Team: Cari Caraway, MD as PCP - General (Family Medicine) Jodi Marble, MD as Consulting Physician (Otolaryngology) Armandina Gemma, MD as Consulting Physician (General Surgery) Kennith Center, RD as Dietitian (Family Medicine) PCP: Cari Caraway, MD OTHER MD: Barrie Dunker MD  CHIEF COMPLAINT: nodal marginal zone non-Hodgkin's lymphoma  CURRENT TREATMENT: Observation   HISTORY OF PRESENT ILNESS: From the 07/20/2014 intake note:  Dana Butler had some "shooting pains across her neck" earliy in 2016, and saw ENT regarding this. There was a question of mild cervical adenopathy noted at that time. More recently, she underwent screening mammography 05/30/2014 at the [facility]. This suggested enlarged lymph nodes in the left axilla and the patient was recalled for left axillary ultrasound 06/08/2014. There were 2 enlarged lymph nodes, one measuring 2.4 cm, retaining its fatty hilum, but with the cortex measuring 4.5 mm. Posterior to that there was a second lymph node measuring 3.1 cm, with no internal fat. There was no mass noted in the upper outer quadrant of the left breast and review of the earlier mammogram showed no mammographic abnormality in either breast.  Biopsy of one of the left axillary lymph nodes 06/19/2014 showed ([id number]) a relatively monomorphic population of small round to slightly irregular lymphocytes, with dense chromatin and inconspicuous nucleoli. Flow cytometry ([id number]) showed a lambda restricted B-cell population expressing CD20, but not CD5, CD10 or cycling D1. Ki-67 was less than 10%. A preliminary diagnosis of B-cell non-Hodgkin's lymphoma, low-grade, was made..  On 06/29/2014 the patient underwent left axillary excisional biopsy with a total of 4 lymph nodes removed. The pathology from this more  extensive sample showed ([id number]) effacement of the lymph node architecture by the lymphoma cells previously described, with only scattered larger lymphoid cells and histiocytes and no significant plasma cell component. There were scattered mitotic figures and no necrosis. Repeat flow cytometry showed most of the lymphocytes were CD20, CD23 and CD79a positive, with coexpression of CD43 but not CD5, CD10, BCL-6 or cyclin D1. The cells were BCL-2 positive. Overall it was pathology's impression that this was a low-grade B-cell non-Hodgkin's lymphoma, marginal zone type.  On 07/19/2014 Dana Butler underwent PET scanning which showed bilateral cervical adenopathy, bilateral axillary adenopathy, some small left common iliac lymph nodes and a normal spleen. There was very low metabolic activity in all the lymph nodes noted and similarly in the pelvic marrow suggesting possible marrow involvement.  The patient's subsequent history is as detailed below  INTERVAL HISTORY: Dana Butler returns today for follow-up of her B cell non-Hodgkin's nodal marginal zone lymphoma. She is doing "good". She says she has excellent energy, walks every day, does yoga several times a week, and spends a good deal of time gardening. She and her husband Zan have a trip planned to [place] in their RV in August.  REVIEW OF SYSTEMS: She checks her adenopathy and does not feel it has changed. There has been no unexplained weight loss or fatigue, pruritus, rash, fever, or drenching sweats. They recently got an [place] cocker spaniel 16 months old, and both she and her husband have developed a cough. They wonder if it's related to allergies. They have had dogs before without this problem. Aside from these issues a detailed review of systems today was benign.  PAST MEDICAL HISTORY: Past Medical History:  Diagnosis Date  . Medical history non-contributory  PAST SURGICAL HISTORY: Past Surgical History:  Procedure Laterality Date  .  ABDOMINAL HYSTERECTOMY    . AXILLARY LYMPH NODE BIOPSY Left 06/29/2014   Procedure: AXILLARY LYMPH NODE BIOPSY;  Surgeon: Armandina Gemma, MD;  Location: [facility];  Service: General;  Laterality: Left;  . COLONOSCOPY    . DIAGNOSTIC LAPAROSCOPY  2005   BSO    FAMILY HISTORY Family History  Problem Relation Age of Onset  . Breast cancer Paternal 57    The patient's father died at the age of 8, the patient's mother at age 4. The patient has 2 brothers, no sisters. Dana Butler's maternal grandmother had a diagnosis of leukemia, type unknown, in her 32s. A maternal cousin has a history of lymphoma.  GYNECOLOGIC HISTORY:  No LMP recorded. Patient has had a hysterectomy. Menarche age 13, first live birth age 67, the patient is GX P3. She underwent a hysterectomy with bilateral salpingo-oophorectomy at age 57. She continues on hormone replacement.  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 Her husband "Zan" just retired from anesthesia July 2015. (Incidentally he was the anesthesiologist during my prostatectomy 2004). Their children are currently age 28, 15, and 53. There are no grandchildren. The patient attends first [place]: In place   HEALTH MAINTENANCE: Social History  Substance Use Topics  . Smoking status: Never Smoker  . Smokeless tobacco: Not on file  . Alcohol use No     Colonoscopy:  PAP:  Bone density:  Lipid panel:  Allergies  Allergen Reactions  . Latex Rash  . Tape Rash    Current Outpatient Prescriptions  Medication Sig Dispense Refill  . ALPRAZolam (XANAX) 0.5 MG tablet     . calcium carbonate (OS-CAL) 1250 (500 CA) MG chewable tablet Chew by mouth.    . Cholecalciferol (VITAMIN D3) 5000 UNITS TABS Take by mouth.    . estradiol (VIVELLE-DOT) 0.05 MG/24HR patch Place 1 patch onto the skin 2 (two) times a week.    . Fish Oil OIL Take by mouth.    . Glucosamine-Chondroitin  (GLUCOSAMINE CHONDR COMPLEX PO) Take by mouth.    [name] ibuprofen (ADVIL,MOTRIN) 200 MG tablet Take 200 mg by mouth.    . Multiple Vitamins-Minerals (MULTIVITAMIN WITH MINERALS) tablet Take 1 tablet by mouth daily.    . Zaleplon (SONATA PO) Take by mouth.     No current facility-administered medications for this visit.     OBJECTIVE: Middle-aged white woman In no acute distress  Vitals:   11/10/16 1227  BP: (!) 121/55  Pulse: 68  Resp: 20  Temp: 98 F (36.7 C)     Body mass index is 24.34 kg/m.    ECOG FS:0 - Asymptomatic  Sclerae unicteric, EOMs intact Oropharynx clear and moist No cervical or supraclavicular adenopathy; bilateral axillary adenopathy, measuring between 1 and 2 cm, movable, nontender, unchanged from prior; minimal left inguinal adenopathy Lungs no rales or rhonchi Heart regular rate and rhythm Abd soft, nontender, positive bowel sounds MSK no focal spinal tenderness, no upper extremity lymphedema Neuro: nonfocal, well oriented, appropriate affect Breasts: Deferred   LAB RESULTS:  CMP     Component Value Date/Time   NA 143 10/26/2016 0934   K 4.4 10/26/2016 0934   CO2 28 10/26/2016 0934   GLUCOSE 98 10/26/2016 0934   BUN 12.2 10/26/2016 0934   CREATININE 0.8 10/26/2016 0934   CALCIUM 9.4 10/26/2016 0934   PROT 6.4 10/26/2016  0934   ALBUMIN 3.6 10/26/2016 0934   AST 32 10/26/2016 0934   ALT 26 10/26/2016 0934   ALKPHOS 152 (H) 10/26/2016 0934   BILITOT 0.36 10/26/2016 0934    INo results found for: SPEP, UPEP  Lab Results  Component Value Date   WBC 23.7 (H) 10/26/2016   NEUTROABS 3.4 10/26/2016   HGB 12.1 10/26/2016   HCT 36.5 10/26/2016   MCV 92.5 10/26/2016   PLT 229 10/26/2016      Chemistry      Component Value Date/Time   NA 143 10/26/2016 0934   K 4.4 10/26/2016 0934   CO2 28 10/26/2016 0934   BUN 12.2 10/26/2016 0934   CREATININE 0.8 10/26/2016 0934      Component Value Date/Time   CALCIUM 9.4 10/26/2016 0934   ALKPHOS 152  (H) 10/26/2016 0934   AST 32 10/26/2016 0934   ALT 26 10/26/2016 0934   BILITOT 0.36 10/26/2016 0934     The total white cell count is slowly creeping upward, was 21,003 months ago and is 23.7 thousand now. The lymphocytes have increased from 6.0 a year ago to 15.46 months ago, to 19.3 currently. Review of the blood films shows some smudge cells. IgG, IgA and IgM were all entirely normal. Beta-2 microglobulin has minimally increased, from 2.5 year ago to 3.06 months ago to 3.4 currently.  No results found for: LABCA2  No components found for: [id number]  No results for input(s): INR in the last 168 hours.  Urinalysis No results found for: COLORURINE, APPEARANCEUR, LABSPEC, PHURINE, GLUCOSEU, HGBUR, BILIRUBINUR, KETONESUR, PROTEINUR, UROBILINOGEN, NITRITE, LEUKOCYTESUR  STUDIES: Bilateral screening mammography with tomography at the [facility] 06/22/2016 finds the breast density to be category B. There was no evidence of malignancy.  ASSESSMENT: 61 y.o. [place] woman s/p left axillary lymph node biopsy 06/29/2014 for a B-cell non-Hodgkin's lymphoma, consistent with marginal zone nodal lymphoma, advanced stage  (1) signed up for [facility] 08/29/2014  (2) consider rituximab or ibrutinib if symptoms or cytopenias develop  (a)  serology at [facility] 08/29/2014 was negative for hepatitis C antibody, hepatitis B core antibody, hepatitis B surface antigen, hepatitis B surface antibody, or HIV antigen or antibody   PLAN: Vista Sawatzky is now a little over a year out from definitive diagnosis of her B-cell marginal zone lymphoma, with no indications to initiate treatment.  I had more difficulty palpating any lymph nodes in her neck or supraclavicular areas, the easiest areas to feel being both axillae. There has been really no significant change.  She has no "B symptoms", and no cytopenias.  Her total lymphocyte count is slowly climbing. She understands these ourselves that "don't know how to  die". When we want to help them committed suicide most likely we will start her on ibrutinib, which does a good job of clearing the lymph nodes. At this point though she needs only follow-up. She will have lab work in October and she will see me again in April of next year.  She knows to call for any other problems that may develop before that.  Chauncey Cruel, MD   11/11/2016 8:47 AM Medical Oncology and Hematology [facility] [address] [place], [place] [address] Tel. [phone]    Fax. [phone]

## 2017-02-09 ENCOUNTER — Telehealth: Payer: Self-pay

## 2017-02-09 NOTE — Telephone Encounter (Signed)
Pt asked about getting flu and shingle shot. This RN knows OK for flu but unsure about Dr's opinion on shingles shot for this pt.

## 2017-02-10 ENCOUNTER — Other Ambulatory Visit (HOSPITAL_BASED_OUTPATIENT_CLINIC_OR_DEPARTMENT_OTHER): Payer: BLUE CROSS/BLUE SHIELD

## 2017-02-10 DIAGNOSIS — C8584 Other specified types of non-Hodgkin lymphoma, lymph nodes of axilla and upper limb: Secondary | ICD-10-CM | POA: Diagnosis not present

## 2017-02-10 DIAGNOSIS — C8580 Other specified types of non-Hodgkin lymphoma, unspecified site: Secondary | ICD-10-CM

## 2017-02-10 LAB — CBC WITH DIFFERENTIAL/PLATELET
BASO%: 0.2 % (ref 0.0–2.0)
Basophils Absolute: 0.1 10*3/uL (ref 0.0–0.1)
EOS%: 1.2 % (ref 0.0–7.0)
Eosinophils Absolute: 0.4 10*3/uL (ref 0.0–0.5)
HCT: 37.9 % (ref 34.8–46.6)
HGB: 12.6 g/dL (ref 11.6–15.9)
LYMPH%: 86 % — ABNORMAL HIGH (ref 14.0–49.7)
MCH: 31 pg (ref 25.1–34.0)
MCHC: 33.2 g/dL (ref 31.5–36.0)
MCV: 93.3 fL (ref 79.5–101.0)
MONO#: 0.6 10*3/uL (ref 0.1–0.9)
MONO%: 1.9 % (ref 0.0–14.0)
NEUT#: 3.2 10*3/uL (ref 1.5–6.5)
NEUT%: 10.7 % — ABNORMAL LOW (ref 38.4–76.8)
NRBC: 0 % (ref 0–0)
Platelets: 229 10*3/uL (ref 145–400)
RBC: 4.06 10*6/uL (ref 3.70–5.45)
RDW: 16.6 % — AB (ref 11.2–14.5)
WBC: 30.1 10*3/uL — AB (ref 3.9–10.3)
lymph#: 25.9 10*3/uL — ABNORMAL HIGH (ref 0.9–3.3)

## 2017-02-10 LAB — COMPREHENSIVE METABOLIC PANEL
ALT: 21 U/L (ref 0–55)
AST: 30 U/L (ref 5–34)
Albumin: 4 g/dL (ref 3.5–5.0)
Alkaline Phosphatase: 118 U/L (ref 40–150)
Anion Gap: 6 mEq/L (ref 3–11)
BUN: 17 mg/dL (ref 7.0–26.0)
CHLORIDE: 109 meq/L (ref 98–109)
CO2: 25 meq/L (ref 22–29)
Calcium: 9.5 mg/dL (ref 8.4–10.4)
Creatinine: 0.8 mg/dL (ref 0.6–1.1)
GLUCOSE: 94 mg/dL (ref 70–140)
POTASSIUM: 3.8 meq/L (ref 3.5–5.1)
SODIUM: 140 meq/L (ref 136–145)
TOTAL PROTEIN: 6.9 g/dL (ref 6.4–8.3)
Total Bilirubin: 0.44 mg/dL (ref 0.20–1.20)

## 2017-02-10 LAB — TECHNOLOGIST REVIEW

## 2017-02-11 LAB — IGG, IGA, IGM
IGA/IMMUNOGLOBULIN A, SERUM: 187 mg/dL (ref 87–352)
IGG (IMMUNOGLOBIN G), SERUM: 887 mg/dL (ref 700–1600)
IGM (IMMUNOGLOBIN M), SRM: 46 mg/dL (ref 26–217)

## 2017-02-11 LAB — BETA 2 MICROGLOBULIN, SERUM: Beta-2: 2.7 mg/L — ABNORMAL HIGH (ref 0.6–2.4)

## 2017-05-17 ENCOUNTER — Other Ambulatory Visit: Payer: Self-pay | Admitting: Oncology

## 2017-05-17 DIAGNOSIS — Z1231 Encounter for screening mammogram for malignant neoplasm of breast: Secondary | ICD-10-CM

## 2017-06-14 DIAGNOSIS — Z13820 Encounter for screening for osteoporosis: Secondary | ICD-10-CM | POA: Diagnosis not present

## 2017-06-14 DIAGNOSIS — Z01419 Encounter for gynecological examination (general) (routine) without abnormal findings: Secondary | ICD-10-CM | POA: Diagnosis not present

## 2017-06-14 DIAGNOSIS — Z6824 Body mass index (BMI) 24.0-24.9, adult: Secondary | ICD-10-CM | POA: Diagnosis not present

## 2017-06-23 ENCOUNTER — Ambulatory Visit: Payer: BLUE CROSS/BLUE SHIELD

## 2017-07-07 DIAGNOSIS — Z23 Encounter for immunization: Secondary | ICD-10-CM | POA: Diagnosis not present

## 2017-07-08 ENCOUNTER — Ambulatory Visit
Admission: RE | Admit: 2017-07-08 | Discharge: 2017-07-08 | Disposition: A | Discharge status: Home / Self Care | Payer: BLUE CROSS/BLUE SHIELD | Source: Ambulatory Visit | Attending: Oncology | Admitting: Oncology

## 2017-07-08 DIAGNOSIS — Z1231 Encounter for screening mammogram for malignant neoplasm of breast: Secondary | ICD-10-CM | POA: Diagnosis not present

## 2017-08-02 ENCOUNTER — Other Ambulatory Visit: Payer: Self-pay | Admitting: *Deleted

## 2017-08-02 DIAGNOSIS — M8580 Other specified disorders of bone density and structure, unspecified site: Secondary | ICD-10-CM

## 2017-08-02 DIAGNOSIS — C8580 Other specified types of non-Hodgkin lymphoma, unspecified site: Secondary | ICD-10-CM

## 2017-08-04 ENCOUNTER — Inpatient Hospital Stay: Payer: BLUE CROSS/BLUE SHIELD | Attending: Oncology

## 2017-08-04 DIAGNOSIS — C884 Extranodal marginal zone B-cell lymphoma of mucosa-associated lymphoid tissue [MALT-lymphoma]: Secondary | ICD-10-CM | POA: Insufficient documentation

## 2017-08-04 DIAGNOSIS — M8580 Other specified disorders of bone density and structure, unspecified site: Secondary | ICD-10-CM

## 2017-08-04 DIAGNOSIS — Z79899 Other long term (current) drug therapy: Secondary | ICD-10-CM | POA: Insufficient documentation

## 2017-08-04 DIAGNOSIS — C8580 Other specified types of non-Hodgkin lymphoma, unspecified site: Secondary | ICD-10-CM

## 2017-08-04 LAB — CMP (CANCER CENTER ONLY)
ALT: 26 U/L (ref 0–55)
AST: 32 U/L (ref 5–34)
Albumin: 3.8 g/dL (ref 3.5–5.0)
Alkaline Phosphatase: 126 U/L (ref 40–150)
Anion gap: 8 (ref 3–11)
BUN: 16 mg/dL (ref 7–26)
CHLORIDE: 105 mmol/L (ref 98–109)
CO2: 26 mmol/L (ref 22–29)
Calcium: 9.8 mg/dL (ref 8.4–10.4)
Creatinine: 0.79 mg/dL (ref 0.60–1.10)
GFR, Est AFR Am: >60 mL/min (ref >60)
GFR, Estimated: >60 mL/min (ref >60)
GLUCOSE: 88 mg/dL (ref 70–140)
Potassium: 4 mmol/L (ref 3.5–5.1)
SODIUM: 139 mmol/L (ref 136–145)
Total Bilirubin: 0.5 mg/dL (ref 0.2–1.2)
Total Protein: 6.5 g/dL (ref 6.4–8.3)

## 2017-08-04 LAB — CBC WITH DIFFERENTIAL (CANCER CENTER ONLY)
BASOS ABS: 0.1 10*3/uL (ref 0.0–0.1)
Basophils Relative: 0 %
EOS ABS: 0.5 10*3/uL (ref 0.0–0.5)
EOS PCT: 1 %
HCT: 36.2 % (ref 34.8–46.6)
Hemoglobin: 11.9 g/dL (ref 11.6–15.9)
Lymphocytes Relative: 91 %
Lymphs Abs: 34.3 10*3/uL — ABNORMAL HIGH (ref 0.9–3.3)
MCH: 31.1 pg (ref 25.1–34.0)
MCHC: 32.9 g/dL (ref 31.5–36.0)
MCV: 94.5 fL (ref 79.5–101.0)
Monocytes Absolute: 0.6 10*3/uL (ref 0.1–0.9)
Monocytes Relative: 2 %
Neutro Abs: 2.4 10*3/uL (ref 1.5–6.5)
Neutrophils Relative %: 6 %
PLATELETS: 218 10*3/uL (ref 145–400)
RBC: 3.83 MIL/uL (ref 3.70–5.45)
RDW: 16.1 % — ABNORMAL HIGH (ref 11.2–14.5)
WBC: 37.8 10*3/uL — AB (ref 3.9–10.3)

## 2017-08-05 LAB — IGG, IGA, IGM
IGA: 163 mg/dL (ref 87–352)
IGG (IMMUNOGLOBIN G), SERUM: 876 mg/dL (ref 700–1600)
IgM (Immunoglobulin M), Srm: 39 mg/dL (ref 26–217)

## 2017-08-05 LAB — VITAMIN D 25 HYDROXY (VIT D DEFICIENCY, FRACTURES): Vit D, 25-Hydroxy: 40.9 ng/mL (ref 30.0–100.0)

## 2017-08-05 LAB — BETA 2 MICROGLOBULIN, SERUM: Beta-2 Microglobulin: 2.8 mg/L — ABNORMAL HIGH (ref 0.6–2.4)

## 2017-08-09 NOTE — Progress Notes (Signed)
[facility]  [phone] Fax:(336) [phone]     ID: Dana Butler DOB: Jul 02, 1955  MR#: [id number]  NPY#:[id number]  Patient Care Team: Cari Caraway, MD as PCP - General (Family Medicine) Jodi Marble, MD as Consulting Physician (Otolaryngology) Armandina Gemma, MD as Consulting Physician (General Surgery) Kennith Center, RD as Dietitian (Family Medicine) PCP: Cari Caraway, MD OTHER MD: Barrie Dunker MD  CHIEF COMPLAINT: nodal marginal zone non-Hodgkin's lymphoma  CURRENT TREATMENT: Observation   HISTORY OF PRESENT ILNESS: From the 07/20/2014 intake note:  Dana Butler had some "shooting pains across her neck" earliy in 2016, and saw ENT regarding this. There was a question of mild cervical adenopathy noted at that time. More recently, she underwent screening mammography 05/30/2014 at the [facility]. This suggested enlarged lymph nodes in the left axilla and the patient was recalled for left axillary ultrasound 06/08/2014. There were 2 enlarged lymph nodes, one measuring 2.4 cm, retaining its fatty hilum, but with the cortex measuring 4.5 mm. Posterior to that there was a second lymph node measuring 3.1 cm, with no internal fat. There was no mass noted in the upper outer quadrant of the left breast and review of the earlier mammogram showed no mammographic abnormality in either breast.  Biopsy of one of the left axillary lymph nodes 06/19/2014 showed ([id number]) a relatively monomorphic population of small round to slightly irregular lymphocytes, with dense chromatin and inconspicuous nucleoli. Flow cytometry ([id number] [id number]) showed a lambda restricted B-cell population expressing CD20, but not CD5, CD10 or cycling D1. Ki-67 was less than 10%. A preliminary diagnosis of B-cell non-Hodgkin's lymphoma, low-grade, was made..  On 06/29/2014 the patient underwent left axillary excisional biopsy with a total of 4 lymph nodes removed. The pathology from this more  extensive sample showed ([id number]) effacement of the lymph node architecture by the lymphoma cells previously described, with only scattered larger lymphoid cells and histiocytes and no significant plasma cell component. There were scattered mitotic figures and no necrosis. Repeat flow cytometry showed most of the lymphocytes were CD20, CD23 and CD79a positive, with coexpression of CD43 but not CD5, CD10, BCL-6 or cyclin D1. The cells were BCL-2 positive. Overall it was pathology's impression that this was a low-grade B-cell non-Hodgkin's lymphoma, marginal zone type.  On 07/19/2014 [name] underwent PET scanning which showed bilateral cervical adenopathy, bilateral axillary adenopathy, some small left common iliac lymph nodes and a normal spleen. There was very low metabolic activity in all the lymph nodes noted and similarly in the pelvic marrow suggesting possible marrow involvement.  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 She notes that her energy levels are good. She denies fevers, drenching sweats, or chills.  She has not noted any significant change in her palpable adenopathy.  Have been no intercurrent infections.  Also she received the shingles and flu vaccines without too many side effects.  Since her last visit, she underwent diagnostic bilateral mammography with CAD and tomography on 07/08/2017 at [facility] showing: breast density category B. There was no evidence of malignancy.    REVIEW OF SYSTEMS: Hafsa reports that she and her family are doing well. She and her husband will be visiting [place] for 3.5 months and will drive there with an RV. [place]. For exercise,  she walks 30-40 minutes every morning. She also does yoga and strength classes once a week. She  denies unusual headaches, visual changes, nausea, vomiting, or dizziness. There has been no unusual cough, phlegm production, or pleurisy. This been no change in bowel or bladder habits. She denies unexplained fatigue or unexplained weight loss, bleeding, rash, or fever. A detailed review of systems was otherwise stable.    PAST MEDICAL HISTORY: Past Medical History:  Diagnosis Date  . Medical history non-contributory     PAST SURGICAL HISTORY: Past Surgical History:  Procedure Laterality Date  . ABDOMINAL HYSTERECTOMY    . AXILLARY LYMPH NODE BIOPSY Left 06/29/2014   Procedure: AXILLARY LYMPH NODE BIOPSY;  Surgeon: Armandina Gemma, MD;  Location: [facility];  Service: General;  Laterality: Left;  . COLONOSCOPY    . DIAGNOSTIC LAPAROSCOPY  2005   BSO    FAMILY HISTORY Family History  Problem Relation Age of Onset  . Breast cancer Paternal 78    The patient's father died at the age of 51, the patient's mother at age 78. The patient has 2 brothers, no sisters. Dana Butler's maternal grandmother had a diagnosis of leukemia, type unknown, in her 65s. A maternal cousin has a history of lymphoma.  GYNECOLOGIC HISTORY:  No LMP recorded. Patient has had a hysterectomy. Menarche age 11, first live birth age 11, the patient is GX P3. She underwent a hysterectomy with bilateral salpingo-oophorectomy at age 38. She continues on hormone replacement.  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 Her husband "Dana Butler" just retired from anesthesia July 2015. (Incidentally he was the anesthesiologist during my prostatectomy 2004). Their children are currently age 31, 32, and 66. There are no grandchildren. The patient attends first [place]: In place   HEALTH MAINTENANCE: Social History   Tobacco Use  . Smoking status: Never Smoker  Substance Use Topics  . Alcohol use: No  . Drug use: No     Colonoscopy:  PAP:  Bone  density:  Lipid panel:  Allergies  Allergen Reactions  . Latex Rash  . Tape Rash    Current Outpatient Medications  Medication Sig Dispense Refill  . ALPRAZolam (XANAX) 0.5 MG tablet     . calcium carbonate (OS-CAL) 1250 (500 CA) MG chewable tablet Chew by mouth.    . Cholecalciferol (VITAMIN D3) 5000 UNITS TABS Take by mouth.    . estradiol (VIVELLE-DOT) 0.05 MG/24HR patch Place 1 patch onto the skin 2 (two) times a week.    . Fish Oil OIL Take by mouth.    . Glucosamine-Chondroitin (GLUCOSAMINE CHONDR COMPLEX PO) Take by mouth.    [name] ibuprofen (ADVIL,MOTRIN) 200 MG tablet Take 200 mg by mouth.    . Multiple Vitamins-Minerals (MULTIVITAMIN WITH MINERALS) tablet Take 1 tablet by mouth daily.    . Zaleplon (SONATA PO) Take by mouth.     No current facility-administered medications for this visit.     OBJECTIVE: Middle-aged white woman who appears well  Vitals:   08/11/17 1248  BP: (!) 110/56  Pulse: 62  Resp: 18  Temp: 98.2 F (36.8 C)  SpO2: 98%     Body mass index is 23.37 kg/m.    ECOG FS:0 - Asymptomatic  Sclerae unicteric, pupils round and equal Oropharynx clear and moist There are bilateral cervical lymph nodes, slightly larger on the right, the largest less than a centimeter; there are bilateral axillary lymph nodes, the  one on the right measuring slightly over a centimeter; there is bilateral very small inguinal adenopathy. Lungs no rales or rhonchi Heart regular rate and rhythm Abd soft, nontender, positive bowel sounds MSK no focal spinal tenderness, no upper extremity lymphedema Neuro: nonfocal, well oriented, appropriate affect Breasts: No masses palpated in either breast, no skin or nipple changes of concern.    LAB RESULTS:  CMP     Component Value Date/Time   NA 139 08/04/2017 1242   NA 140 02/10/2017 1352   K 4.0 08/04/2017 1242   K 3.8 02/10/2017 1352   CL 105 08/04/2017 1242   CO2 26 08/04/2017 1242   CO2 25 02/10/2017 1352   GLUCOSE 88  08/04/2017 1242   GLUCOSE 94 02/10/2017 1352   BUN 16 08/04/2017 1242   BUN 17.0 02/10/2017 1352   CREATININE 0.79 08/04/2017 1242   CREATININE 0.8 02/10/2017 1352   CALCIUM 9.8 08/04/2017 1242   CALCIUM 9.5 02/10/2017 1352   PROT 6.5 08/04/2017 1242   PROT 6.9 02/10/2017 1352   ALBUMIN 3.8 08/04/2017 1242   ALBUMIN 4.0 02/10/2017 1352   AST 32 08/04/2017 1242   AST 30 02/10/2017 1352   ALT 26 08/04/2017 1242   ALT 21 02/10/2017 1352   ALKPHOS 126 08/04/2017 1242   ALKPHOS 118 02/10/2017 1352   BILITOT 0.5 08/04/2017 1242   BILITOT 0.44 02/10/2017 1352   GFRNONAA >60 08/04/2017 1242   GFRAA >60 08/04/2017 1242    INo results found for: SPEP, UPEP  Lab Results  Component Value Date   WBC 37.8 (H) 08/04/2017   NEUTROABS 2.4 08/04/2017   HGB 12.6 02/10/2017   HCT 36.2 08/04/2017   MCV 94.5 08/04/2017   PLT 218 08/04/2017      Chemistry      Component Value Date/Time   NA 139 08/04/2017 1242   NA 140 02/10/2017 1352   K 4.0 08/04/2017 1242   K 3.8 02/10/2017 1352   CL 105 08/04/2017 1242   CO2 26 08/04/2017 1242   CO2 25 02/10/2017 1352   BUN 16 08/04/2017 1242   BUN 17.0 02/10/2017 1352   CREATININE 0.79 08/04/2017 1242   CREATININE 0.8 02/10/2017 1352      Component Value Date/Time   CALCIUM 9.8 08/04/2017 1242   CALCIUM 9.5 02/10/2017 1352   ALKPHOS 126 08/04/2017 1242   ALKPHOS 118 02/10/2017 1352   AST 32 08/04/2017 1242   AST 30 02/10/2017 1352   ALT 26 08/04/2017 1242   ALT 21 02/10/2017 1352   BILITOT 0.5 08/04/2017 1242   BILITOT 0.44 02/10/2017 1352     Immunoglobulins remain normal.  The beta-2 microglobulin has actually decreased slightly.  The vitamin D 25-hydroxy level is adequate at above 40  No results found for: LABCA2  No components found for: LABCA125  No results for input(s): INR in the last 168 hours.  Urinalysis No results found for: COLORURINE, APPEARANCEUR, LABSPEC, PHURINE, GLUCOSEU, HGBUR, BILIRUBINUR, KETONESUR,  PROTEINUR, UROBILINOGEN, NITRITE, LEUKOCYTESUR  STUDIES: Since her last visit, she underwent diagnostic bilateral mammography with CAD and tomography on 07/08/2017 at [facility] showing: breast density category B. There was no evidence of malignancy.   ASSESSMENT: 62 y.o. 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  There have been no "B" symptoms, there has been no anemia or thrombocytopenia, and there has been no doubling of the absolute lymphocyte count within 6 months.  Ensure there have been no indications for treatment  Accordingly we are continuing with observation alone.  Note that she will be in [place] from May through August.  She will return to see me in October.  We will repeat lab work a week before that.    She knows to call for any other issues that may develop before that visit.  Markanthony Gedney, Virgie Dad, MD  08/11/17 1:10 PM Medical Oncology and Hematology [facility] [address] [place], [place] [address] Tel. [phone]    Fax. [phone]  This document serves as a record of services personally performed by Lurline Del, MD. It was created on his behalf by Sheron Nightingale, a trained medical scribe. The creation of this record is based on the scribe's personal observations and the provider's statements to them.   I have reviewed the above documentation for accuracy and completeness, and I agree with the  above.

## 2017-08-11 ENCOUNTER — Telehealth: Payer: Self-pay | Admitting: Oncology

## 2017-08-11 ENCOUNTER — Inpatient Hospital Stay: Payer: BLUE CROSS/BLUE SHIELD | Admitting: Oncology

## 2017-08-11 VITALS — BP 110/56 | HR 62 | Temp 98.2°F | Resp 18 | Ht 62.0 in | Wt 127.8 lb

## 2017-08-11 DIAGNOSIS — Z79899 Other long term (current) drug therapy: Secondary | ICD-10-CM | POA: Diagnosis not present

## 2017-08-11 DIAGNOSIS — C884 Extranodal marginal zone B-cell lymphoma of mucosa-associated lymphoid tissue [MALT-lymphoma]: Secondary | ICD-10-CM | POA: Diagnosis not present

## 2017-08-11 DIAGNOSIS — C8580 Other specified types of non-Hodgkin lymphoma, unspecified site: Secondary | ICD-10-CM

## 2017-08-11 NOTE — Telephone Encounter (Signed)
Gave patient AVs and calendar of upcoming October appointments.  °

## 2018-02-01 DIAGNOSIS — Z23 Encounter for immunization: Secondary | ICD-10-CM | POA: Diagnosis not present

## 2018-02-09 ENCOUNTER — Inpatient Hospital Stay: Payer: BLUE CROSS/BLUE SHIELD | Attending: Oncology

## 2018-02-09 DIAGNOSIS — D649 Anemia, unspecified: Secondary | ICD-10-CM | POA: Diagnosis not present

## 2018-02-09 DIAGNOSIS — C884 Extranodal marginal zone B-cell lymphoma of mucosa-associated lymphoid tissue [MALT-lymphoma]: Secondary | ICD-10-CM | POA: Insufficient documentation

## 2018-02-09 DIAGNOSIS — Z79899 Other long term (current) drug therapy: Secondary | ICD-10-CM | POA: Diagnosis not present

## 2018-02-09 DIAGNOSIS — C8580 Other specified types of non-Hodgkin lymphoma, unspecified site: Secondary | ICD-10-CM

## 2018-02-09 LAB — CBC WITH DIFFERENTIAL/PLATELET
Abs Immature Granulocytes: 0.03 10*3/uL (ref 0.00–0.07)
BASOS ABS: 0.1 10*3/uL (ref 0.0–0.1)
Basophils Relative: 0 %
EOS ABS: 0.4 10*3/uL (ref 0.0–0.5)
EOS PCT: 1 %
HCT: 35.2 % — ABNORMAL LOW (ref 36.0–46.0)
Hemoglobin: 11.5 g/dL — ABNORMAL LOW (ref 12.0–15.0)
Immature Granulocytes: 0 %
LYMPHS ABS: 25.9 10*3/uL — AB (ref 0.7–4.0)
Lymphocytes Relative: 89 %
MCH: 30.8 pg (ref 26.0–34.0)
MCHC: 32.7 g/dL (ref 30.0–36.0)
MCV: 94.4 fL (ref 80.0–100.0)
Monocytes Absolute: 0.3 10*3/uL (ref 0.1–1.0)
Monocytes Relative: 1 %
NRBC: 0 % (ref 0.0–0.2)
Neutro Abs: 2.6 10*3/uL (ref 1.7–7.7)
Neutrophils Relative %: 9 %
Platelets: 218 10*3/uL (ref 150–400)
RBC: 3.73 MIL/uL — AB (ref 3.87–5.11)
RDW: 16.5 % — AB (ref 11.5–15.5)
WBC: 29.2 10*3/uL — ABNORMAL HIGH (ref 4.0–10.5)

## 2018-02-09 LAB — COMPREHENSIVE METABOLIC PANEL
ALK PHOS: 129 U/L — AB (ref 38–126)
ALT: 26 U/L (ref 0–44)
ANION GAP: 8 (ref 5–15)
AST: 35 U/L (ref 15–41)
Albumin: 3.8 g/dL (ref 3.5–5.0)
BUN: 14 mg/dL (ref 8–23)
CALCIUM: 9.5 mg/dL (ref 8.9–10.3)
CO2: 28 mmol/L (ref 22–32)
Chloride: 106 mmol/L (ref 98–111)
Creatinine, Ser: 0.79 mg/dL (ref 0.44–1.00)
GFR calc non Af Amer: >60 mL/min (ref >60)
GLUCOSE: 94 mg/dL (ref 70–99)
POTASSIUM: 3.8 mmol/L (ref 3.5–5.1)
Sodium: 142 mmol/L (ref 135–145)
TOTAL PROTEIN: 6.4 g/dL — AB (ref 6.5–8.1)
Total Bilirubin: 0.5 mg/dL (ref 0.3–1.2)

## 2018-02-10 LAB — IGG, IGA, IGM
IGA: 180 mg/dL (ref 87–352)
IGG (IMMUNOGLOBIN G), SERUM: 881 mg/dL (ref 700–1600)
IGM (IMMUNOGLOBULIN M), SRM: 37 mg/dL (ref 26–217)

## 2018-02-10 LAB — BETA 2 MICROGLOBULIN, SERUM: BETA 2 MICROGLOBULIN: 3.1 mg/L — AB (ref 0.6–2.4)

## 2018-02-15 NOTE — Progress Notes (Signed)
[place]  [phone] [phone]     ID: Dana Butler DOB: 10/04/1955  MR#: [id number]  YCX#:[id number]  Patient Care Team: Cari Caraway, MD as PCP - General (Family Medicine) Jodi Marble, MD as Consulting Physician (Otolaryngology) Armandina Gemma, MD as Consulting Physician (General Surgery) Kennith Center, RD as Dietitian (Family Medicine) PCP: Cari Caraway, MD OTHER MD: Barrie Dunker MD  CHIEF COMPLAINT: nodal marginal zone non-Hodgkin's lymphoma  CURRENT TREATMENT: Observation   HISTORY OF PRESENT ILNESS: From the 07/20/2014 intake note:  Dana Butler had some "shooting pains across her neck" earliy in 2016, and saw ENT regarding this. There was a question of mild cervical adenopathy noted at that time. More recently, she underwent screening mammography 05/30/2014 at the [facility]. This suggested enlarged lymph nodes in the left axilla and the patient was recalled for left axillary ultrasound 06/08/2014. There were 2 enlarged lymph nodes, one measuring 2.4 cm, retaining its fatty hilum, but with the cortex measuring 4.5 mm. Posterior to that there was a second lymph node measuring 3.1 cm, with no internal fat. There was no mass noted in the upper outer quadrant of the left breast and review of the earlier mammogram showed no mammographic abnormality in either breast.  Biopsy of one of the left axillary lymph nodes 06/19/2014 showed ([id number]) a relatively monomorphic population of small round to slightly irregular lymphocytes, with dense chromatin and inconspicuous nucleoli. Flow cytometry ([id number]) showed a lambda restricted B-cell population expressing CD20, but not CD5, CD10 or cycling D1. Ki-67 was less than 10%. A preliminary diagnosis of B-cell non-Hodgkin's lymphoma, low-grade, was made..  On 06/29/2014 the patient underwent left axillary excisional biopsy with a total of 4 lymph nodes removed. The pathology from this more  extensive sample showed ([id number]) effacement of the lymph node architecture by the lymphoma cells previously described, with only scattered larger lymphoid cells and histiocytes and no significant plasma cell component. There were scattered mitotic figures and no necrosis. Repeat flow cytometry showed most of the lymphocytes were CD20, CD23 and CD79a positive, with coexpression of CD43 but not CD5, CD10, BCL-6 or cyclin D1. The cells were BCL-2 positive. Overall it was pathology's impression that this was a low-grade B-cell non-Hodgkin's lymphoma, marginal zone type.  On 07/19/2014 [name] underwent PET scanning which showed bilateral cervical adenopathy, bilateral axillary adenopathy, some small left common iliac lymph nodes and a normal spleen. There was very low metabolic activity in all the lymph nodes noted and similarly in the pelvic marrow suggesting possible marrow involvement.  The patient's subsequent history is as detailed below  INTERVAL HISTORY: Dana Butler returns today for follow-up of her B cell non-Hodgkin's nodal marginal zone lymphoma.  The interval history is quite unremarkable.  In particular she has excellent energy, minimal weight loss related to her excellent diet and exercise, no drenching sweats, no fevers, no rash, no pruritus.  She and Dana Butler recently came back from a [place]-monthtrip to [place] an [place].  They had a very good time and so a lot of wildlife  REVIEW OF SYSTEMS: Dana Butler reports no unusual headaches, visual changes,, cough, phlegm production, pleurisy, shortness of breath, or change in bowel or bladder habits.  A detailed review of systems today was stable.  PAST MEDICAL HISTORY: Past Medical History:  Diagnosis Date  . Medical history non-contributory     PAST SURGICAL HISTORY: Past Surgical History:  Procedure Laterality Date  . ABDOMINAL HYSTERECTOMY    . AXILLARY LYMPH NODE  BIOPSY Left 06/29/2014   Procedure: AXILLARY LYMPH NODE BIOPSY;  Surgeon: Armandina Gemma, MD;   Location: [facility];  Service: General;  Laterality: Left;  . COLONOSCOPY    . DIAGNOSTIC LAPAROSCOPY  2005   BSO    FAMILY HISTORY Family History  Problem Relation Age of Onset  . Breast cancer Paternal 39    The patient's father died at the age of 63, the patient's mother at age 71. The patient has 2 brothers, no sisters. Dana Butler's maternal grandmother had a diagnosis of leukemia, type unknown, in her 51s. A maternal cousin has a history of lymphoma.  GYNECOLOGIC HISTORY:  No LMP recorded. Patient has had a hysterectomy. Menarche age 64, first live birth age 36, the patient is GX P3. She underwent a hysterectomy with bilateral salpingo-oophorectomy at age 77. She continues on hormone replacement.  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 Her husband "Dana Butler" just retired from anesthesia July 2015. (Incidentally he was the anesthesiologist during my prostatectomy 2004). Their children are currently age 3, 48, and 56.  As of October 2019 they are expecting their second grandchild.  The patient attends [employer]   HEALTH MAINTENANCE: Social History   Tobacco Use  . Smoking status: Never Smoker  Substance Use Topics  . Alcohol use: No  . Drug use: No     Colonoscopy:  PAP:  Bone density:  Lipid panel:  Allergies  Allergen Reactions  . Latex Rash  . Tape Rash    Current Outpatient Medications  Medication Sig Dispense Refill  . ALPRAZolam (XANAX) 0.5 MG tablet     . calcium carbonate (OS-CAL) 1250 (500 CA) MG chewable tablet Chew by mouth.    . Cholecalciferol (VITAMIN D3) 5000 UNITS TABS Take by mouth.    . estradiol (VIVELLE-DOT) 0.05 MG/24HR patch Place 1 patch onto the skin 2 (two) times a week.    . Fish Oil OIL Take by mouth.    . Glucosamine-Chondroitin (GLUCOSAMINE CHONDR COMPLEX PO) Take by mouth.    [name] ibuprofen (ADVIL,MOTRIN) 200 MG tablet Take 200 mg by mouth.     . Multiple Vitamins-Minerals (MULTIVITAMIN WITH MINERALS) tablet Take 1 tablet by mouth daily.    . Zaleplon (SONATA PO) Take by mouth.     No current facility-administered medications for this visit.     OBJECTIVE: Middle-aged white woman in no acute distress  Vitals:   02/16/18 1323  BP: 140/69  Pulse: 65  Resp: 18  Temp: 97.8 F (36.6 C)  SpO2: 98%     Body mass index is 22.24 kg/m.    ECOG FS:0 - Asymptomatic   Sclerae unicteric, EOMs intact minimal anterior cervical adenopathy, no supraclavicular adenopathy; left axillary adenopathy approximately 1-1/2 cm, movable, rubbery, nontender; bilateral inguinal adenopathy, left greater than right, largest 2-1/2 cm on the left Lungs no rales or rhonchi Heart regular rate and rhythm Abd soft, nontender, positive bowel sounds, no splenomegaly MSK no focal spinal tenderness, no upper extremity lymphedema Neuro: nonfocal, well oriented, appropriate affect Breasts: Deferred    LAB RESULTS:  CMP     Component Value Date/Time   NA 142 02/09/2018 1341   NA 140 02/10/2017 1352   K 3.8 02/09/2018 1341   K 3.8 02/10/2017 1352   CL 106 02/09/2018 1341   CO2 28 02/09/2018 1341   CO2 25 02/10/2017 1352   GLUCOSE 94 02/09/2018 1341  GLUCOSE 94 02/10/2017 1352   BUN 14 02/09/2018 1341   BUN 17.0 02/10/2017 1352   CREATININE 0.79 02/09/2018 1341   CREATININE 0.79 08/04/2017 1242   CREATININE 0.8 02/10/2017 1352   CALCIUM 9.5 02/09/2018 1341   CALCIUM 9.5 02/10/2017 1352   PROT 6.4 (L) 02/09/2018 1341   PROT 6.9 02/10/2017 1352   ALBUMIN 3.8 02/09/2018 1341   ALBUMIN 4.0 02/10/2017 1352   AST 35 02/09/2018 1341   AST 32 08/04/2017 1242   AST 30 02/10/2017 1352   ALT 26 02/09/2018 1341   ALT 26 08/04/2017 1242   ALT 21 02/10/2017 1352   ALKPHOS 129 (H) 02/09/2018 1341   ALKPHOS 118 02/10/2017 1352   BILITOT 0.5 02/09/2018 1341   BILITOT 0.5 08/04/2017 1242   BILITOT 0.44 02/10/2017 1352   GFRNONAA >60 02/09/2018 1341     GFRNONAA >60 08/04/2017 1242   GFRAA >60 02/09/2018 1341   GFRAA >60 08/04/2017 1242    INo results found for: SPEP, UPEP  Lab Results  Component Value Date   WBC 29.2 (H) 02/09/2018   NEUTROABS 2.6 02/09/2018   HGB 11.5 (L) 02/09/2018   HCT 35.2 (L) 02/09/2018   MCV 94.4 02/09/2018   PLT 218 02/09/2018      Chemistry      Component Value Date/Time   NA 142 02/09/2018 1341   NA 140 02/10/2017 1352   K 3.8 02/09/2018 1341   K 3.8 02/10/2017 1352   CL 106 02/09/2018 1341   CO2 28 02/09/2018 1341   CO2 25 02/10/2017 1352   BUN 14 02/09/2018 1341   BUN 17.0 02/10/2017 1352   CREATININE 0.79 02/09/2018 1341   CREATININE 0.79 08/04/2017 1242   CREATININE 0.8 02/10/2017 1352      Component Value Date/Time   CALCIUM 9.5 02/09/2018 1341   CALCIUM 9.5 02/10/2017 1352   ALKPHOS 129 (H) 02/09/2018 1341   ALKPHOS 118 02/10/2017 1352   AST 35 02/09/2018 1341   AST 32 08/04/2017 1242   AST 30 02/10/2017 1352   ALT 26 02/09/2018 1341   ALT 26 08/04/2017 1242   ALT 21 02/10/2017 1352   BILITOT 0.5 02/09/2018 1341   BILITOT 0.5 08/04/2017 1242   BILITOT 0.44 02/10/2017 1352     Immunoglobulins remain normal.  The beta-2 microglobulin has actually decreased slightly.  The vitamin D 25-hydroxy level is adequate at above 40  No results found for: LABCA2  No components found for: LABCA125  No results for input(s): INR in the last 168 hours.  Urinalysis No results found for: COLORURINE, APPEARANCEUR, LABSPEC, PHURINE, GLUCOSEU, HGBUR, BILIRUBINUR, KETONESUR, PROTEINUR, UROBILINOGEN, NITRITE, LEUKOCYTESUR  STUDIES: Since her last visit, she underwent diagnostic bilateral mammography with CAD and tomography on 07/08/2017 at [facility] showing: breast density category B. There was no evidence of malignancy.   ASSESSMENT: 62 y.o. Mulat woman s/p left axillary lymph node biopsy 06/29/2014 for a B-cell non-Hodgkin's lymphoma, consistent with marginal zone nodal  lymphoma, advanced stage  (1) signed up for [facility] 08/29/2014  (2) consider rituximab or ibrutinib if symptoms or cytopenias develop  (a)  serology at [facility] 08/29/2014 was negative for hepatitis C antibody, hepatitis B core antibody, hepatitis B surface antigen, hepatitis B surface antibody, or HIV antigen or antibody  (b) status post zoster vaccine 2019   PLAN: Maicey is now 3-1/2 years out from definitive diagnosis of her marginal zone nodal non-Hodgkin's lymphoma, with no indications for treatment.  She does have minimal anemia at 11.5,  which might be a week indication.  The absolute lymphocyte count has not doubled in the last 6 months.  She is entirely asymptomatic and has an excellent functional status.  I think all we would be doing if we started treatment now is causing her side effects, without increasing survival  We are however going to start checking counts a little bit more frequently, every 4 months instead of every 6 months.  She will see me again in 1 year.  She knows to call for any other issues that may develop before then. Magrinat, Virgie Dad, MD  02/16/18 2:20 PM Medical Oncology and Hematology [facility] [address] [place], [place] [address] Tel. [phone]    Fax. [phone]    I, Soijett Blue am acting as scribe for Dr. Sarajane Jews C. Magrinat.  I, Lurline Del MD, have reviewed the above documentation for accuracy and completeness, and I agree with the above.

## 2018-02-16 ENCOUNTER — Inpatient Hospital Stay: Payer: BLUE CROSS/BLUE SHIELD | Admitting: Oncology

## 2018-02-16 ENCOUNTER — Telehealth: Payer: Self-pay | Admitting: Oncology

## 2018-02-16 VITALS — BP 140/69 | HR 65 | Temp 97.8°F | Resp 18 | Ht 62.0 in | Wt 121.6 lb

## 2018-02-16 DIAGNOSIS — C884 Extranodal marginal zone B-cell lymphoma of mucosa-associated lymphoid tissue [MALT-lymphoma]: Secondary | ICD-10-CM

## 2018-02-16 DIAGNOSIS — D649 Anemia, unspecified: Secondary | ICD-10-CM

## 2018-02-16 DIAGNOSIS — Z79899 Other long term (current) drug therapy: Secondary | ICD-10-CM | POA: Diagnosis not present

## 2018-02-16 DIAGNOSIS — C8580 Other specified types of non-Hodgkin lymphoma, unspecified site: Secondary | ICD-10-CM

## 2018-02-16 NOTE — Telephone Encounter (Signed)
Gave avs and calendar ° °

## 2018-04-04 ENCOUNTER — Other Ambulatory Visit: Payer: Self-pay

## 2018-04-04 ENCOUNTER — Other Ambulatory Visit: Payer: Self-pay | Admitting: Oncology

## 2018-04-04 ENCOUNTER — Telehealth: Payer: Self-pay

## 2018-04-04 DIAGNOSIS — C8580 Other specified types of non-Hodgkin lymphoma, unspecified site: Secondary | ICD-10-CM

## 2018-04-04 NOTE — Telephone Encounter (Signed)
Pt called stating she has found a new lump in her right breast.  Order for diagnostic mammogram has been placed.  Pt aware and will call [facility] to schedule appt.

## 2018-04-06 ENCOUNTER — Other Ambulatory Visit: Payer: Self-pay | Admitting: Adult Health

## 2018-04-06 ENCOUNTER — Other Ambulatory Visit: Payer: Self-pay | Admitting: Oncology

## 2018-04-06 DIAGNOSIS — C8580 Other specified types of non-Hodgkin lymphoma, unspecified site: Secondary | ICD-10-CM

## 2018-04-06 DIAGNOSIS — N63 Unspecified lump in unspecified breast: Secondary | ICD-10-CM

## 2018-04-08 ENCOUNTER — Other Ambulatory Visit: Payer: Self-pay | Admitting: Adult Health

## 2018-04-08 ENCOUNTER — Ambulatory Visit
Admission: RE | Admit: 2018-04-08 | Discharge: 2018-04-08 | Disposition: A | Discharge status: Home / Self Care | Payer: BLUE CROSS/BLUE SHIELD | Source: Ambulatory Visit | Attending: Adult Health | Admitting: Adult Health

## 2018-04-08 DIAGNOSIS — R928 Other abnormal and inconclusive findings on diagnostic imaging of breast: Secondary | ICD-10-CM | POA: Diagnosis not present

## 2018-04-08 DIAGNOSIS — N6489 Other specified disorders of breast: Secondary | ICD-10-CM | POA: Diagnosis not present

## 2018-04-08 DIAGNOSIS — N63 Unspecified lump in unspecified breast: Secondary | ICD-10-CM

## 2018-04-08 DIAGNOSIS — C8580 Other specified types of non-Hodgkin lymphoma, unspecified site: Secondary | ICD-10-CM

## 2018-04-08 DIAGNOSIS — R599 Enlarged lymph nodes, unspecified: Secondary | ICD-10-CM

## 2018-04-12 ENCOUNTER — Other Ambulatory Visit (HOSPITAL_COMMUNITY)
Admission: RE | Admit: 2018-04-12 | Disposition: A | Discharge status: Home / Self Care | Payer: BLUE CROSS/BLUE SHIELD | Source: Ambulatory Visit | Attending: Diagnostic Radiology | Admitting: Diagnostic Radiology

## 2018-04-12 ENCOUNTER — Other Ambulatory Visit: Payer: Self-pay | Admitting: Adult Health

## 2018-04-12 ENCOUNTER — Ambulatory Visit
Admission: RE | Admit: 2018-04-12 | Discharge: 2018-04-12 | Disposition: A | Discharge status: Home / Self Care | Payer: BLUE CROSS/BLUE SHIELD | Source: Ambulatory Visit | Attending: Adult Health | Admitting: Adult Health

## 2018-04-12 DIAGNOSIS — N63 Unspecified lump in unspecified breast: Secondary | ICD-10-CM

## 2018-04-12 DIAGNOSIS — R599 Enlarged lymph nodes, unspecified: Secondary | ICD-10-CM

## 2018-04-12 DIAGNOSIS — C8590 Non-Hodgkin lymphoma, unspecified, unspecified site: Secondary | ICD-10-CM | POA: Diagnosis not present

## 2018-04-12 DIAGNOSIS — Z1211 Encounter for screening for malignant neoplasm of colon: Secondary | ICD-10-CM | POA: Diagnosis not present

## 2018-04-12 DIAGNOSIS — C8580 Other specified types of non-Hodgkin lymphoma, unspecified site: Secondary | ICD-10-CM

## 2018-04-12 DIAGNOSIS — R590 Localized enlarged lymph nodes: Secondary | ICD-10-CM | POA: Diagnosis not present

## 2018-04-12 DIAGNOSIS — C8519 Unspecified B-cell lymphoma, extranodal and solid organ sites: Secondary | ICD-10-CM | POA: Diagnosis not present

## 2018-04-18 ENCOUNTER — Encounter: Payer: Self-pay | Admitting: Oncology

## 2018-04-26 ENCOUNTER — Other Ambulatory Visit: Payer: Self-pay | Admitting: Oncology

## 2018-04-26 NOTE — Progress Notes (Signed)
I called Dana Butler and gave her verbal report of her past.  I had also sent this to her by mail.  I offered her an appointment with me with her February labs if she wishes.

## 2018-05-03 ENCOUNTER — Other Ambulatory Visit: Payer: Self-pay | Admitting: Oncology

## 2018-05-03 DIAGNOSIS — Z1231 Encounter for screening mammogram for malignant neoplasm of breast: Secondary | ICD-10-CM

## 2018-05-09 DIAGNOSIS — H2513 Age-related nuclear cataract, bilateral: Secondary | ICD-10-CM | POA: Diagnosis not present

## 2018-06-14 ENCOUNTER — Inpatient Hospital Stay: Payer: BLUE CROSS/BLUE SHIELD | Attending: Oncology

## 2018-06-14 DIAGNOSIS — C884 Extranodal marginal zone B-cell lymphoma of mucosa-associated lymphoid tissue [MALT-lymphoma]: Secondary | ICD-10-CM | POA: Diagnosis not present

## 2018-06-14 DIAGNOSIS — D649 Anemia, unspecified: Secondary | ICD-10-CM | POA: Insufficient documentation

## 2018-06-14 DIAGNOSIS — Z79899 Other long term (current) drug therapy: Secondary | ICD-10-CM | POA: Insufficient documentation

## 2018-06-14 DIAGNOSIS — C8580 Other specified types of non-Hodgkin lymphoma, unspecified site: Secondary | ICD-10-CM

## 2018-06-14 LAB — COMPREHENSIVE METABOLIC PANEL
ALT: 23 U/L (ref 0–44)
AST: 34 U/L (ref 15–41)
Albumin: 3.8 g/dL (ref 3.5–5.0)
Alkaline Phosphatase: 127 U/L — ABNORMAL HIGH (ref 38–126)
Anion gap: 8 (ref 5–15)
BUN: 13 mg/dL (ref 8–23)
CHLORIDE: 106 mmol/L (ref 98–111)
CO2: 27 mmol/L (ref 22–32)
Calcium: 9.3 mg/dL (ref 8.9–10.3)
Creatinine, Ser: 0.78 mg/dL (ref 0.44–1.00)
GFR calc Af Amer: >60 mL/min (ref >60)
GFR calc non Af Amer: >60 mL/min (ref >60)
Glucose, Bld: 91 mg/dL (ref 70–99)
Potassium: 4 mmol/L (ref 3.5–5.1)
Sodium: 141 mmol/L (ref 135–145)
Total Bilirubin: 0.6 mg/dL (ref 0.3–1.2)
Total Protein: 6.5 g/dL (ref 6.5–8.1)

## 2018-06-14 LAB — CBC WITH DIFFERENTIAL/PLATELET
Abs Immature Granulocytes: 0.05 10*3/uL (ref 0.00–0.07)
BASOS ABS: 0.1 10*3/uL (ref 0.0–0.1)
Basophils Relative: 0 %
Eosinophils Absolute: 0.3 10*3/uL (ref 0.0–0.5)
Eosinophils Relative: 1 %
HCT: 36.1 % (ref 36.0–46.0)
Hemoglobin: 11.4 g/dL — ABNORMAL LOW (ref 12.0–15.0)
Immature Granulocytes: 0 %
Lymphocytes Relative: 92 %
Lymphs Abs: 40.1 10*3/uL — ABNORMAL HIGH (ref 0.7–4.0)
MCH: 30.1 pg (ref 26.0–34.0)
MCHC: 31.6 g/dL (ref 30.0–36.0)
MCV: 95.3 fL (ref 80.0–100.0)
Monocytes Absolute: 0.5 10*3/uL (ref 0.1–1.0)
Monocytes Relative: 1 %
NEUTROS ABS: 2.4 10*3/uL (ref 1.7–7.7)
NRBC: 0 % (ref 0.0–0.2)
Neutrophils Relative %: 6 %
Platelets: 202 10*3/uL (ref 150–400)
RBC: 3.79 MIL/uL — ABNORMAL LOW (ref 3.87–5.11)
RDW: 16.6 % — ABNORMAL HIGH (ref 11.5–15.5)
WBC: 43.4 10*3/uL — ABNORMAL HIGH (ref 4.0–10.5)

## 2018-06-15 DIAGNOSIS — Z01419 Encounter for gynecological examination (general) (routine) without abnormal findings: Secondary | ICD-10-CM | POA: Diagnosis not present

## 2018-06-15 DIAGNOSIS — Z6821 Body mass index (BMI) 21.0-21.9, adult: Secondary | ICD-10-CM | POA: Diagnosis not present

## 2018-06-15 LAB — IGG, IGA, IGM
IgA: 154 mg/dL (ref 87–352)
IgG (Immunoglobin G), Serum: 885 mg/dL (ref 700–1600)
IgM (Immunoglobulin M), Srm: 35 mg/dL (ref 26–217)

## 2018-06-15 LAB — BETA 2 MICROGLOBULIN, SERUM: Beta-2 Microglobulin: 3 mg/L — ABNORMAL HIGH (ref 0.6–2.4)

## 2018-07-08 ENCOUNTER — Encounter: Payer: Self-pay | Admitting: Plastic Surgery

## 2018-07-08 ENCOUNTER — Other Ambulatory Visit: Payer: Self-pay

## 2018-07-08 ENCOUNTER — Ambulatory Visit: Payer: BLUE CROSS/BLUE SHIELD | Admitting: Plastic Surgery

## 2018-07-08 DIAGNOSIS — S31105A Unspecified open wound of abdominal wall, periumbilic region without penetration into peritoneal cavity, initial encounter: Secondary | ICD-10-CM | POA: Insufficient documentation

## 2018-07-08 NOTE — Progress Notes (Signed)
Patient ID: Dana Butler, female    DOB: 11/10/1955, 63 y.o.   MRN: [id number]   Chief Complaint  Patient presents with  . Advice Only    for abdominal mass    The patient is a 64 year old white female here for evaluation of her umbilicus.  The patient has lymphoma which is being watched but currently no active treatment.  She is a patient of Dr. Virgie Dad.  She is also a relative of Dr. Dessie Coma.  She saw Dr. Dessie Coma due to some inflammation around her umbilicus and was referred to [place].  The patient states that she underwent some pelvic surgery in 2005.  Approximately 5 years later she noted some thickening around the umbilical area.  At that time she saw Dr. Harlow Mares and had steroid injected in the area.  That seemed to help but flared up again about 2 years ago.  She took some antibiotics at that time and got it under control.  It seemed to resolve.  Then over the past week it flared up again and got very red and tender.  There was concern of infection as it was draining some yellow drainage.  She was started on Keflex a few days ago.  She states it has gotten better since starting the antibiotic.  It is tender to palpation.  The cellulitis is approximately 3 x 3 cm.  There is a central portion of the inferior part of the umbilicus that is a wound.  Its dark in color.  There is a slight bit of drainage.  She had lab work done about a month ago by Dr. Jana Hakim.  She does have left axillary and bilateral inguinal lymphadenopathy.  She states that it is stable and not different than it has been over the past several months.   Review of Systems  Constitutional: Negative.   HENT: Negative.   Eyes: Negative.   Respiratory: Negative.   Cardiovascular: Negative.   Gastrointestinal: Negative.  Negative for abdominal distention and abdominal pain.  Genitourinary: Negative.   Musculoskeletal: Negative.   Skin: Positive for color change and wound.  Hematological: Negative.    Psychiatric/Behavioral: Negative.     Past Medical History:  Diagnosis Date  . Medical history non-contributory     Past Surgical History:  Procedure Laterality Date  . ABDOMINAL HYSTERECTOMY    . AXILLARY LYMPH NODE BIOPSY Left 06/29/2014   Procedure: AXILLARY LYMPH NODE BIOPSY;  Surgeon: Armandina Gemma, MD;  Location: [facility];  Service: General;  Laterality: Left;  . COLONOSCOPY    . DIAGNOSTIC LAPAROSCOPY  2005   BSO      Current Outpatient Medications:  .  ALPRAZolam (XANAX) 0.5 MG tablet, , Disp: , Rfl:  .  calcium carbonate (OS-CAL) 1250 (500 CA) MG chewable tablet, Chew by mouth., Disp: , Rfl:  .  Cholecalciferol (VITAMIN D3) 5000 UNITS TABS, Take by mouth., Disp: , Rfl:  .  estradiol (VIVELLE-DOT) 0.05 MG/24HR patch, Place 1 patch onto the skin 2 (two) times a week., Disp: , Rfl:  .  Fish Oil OIL, Take by mouth., Disp: , Rfl:  .  Glucosamine-Chondroitin (GLUCOSAMINE CHONDR COMPLEX PO), Take by mouth., Disp: , Rfl:  .  ibuprofen (ADVIL,MOTRIN) 200 MG tablet, Take 200 mg by mouth., Disp: , Rfl:  .  Multiple Vitamins-Minerals (MULTIVITAMIN WITH MINERALS) tablet, Take 1 tablet by mouth daily., Disp: , Rfl:  .  Zaleplon (SONATA PO), Take by mouth., Disp: , Rfl:  .  cephALEXin (KEFLEX)  250 MG capsule, Take 1 capsule by mouth daily., Disp: , Rfl:  .  glucosamine-chondroitin 500-400 MG tablet, Take by mouth., Disp: , Rfl:  .  zaleplon (SONATA) 10 MG capsule, Take 10 mg by mouth at bedtime., Disp: , Rfl:    Objective:   Vitals:   07/08/18 0948  BP: 115/66  Pulse: 67  Temp: 97.9 F (36.6 C)  SpO2: 93%    Physical Exam Vitals signs and nursing note reviewed.  Constitutional:      Appearance: Normal appearance.  HENT:     Head: Normocephalic and atraumatic.     Nose: Nose normal.     Mouth/Throat:     Mouth: Mucous membranes are moist.  Cardiovascular:     Rate and Rhythm: Normal rate.  Pulmonary:     Effort: Pulmonary effort is normal.  Abdominal:      General: Abdomen is flat. There is no distension.     Palpations: There is no mass.     Tenderness: There is abdominal tenderness. There is no rebound.     Hernia: No hernia is present.  Neurological:     General: No focal deficit present.     Mental Status: She is alert.  Psychiatric:        Mood and Affect: Mood normal.        Thought Content: Thought content normal.        Judgment: Judgment normal.     Assessment & Plan:  Open wound of umbilical region, initial encounter  I am concerned about a stitch irritation and infection at the umbilical area.  We agree that it is time to explore, debride, possibly remove the stitch if present and likely place ACell for wound healing.  [place], DO

## 2018-07-11 ENCOUNTER — Other Ambulatory Visit: Payer: Self-pay | Admitting: Oncology

## 2018-07-11 ENCOUNTER — Other Ambulatory Visit: Payer: Self-pay

## 2018-07-11 ENCOUNTER — Ambulatory Visit
Admission: RE | Admit: 2018-07-11 | Discharge: 2018-07-11 | Disposition: A | Discharge status: Home / Self Care | Payer: BLUE CROSS/BLUE SHIELD | Source: Ambulatory Visit | Attending: Oncology | Admitting: Oncology

## 2018-07-11 DIAGNOSIS — Z1231 Encounter for screening mammogram for malignant neoplasm of breast: Secondary | ICD-10-CM | POA: Diagnosis not present

## 2018-07-11 NOTE — Progress Notes (Unsigned)
The patient saw Dr. Tedra Coupe him regarding a periumbilical infection which Dr. Elisabeth Cara believes is due to stitch abscess.  I do not see any contraindication to removing that.

## 2018-07-12 ENCOUNTER — Ambulatory Visit: Payer: BLUE CROSS/BLUE SHIELD

## 2018-07-13 ENCOUNTER — Other Ambulatory Visit: Payer: Self-pay | Admitting: Oncology

## 2018-07-13 DIAGNOSIS — R928 Other abnormal and inconclusive findings on diagnostic imaging of breast: Secondary | ICD-10-CM

## 2018-07-18 ENCOUNTER — Ambulatory Visit: Admission: RE | Admit: 2018-07-18 | Payer: BLUE CROSS/BLUE SHIELD | Source: Ambulatory Visit

## 2018-07-18 ENCOUNTER — Other Ambulatory Visit: Payer: Self-pay

## 2018-07-18 ENCOUNTER — Ambulatory Visit
Admission: RE | Admit: 2018-07-18 | Discharge: 2018-07-18 | Disposition: A | Discharge status: Home / Self Care | Payer: BLUE CROSS/BLUE SHIELD | Source: Ambulatory Visit | Attending: Oncology | Admitting: Oncology

## 2018-07-18 DIAGNOSIS — R928 Other abnormal and inconclusive findings on diagnostic imaging of breast: Secondary | ICD-10-CM | POA: Diagnosis not present

## 2018-07-20 ENCOUNTER — Telehealth: Payer: Self-pay | Admitting: Plastic Surgery

## 2018-07-20 NOTE — Telephone Encounter (Signed)
Patient would like to hold off on surgery. Her husband is a Engineer, drilling (retired) and says the wound looks better but due to her health history (lymphoma), he doesn't want to put her in the hospital setting if the surgery can be delayed. They will call back once the corona threat has passed or if the area of concern worsens.

## 2018-07-29 ENCOUNTER — Encounter: Payer: BLUE CROSS/BLUE SHIELD | Admitting: Plastic Surgery

## 2018-08-03 ENCOUNTER — Encounter (HOSPITAL_BASED_OUTPATIENT_CLINIC_OR_DEPARTMENT_OTHER): Admission: RE | Payer: Self-pay | Source: Home / Self Care

## 2018-08-03 ENCOUNTER — Ambulatory Visit (HOSPITAL_BASED_OUTPATIENT_CLINIC_OR_DEPARTMENT_OTHER)
Admission: RE | Admit: 2018-08-03 | Payer: BLUE CROSS/BLUE SHIELD | Source: Home / Self Care | Admitting: Plastic Surgery

## 2018-08-03 SURGERY — EXCISION, MASS, TORSO
Anesthesia: General | Site: Abdomen

## 2018-08-12 ENCOUNTER — Encounter: Payer: BLUE CROSS/BLUE SHIELD | Admitting: Plastic Surgery

## 2018-10-10 ENCOUNTER — Other Ambulatory Visit: Payer: Self-pay | Admitting: Oncology

## 2018-10-10 DIAGNOSIS — C8580 Other specified types of non-Hodgkin lymphoma, unspecified site: Secondary | ICD-10-CM

## 2018-10-11 ENCOUNTER — Inpatient Hospital Stay: Payer: BC Managed Care – PPO | Attending: Oncology

## 2018-10-11 ENCOUNTER — Other Ambulatory Visit: Payer: Self-pay

## 2018-10-11 DIAGNOSIS — Z803 Family history of malignant neoplasm of breast: Secondary | ICD-10-CM | POA: Insufficient documentation

## 2018-10-11 DIAGNOSIS — C8514 Unspecified B-cell lymphoma, lymph nodes of axilla and upper limb: Secondary | ICD-10-CM | POA: Diagnosis not present

## 2018-10-11 DIAGNOSIS — C8580 Other specified types of non-Hodgkin lymphoma, unspecified site: Secondary | ICD-10-CM

## 2018-10-11 DIAGNOSIS — Z79899 Other long term (current) drug therapy: Secondary | ICD-10-CM | POA: Insufficient documentation

## 2018-10-11 LAB — COMPREHENSIVE METABOLIC PANEL
ALT: 25 U/L (ref 0–44)
AST: 31 U/L (ref 15–41)
Albumin: 3.9 g/dL (ref 3.5–5.0)
Alkaline Phosphatase: 145 U/L — ABNORMAL HIGH (ref 38–126)
Anion gap: 9 (ref 5–15)
BUN: 14 mg/dL (ref 8–23)
CO2: 25 mmol/L (ref 22–32)
Calcium: 9.4 mg/dL (ref 8.9–10.3)
Chloride: 107 mmol/L (ref 98–111)
Creatinine, Ser: 0.76 mg/dL (ref 0.44–1.00)
GFR calc Af Amer: >60 mL/min (ref >60)
GFR calc non Af Amer: >60 mL/min (ref >60)
Glucose, Bld: 95 mg/dL (ref 70–99)
Potassium: 4.2 mmol/L (ref 3.5–5.1)
Sodium: 141 mmol/L (ref 135–145)
Total Bilirubin: 0.5 mg/dL (ref 0.3–1.2)
Total Protein: 6.6 g/dL (ref 6.5–8.1)

## 2018-10-11 LAB — CBC WITH DIFFERENTIAL/PLATELET
Abs Immature Granulocytes: 0.07 10*3/uL (ref 0.00–0.07)
Basophils Absolute: 0.1 10*3/uL (ref 0.0–0.1)
Basophils Relative: 0 %
Eosinophils Absolute: 0.5 10*3/uL (ref 0.0–0.5)
Eosinophils Relative: 1 %
HCT: 37.4 % (ref 36.0–46.0)
Hemoglobin: 11.5 g/dL — ABNORMAL LOW (ref 12.0–15.0)
Immature Granulocytes: 0 %
Lymphocytes Relative: 92 %
Lymphs Abs: 43.4 10*3/uL — ABNORMAL HIGH (ref 0.7–4.0)
MCH: 29.9 pg (ref 26.0–34.0)
MCHC: 30.7 g/dL (ref 30.0–36.0)
MCV: 97.1 fL (ref 80.0–100.0)
Monocytes Absolute: 0.3 10*3/uL (ref 0.1–1.0)
Monocytes Relative: 1 %
Neutro Abs: 3 10*3/uL (ref 1.7–7.7)
Neutrophils Relative %: 6 %
Platelets: 241 10*3/uL (ref 150–400)
RBC: 3.85 MIL/uL — ABNORMAL LOW (ref 3.87–5.11)
RDW: 17 % — ABNORMAL HIGH (ref 11.5–15.5)
WBC: 47.3 10*3/uL — ABNORMAL HIGH (ref 4.0–10.5)
nRBC: 0 % (ref 0.0–0.2)

## 2018-10-12 LAB — BETA 2 MICROGLOBULIN, SERUM: Beta-2 Microglobulin: 3.2 mg/L — ABNORMAL HIGH (ref 0.6–2.4)

## 2018-10-12 LAB — IGG, IGA, IGM
IgA: 155 mg/dL (ref 87–352)
IgG (Immunoglobin G), Serum: 916 mg/dL (ref 586–1602)
IgM (Immunoglobulin M), Srm: 41 mg/dL (ref 26–217)

## 2019-01-16 ENCOUNTER — Other Ambulatory Visit: Payer: Self-pay | Admitting: Oncology

## 2019-01-26 ENCOUNTER — Telehealth: Payer: Self-pay | Admitting: Oncology

## 2019-01-26 NOTE — Telephone Encounter (Signed)
GM PAL 10/29 moved f/u from 10/29 to 10/15. Other appointments remain the same. Left message schedule mailed.

## 2019-02-02 ENCOUNTER — Telehealth: Payer: Self-pay

## 2019-02-02 NOTE — Telephone Encounter (Signed)
-----   Message from [place] sent at 01/20/2019 12:10 PM EDT ----- Hello! This patient left a voicemail saying she wanted to discuss surgery again. She had postponed due to Covid threat.

## 2019-02-02 NOTE — Telephone Encounter (Signed)
Called and left vm for pt to call back.  

## 2019-02-06 ENCOUNTER — Ambulatory Visit (INDEPENDENT_AMBULATORY_CARE_PROVIDER_SITE_OTHER): Payer: BC Managed Care – PPO | Admitting: Nurse Practitioner

## 2019-02-06 ENCOUNTER — Encounter: Payer: Self-pay | Admitting: Nurse Practitioner

## 2019-02-06 ENCOUNTER — Other Ambulatory Visit: Payer: Self-pay

## 2019-02-06 VITALS — BP 115/71 | HR 68 | Temp 97.3°F | Ht 62.0 in | Wt 118.8 lb

## 2019-02-06 DIAGNOSIS — S31105A Unspecified open wound of abdominal wall, periumbilic region without penetration into peritoneal cavity, initial encounter: Secondary | ICD-10-CM | POA: Diagnosis not present

## 2019-02-06 MED ORDER — CEPHALEXIN 500 MG PO CAPS: 500.0000 mg | ORAL_CAPSULE | Freq: Four times a day (QID) | ORAL | 0 refills | Status: AC

## 2019-02-06 MED ORDER — HYDROCODONE-ACETAMINOPHEN 5-325 MG PO TABS: 1.0000 | ORAL_TABLET | Freq: Four times a day (QID) | ORAL | 0 refills | Status: AC | PRN

## 2019-02-06 NOTE — Progress Notes (Addendum)
History of Present Illness: Dana Butler is a 63 y.o.  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 Patient underwent an abdominal hysterectomy in 2005, where an incision was made in her umbilicus. She intermittently experiences tenderness and wound development at her umbilicus. Patient states she has received steroid injections to the site in the past. Patient states the last flare up at the site was in March 2020. Patient states there was "a lot of pus" and received a course of Keflex. Patient was evaluated by Dr. Marla Roe shortly after completing the antibiotic. Patient decided to hold off on surgery due to the COVID-19 pandemic. Patient denies any recent flare ups, but is ready to move forward with surgery. Patient states the area is always discolored and red.  She presents for preoperative evaluation for upcoming procedure, excision of umbilical wound and possible foreign body removal with Acell or Integra, scheduled for 02/22/19 with Dr. Marla Roe.  Past medical history is significant for Marginal zone B-cell lymphoma. Patient is not currently receiving any treatment for lymphoma and is following a "watch and wait" treatment plan. Allergies to latex and tape.   Patient denies tobacco use. Patient denies any personal or family history of blood clots. Patient denies any adverse reactions to anesthesia.  Patient states she will be out of town on her currently scheduled COVID-19 date. She requests to be rescheduled for Monday 10/26. Patient's husband will be assisting her on the day of surgery.    Past Medical History: Allergies: Allergies  Allergen Reactions  . Latex Rash  . Tape Rash    Current Medications:  Current Outpatient Medications:  .  ALPRAZolam (XANAX) 0.5 MG tablet, , Disp: , Rfl:  .  calcium carbonate (OS-CAL) 1250 (500 CA) MG chewable tablet, Chew by mouth., Disp: , Rfl:  .  Cholecalciferol (VITAMIN D3)  5000 UNITS TABS, Take by mouth., Disp: , Rfl:  .  estradiol (VIVELLE-DOT) 0.05 MG/24HR patch, Place 1 patch onto the skin 2 (two) times a week., Disp: , Rfl:  .  Fish Oil OIL, Take by mouth., Disp: , Rfl:  .  glucosamine-chondroitin 500-400 MG tablet, Take by mouth., Disp: , Rfl:  .  ibuprofen (ADVIL,MOTRIN) 200 MG tablet, Take 200 mg by mouth., Disp: , Rfl:  .  Multiple Vitamins-Minerals (MULTIVITAMIN WITH MINERALS) tablet, Take 1 tablet by mouth daily., Disp: , Rfl:  .  OMEGA-3 FATTY ACIDS-VITAMIN E PO, 1,000 capsules. Take 2 capsules by mouth daily., Disp: , Rfl:  .  zaleplon (SONATA) 10 MG capsule, Take 10 mg by mouth at bedtime., Disp: , Rfl:   Past Medical Problems: Past Medical History:  Diagnosis Date  . Medical history non-contributory     Past Surgical History: Past Surgical History:  Procedure Laterality Date  . ABDOMINAL HYSTERECTOMY    . AXILLARY LYMPH NODE BIOPSY Left 06/29/2014   Procedure: AXILLARY LYMPH NODE BIOPSY;  Surgeon: Armandina Gemma, MD;  Location: [facility];  Service: General;  Laterality: Left;  . COLONOSCOPY    . DIAGNOSTIC LAPAROSCOPY  2005   BSO    The patient has had anesthesia or sedation in the past.   The patient has NOT had problems with anesthesia.  The patient does NOT have a family history of anesthesia problems.   Social History: Social History   Socioeconomic History  . Marital status: Married    Spouse name: Not on file  . Number of children: Not on file  . Years  of education: Not on file  . Highest education level: Not on file  Occupational History  . Not on file  Social Needs  . Financial resource strain: Not on file  . Food insecurity    Worry: Not on file    Inability: Not on file  . Transportation needs    Medical: Not on file    Non-medical: Not on file  Tobacco Use  . Smoking status: Never Smoker  . Smokeless tobacco: Never Used  Substance and Sexual Activity  . Alcohol use: No  . Drug use: No  . Sexual  activity: Not on file  Lifestyle  . Physical activity    Days per week: Not on file    Minutes per session: Not on file  . Stress: Not on file  Relationships  . Social Herbalist on phone: Not on file    Gets together: Not on file    Attends religious service: Not on file    Active member of club or organization: Not on file    Attends meetings of clubs or organizations: Not on file    Relationship status: Not on file  . Intimate partner violence    Fear of current or ex partner: Not on file    Emotionally abused: Not on file    Physically abused: Not on file    Forced sexual activity: Not on file  Other Topics Concern  . Not on file  Social History Narrative  . Not on file    Family History: Family History  Problem Relation Age of Onset  . Breast cancer Paternal Aunt     Review of Systems: General ROS: enlarged lymph nodes Dermatological ROS: negative Cardiovascular ROS: no chest pain or dyspnea on exertion ENT ROS: negative Gastrointestinal ROS: no abdominal pain, change in bowel habits, or black or bloody stools  Physical Exam: Vital Signs BP 115/71 (BP Location: Left Arm, Patient Position: Sitting, Cuff Size: Normal)   Pulse 68   Temp (!) 97.3 F (36.3 C) (Temporal)   Ht 5\' 2"  (1.575 m)   Wt 118 lb 12.8 oz (53.9 kg)   SpO2 97%   BMI 21.73 kg/m  General: awake, alert, appears stated age, no acute distress HEENT: teeth intact Neck: supple, full ROM, enlarged cervical lymph nodes bilaterally, left supraclavicular lymph nodes palpated Chest: symmetrical rise and fall Cardiac: regular rate and rhythm, +2 radial pulses bilaterally, +2 pedal pulses bilaterally Abdomen: soft, non-distended, non-tender, 1.5 cm x 2 cm area of red discoloration at umbilicus, non-tender Musculoskeletal: MAE x4 Neuro: A&Ox3 Skin: no skin lesions or abnormalities  Assessment: 63 y.o.female  with a history of Marginal B-cell lymphoma and intermittent umbilical wound at  previous surgery incision site. No current infection at umbilicus.   Caprini score=6, patient will require mechanical prophylaxis during surgery and possible pharmacological prophylaxis on day of surgery.  Plan: Patient scheduled for excision of umbilical wound and possible foreign body removal with Acell or Integra,  with Dr. Marla Roe on 02/22/19.  Risks, benefits, and alternatives of procedure discussed, expected recovery, and questions answered. Discussed importance of increased protein and decreased carb and sugar intake for wound healing.   COVID-19 pre-procedure test re-scheduled for 02/20/19 per patient request. Patient understands she must arrive by 0830. Patent understands surgery will be postponed if COVID-19 results are not posted by surgery date . Post-op visit scheduled for 03/03/19. Post-op medications ordered.    Electronically signed by: Alfredo Batty, NP 02/06/2019 8:54 AM

## 2019-02-06 NOTE — H&P (View-Only) (Signed)
History of Present Illness: Dana Butler is a 63 y.o.  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 Patient underwent an abdominal hysterectomy in 2005, where an incision was made in her umbilicus. She intermittently experiences tenderness and wound development at her umbilicus. Patient states she has received steroid injections to the site in the past. Patient states the last flare up at the site was in March 2020. Patient states there was "a lot of pus" and received a course of Keflex. Patient was evaluated by Dr. Marla Roe shortly after completing the antibiotic. Patient decided to hold off on surgery due to the COVID-19 pandemic. Patient denies any recent flare ups, but is ready to move forward with surgery. Patient states the area is always discolored and red.  She presents for preoperative evaluation for upcoming procedure, excision of umbilical wound and possible foreign body removal with Acell or Integra, scheduled for 02/22/19 with Dr. Marla Roe.  Past medical history is significant for Marginal zone B-cell lymphoma. Patient is not currently receiving any treatment for lymphoma and is following a "watch and wait" treatment plan. Allergies to latex and tape.   Patient denies tobacco use. Patient denies any personal or family history of blood clots. Patient denies any adverse reactions to anesthesia.  Patient states she will be out of town on her currently scheduled COVID-19 date. She requests to be rescheduled for Monday 10/26. Patient's husband will be assisting her on the day of surgery.    Past Medical History: Allergies: Allergies  Allergen Reactions  . Latex Rash  . Tape Rash    Current Medications:  Current Outpatient Medications:  .  ALPRAZolam (XANAX) 0.5 MG tablet, , Disp: , Rfl:  .  calcium carbonate (OS-CAL) 1250 (500 CA) MG chewable tablet, Chew by mouth., Disp: , Rfl:  .  Cholecalciferol (VITAMIN D3)  5000 UNITS TABS, Take by mouth., Disp: , Rfl:  .  estradiol (VIVELLE-DOT) 0.05 MG/24HR patch, Place 1 patch onto the skin 2 (two) times a week., Disp: , Rfl:  .  Fish Oil OIL, Take by mouth., Disp: , Rfl:  .  glucosamine-chondroitin 500-400 MG tablet, Take by mouth., Disp: , Rfl:  .  ibuprofen (ADVIL,MOTRIN) 200 MG tablet, Take 200 mg by mouth., Disp: , Rfl:  .  Multiple Vitamins-Minerals (MULTIVITAMIN WITH MINERALS) tablet, Take 1 tablet by mouth daily., Disp: , Rfl:  .  OMEGA-3 FATTY ACIDS-VITAMIN E PO, 1,000 capsules. Take 2 capsules by mouth daily., Disp: , Rfl:  .  zaleplon (SONATA) 10 MG capsule, Take 10 mg by mouth at bedtime., Disp: , Rfl:   Past Medical Problems: Past Medical History:  Diagnosis Date  . Medical history non-contributory     Past Surgical History: Past Surgical History:  Procedure Laterality Date  . ABDOMINAL HYSTERECTOMY    . AXILLARY LYMPH NODE BIOPSY Left 06/29/2014   Procedure: AXILLARY LYMPH NODE BIOPSY;  Surgeon: Armandina Gemma, MD;  Location: [facility];  Service: General;  Laterality: Left;  . COLONOSCOPY    . DIAGNOSTIC LAPAROSCOPY  2005   BSO    The patient has had anesthesia or sedation in the past.   The patient has NOT had problems with anesthesia.  The patient does NOT have a family history of anesthesia problems.   Social History: Social History   Socioeconomic History  . Marital status: Married    Spouse name: Not on file  . Number of children: Not on file  . Years  of education: Not on file  . Highest education level: Not on file  Occupational History  . Not on file  Social Needs  . Financial resource strain: Not on file  . Food insecurity    Worry: Not on file    Inability: Not on file  . Transportation needs    Medical: Not on file    Non-medical: Not on file  Tobacco Use  . Smoking status: Never Smoker  . Smokeless tobacco: Never Used  Substance and Sexual Activity  . Alcohol use: No  . Drug use: No  . Sexual  activity: Not on file  Lifestyle  . Physical activity    Days per week: Not on file    Minutes per session: Not on file  . Stress: Not on file  Relationships  . Social Herbalist on phone: Not on file    Gets together: Not on file    Attends religious service: Not on file    Active member of club or organization: Not on file    Attends meetings of clubs or organizations: Not on file    Relationship status: Not on file  . Intimate partner violence    Fear of current or ex partner: Not on file    Emotionally abused: Not on file    Physically abused: Not on file    Forced sexual activity: Not on file  Other Topics Concern  . Not on file  Social History Narrative  . Not on file    Family History: Family History  Problem Relation Age of Onset  . Breast cancer Paternal Aunt     Review of Systems: General ROS: enlarged lymph nodes Dermatological ROS: negative Cardiovascular ROS: no chest pain or dyspnea on exertion ENT ROS: negative Gastrointestinal ROS: no abdominal pain, change in bowel habits, or black or bloody stools  Physical Exam: Vital Signs BP 115/71 (BP Location: Left Arm, Patient Position: Sitting, Cuff Size: Normal)   Pulse 68   Temp (!) 97.3 F (36.3 C) (Temporal)   Ht 5\' 2"  (1.575 m)   Wt 118 lb 12.8 oz (53.9 kg)   SpO2 97%   BMI 21.73 kg/m  General: awake, alert, appears stated age, no acute distress HEENT: teeth intact Neck: supple, full ROM, enlarged cervical lymph nodes bilaterally, left supraclavicular lymph nodes palpated Chest: symmetrical rise and fall Cardiac: regular rate and rhythm, +2 radial pulses bilaterally, +2 pedal pulses bilaterally Abdomen: soft, non-distended, non-tender, 1.5 cm x 2 cm area of red discoloration at umbilicus, non-tender Musculoskeletal: MAE x4 Neuro: A&Ox3 Skin: no skin lesions or abnormalities  Assessment: 63 y.o.female  with a history of Marginal B-cell lymphoma and intermittent umbilical wound at  previous surgery incision site. No current infection at umbilicus.   Caprini score=6, patient will require mechanical prophylaxis during surgery and possible pharmacological prophylaxis on day of surgery.  Plan: Patient scheduled for excision of umbilical wound and possible foreign body removal with Acell or Integra,  with Dr. Marla Roe on 02/22/19.  Risks, benefits, and alternatives of procedure discussed, expected recovery, and questions answered. Discussed importance of increased protein and decreased carb and sugar intake for wound healing.   COVID-19 pre-procedure test re-scheduled for 02/20/19 per patient request. Patient understands she must arrive by 0830. Patent understands surgery will be postponed if COVID-19 results are not posted by surgery date . Post-op visit scheduled for 03/03/19. Post-op medications ordered.    Electronically signed by: Alfredo Batty, NP 02/06/2019 8:54 AM

## 2019-02-06 NOTE — Addendum Note (Signed)
Addended by: Cliffton Asters on: 02/06/2019 10:07 AM   Modules accepted: Orders

## 2019-02-07 ENCOUNTER — Inpatient Hospital Stay: Payer: BC Managed Care – PPO | Attending: Oncology

## 2019-02-07 ENCOUNTER — Other Ambulatory Visit: Payer: Self-pay

## 2019-02-07 DIAGNOSIS — M542 Cervicalgia: Secondary | ICD-10-CM | POA: Insufficient documentation

## 2019-02-07 DIAGNOSIS — C8580 Other specified types of non-Hodgkin lymphoma, unspecified site: Secondary | ICD-10-CM

## 2019-02-07 DIAGNOSIS — Z79899 Other long term (current) drug therapy: Secondary | ICD-10-CM | POA: Diagnosis not present

## 2019-02-07 DIAGNOSIS — Z791 Long term (current) use of non-steroidal anti-inflammatories (NSAID): Secondary | ICD-10-CM | POA: Insufficient documentation

## 2019-02-07 DIAGNOSIS — D649 Anemia, unspecified: Secondary | ICD-10-CM | POA: Diagnosis not present

## 2019-02-07 DIAGNOSIS — Z8572 Personal history of non-Hodgkin lymphomas: Secondary | ICD-10-CM | POA: Diagnosis not present

## 2019-02-07 LAB — CBC WITH DIFFERENTIAL/PLATELET
Abs Immature Granulocytes: 0.07 10*3/uL (ref 0.00–0.07)
Basophils Absolute: 0.1 10*3/uL (ref 0.0–0.1)
Basophils Relative: 0 %
Eosinophils Absolute: 0.5 10*3/uL (ref 0.0–0.5)
Eosinophils Relative: 1 %
HCT: 36.1 % (ref 36.0–46.0)
Hemoglobin: 11.6 g/dL — ABNORMAL LOW (ref 12.0–15.0)
Immature Granulocytes: 0 %
Lymphocytes Relative: 92 %
Lymphs Abs: 41.4 10*3/uL — ABNORMAL HIGH (ref 0.7–4.0)
MCH: 30.4 pg (ref 26.0–34.0)
MCHC: 32.1 g/dL (ref 30.0–36.0)
MCV: 94.8 fL (ref 80.0–100.0)
Monocytes Absolute: 0.4 10*3/uL (ref 0.1–1.0)
Monocytes Relative: 1 %
Neutro Abs: 2.7 10*3/uL (ref 1.7–7.7)
Neutrophils Relative %: 6 %
Platelets: 233 10*3/uL (ref 150–400)
RBC: 3.81 MIL/uL — ABNORMAL LOW (ref 3.87–5.11)
RDW: 16.7 % — ABNORMAL HIGH (ref 11.5–15.5)
WBC: 45.1 10*3/uL — ABNORMAL HIGH (ref 4.0–10.5)
nRBC: 0 % (ref 0.0–0.2)

## 2019-02-07 LAB — COMPREHENSIVE METABOLIC PANEL
ALT: 21 U/L (ref 0–44)
AST: 28 U/L (ref 15–41)
Albumin: 3.7 g/dL (ref 3.5–5.0)
Alkaline Phosphatase: 180 U/L — ABNORMAL HIGH (ref 38–126)
Anion gap: 9 (ref 5–15)
BUN: 16 mg/dL (ref 8–23)
CO2: 25 mmol/L (ref 22–32)
Calcium: 8.7 mg/dL — ABNORMAL LOW (ref 8.9–10.3)
Chloride: 108 mmol/L (ref 98–111)
Creatinine, Ser: 0.74 mg/dL (ref 0.44–1.00)
GFR calc Af Amer: >60 mL/min (ref >60)
GFR calc non Af Amer: >60 mL/min (ref >60)
Glucose, Bld: 101 mg/dL — ABNORMAL HIGH (ref 70–99)
Potassium: 4.2 mmol/L (ref 3.5–5.1)
Sodium: 142 mmol/L (ref 135–145)
Total Bilirubin: 0.4 mg/dL (ref 0.3–1.2)
Total Protein: 6.5 g/dL (ref 6.5–8.1)

## 2019-02-08 LAB — IGG, IGA, IGM
IgA: 150 mg/dL (ref 87–352)
IgG (Immunoglobin G), Serum: 1009 mg/dL (ref 586–1602)
IgM (Immunoglobulin M), Srm: 35 mg/dL (ref 26–217)

## 2019-02-08 LAB — BETA 2 MICROGLOBULIN, SERUM: Beta-2 Microglobulin: 3 mg/L — ABNORMAL HIGH (ref 0.6–2.4)

## 2019-02-09 ENCOUNTER — Inpatient Hospital Stay (HOSPITAL_BASED_OUTPATIENT_CLINIC_OR_DEPARTMENT_OTHER): Payer: BC Managed Care – PPO | Admitting: Oncology

## 2019-02-09 ENCOUNTER — Other Ambulatory Visit: Payer: Self-pay

## 2019-02-09 VITALS — BP 132/69 | HR 70 | Temp 98.7°F | Resp 18 | Ht 62.0 in | Wt 116.6 lb

## 2019-02-09 DIAGNOSIS — Z8572 Personal history of non-Hodgkin lymphomas: Secondary | ICD-10-CM | POA: Diagnosis not present

## 2019-02-09 DIAGNOSIS — Z791 Long term (current) use of non-steroidal anti-inflammatories (NSAID): Secondary | ICD-10-CM | POA: Diagnosis not present

## 2019-02-09 DIAGNOSIS — C8580 Other specified types of non-Hodgkin lymphoma, unspecified site: Secondary | ICD-10-CM | POA: Diagnosis not present

## 2019-02-09 DIAGNOSIS — D649 Anemia, unspecified: Secondary | ICD-10-CM | POA: Diagnosis not present

## 2019-02-09 DIAGNOSIS — Z79899 Other long term (current) drug therapy: Secondary | ICD-10-CM | POA: Diagnosis not present

## 2019-02-09 DIAGNOSIS — M8580 Other specified disorders of bone density and structure, unspecified site: Secondary | ICD-10-CM

## 2019-02-09 DIAGNOSIS — M542 Cervicalgia: Secondary | ICD-10-CM | POA: Diagnosis not present

## 2019-02-09 NOTE — Progress Notes (Signed)
[facility]  Telephone:([phone] Fax:([phone]) [phone]     ID: Lunden Stieber Prout DOB: Aug 15, 1955  MR#: [id number]  WUG#:[id number]  Patient Care Team: Cari Caraway, MD as PCP - General (Family Medicine) Jodi Marble, MD as Consulting Physician (Otolaryngology) Armandina Gemma, MD as Consulting Physician (General Surgery) Kennith Center, RD as Dietitian (Family Medicine) PCP: Cari Caraway, MD OTHER MD: Barrie Dunker MD  CHIEF COMPLAINT: nodal marginal zone non-Hodgkin's lymphoma  CURRENT TREATMENT: Observation   HISTORY OF PRESENT ILNESS: From the 07/20/2014 intake note:  Tasheba had some "shooting pains across her neck" earliy in 2016, and saw ENT regarding this. There was a question of mild cervical adenopathy noted at that time. More recently, she underwent screening mammography 05/30/2014 at the [facility]. This suggested enlarged lymph nodes in the left axilla and the patient was recalled for left axillary ultrasound 06/08/2014. There were 2 enlarged lymph nodes, one measuring 2.4 cm, retaining its fatty hilum, but with the cortex measuring 4.5 mm. Posterior to that there was a second lymph node measuring 3.1 cm, with no internal fat. There was no mass noted in the upper outer quadrant of the left breast and review of the earlier mammogram showed no mammographic abnormality in either breast.  Biopsy of one of the left axillary lymph nodes 06/19/2014 showed ([id number]) a relatively monomorphic population of small round to slightly irregular lymphocytes, with dense chromatin and inconspicuous nucleoli. Flow cytometry ([id number]) showed a lambda restricted B-cell population expressing CD20, but not CD5, CD10 or cycling D1. Ki-67 was less than 10%. A preliminary diagnosis of B-cell non-Hodgkin's lymphoma, low-grade, was made..  On 06/29/2014 the patient underwent left axillary excisional biopsy with a total of 4 lymph nodes removed. The pathology from this more  extensive sample showed ([id number]) effacement of the lymph node architecture by the lymphoma cells previously described, with only scattered larger lymphoid cells and histiocytes and no significant plasma cell component. There were scattered mitotic figures and no necrosis. Repeat flow cytometry showed most of the lymphocytes were CD20, CD23 and CD79a positive, with coexpression of CD43 but not CD5, CD10, BCL-6 or cyclin D1. The cells were BCL-2 positive. Overall it was pathology's impression that this was a low-grade B-cell non-Hodgkin's lymphoma, marginal zone type.  On 07/19/2014 Marissia underwent PET scanning which showed bilateral cervical adenopathy, bilateral axillary adenopathy, some small left common iliac lymph nodes and a normal spleen. There was very low metabolic activity in all the lymph nodes noted and similarly in the pelvic marrow suggesting possible marrow involvement.  The patient's subsequent history is as detailed below   INTERVAL HISTORY: Keyia returns today for follow-up and treatment of her B cell non-Hodgkin's nodal marginal zone lymphoma. She was last seen here on 02/16/2018.   She continues under observation.  She denies any drenching sweats unexplained weight loss unexplained fatigue, rash, pruritus, unexplained fever, or any significant change in her adenopathy  Since her last visit here, she underwent a digital diagnostic right mammogram with tomography and a right breast ultrasound on 04/08/2018 showing: breast Density Category B. There are no discrete masses, areas of architectural distortion, areas of significant asymmetry or suspicious calcifications. No mammographic correlate to the palpable abnormality. No mammographic change. On physical exam, there is a superficial smooth, firm mass along the far lateral margin of the right breast. Targeted ultrasound is performed, showing a lymph node in the far lateral right breast at 8 o'clock, 7 cm the nipple, corresponding to  the palpable  abnormality. This lies just below the dermis. It measures 12 x 6 x 10 mm. It has a maximal cortical thickness of 3.3 mm. There is a strip of hilar fat.  She then underwent a right breast biopsy on 04/12/2018. The pathology from this procedure showed [id number]): Breast, right, needle core biopsy, 8 o'clock, lateral chest wall - non-hodgkin b-cell lymphoma most consistent with extranodal marginal zone lymphoma   - see comment: the core biopsy is composed of a monotonous population of small to medium size lymphocytes. By immunohistochemistry, the lymphocytes are positive for  d20, cd43, and cd79a but negative for cd3, cd5, cd10, bcl-6, cd21, and cyclin-d1. Flow  cytometry performed on the sample (see [id number]) identified a lambda restricted b-cell population comprising 89% of all lymphocytes. The morphology and immunophenotype are compatible with the patient's previously diagnosed marginal zone lymphoma.   Cytology was performed on the same day. The pathology from this procedure showed ([id number]): Tissue-flow cytometry   - lambda-restricted b-cell population comprises 89% of all lymphocytes  She underwent a digital screening bilateral mammogram with tomography on 07/11/2018 showing: Breast Density Category B. In the left breast, a possible asymmetry warrants further evaluation. In the right breast, no findings suspicious for malignancy. Images were processed with CAD.  She then underwent a digital diagnostic unilateral left mammogram with tomography on 03/823/2020 showing: Breast Density Category C. There is no mammographic evidence for malignancy.    REVIEW OF SYSTEMS: Sherese is going to have a stitch removed from her bellybutton area by Dr. Marla Roe later this month.  She will be tested for the COVID prior to that procedure as per protocol.  She and her husband are keeping appropriate pandemic precautions.  Detailed review of systems today was otherwise  noncontributory    PAST MEDICAL HISTORY: Past Medical History:  Diagnosis Date  . Medical history non-contributory     PAST SURGICAL HISTORY: Past Surgical History:  Procedure Laterality Date  . ABDOMINAL HYSTERECTOMY    . AXILLARY LYMPH NODE BIOPSY Left 06/29/2014   Procedure: AXILLARY LYMPH NODE BIOPSY;  Surgeon: Armandina Gemma, MD;  Location: [facility];  Service: General;  Laterality: Left;  . COLONOSCOPY    . DIAGNOSTIC LAPAROSCOPY  2005   BSO    FAMILY HISTORY Family History  Problem Relation Age of Onset  . Breast cancer Paternal 77    The patient's father died at the age of 79, the patient's mother at age 10. The patient has 2 brothers, no sisters. Rachna's maternal grandmother had a diagnosis of leukemia, type unknown, in her 52s. A maternal cousin has a history of lymphoma.  GYNECOLOGIC HISTORY:  No LMP recorded. Patient has had a hysterectomy. Menarche age 53, first live birth age 22, the patient is GX P3. She underwent a hysterectomy with bilateral salpingo-oophorectomy at age 52. She continues on hormone replacement.  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 Her husband "Zan" just retired from anesthesia July 2015. (Incidentally he was the anesthesiologist during my prostatectomy 2004). Their children are currently age 50, 52, and 23.  As of October 2020 they have 2 grandchildren.  The patient attends [employer] [place]   HEALTH MAINTENANCE: Social History   Tobacco Use  . Smoking status: Never Smoker  . Smokeless tobacco: Never Used  Substance Use Topics  . Alcohol use: No  . Drug use: No     Colonoscopy:  PAP:  Bone density:  Lipid panel:  Allergies  Allergen Reactions  . Latex Rash  . Tape Rash    Current Outpatient Medications  Medication Sig Dispense Refill  . ALPRAZolam (XANAX) 0.5 MG tablet     . calcium carbonate (OS-CAL) 1250 (500 CA) MG chewable  tablet Chew by mouth.    . cephALEXin (KEFLEX) 500 MG capsule Take 1 capsule (500 mg total) by mouth 4 (four) times daily for 5 days. 20 capsule 0  . Cholecalciferol (VITAMIN D3) 5000 UNITS TABS Take by mouth.    . estradiol (VIVELLE-DOT) 0.05 MG/24HR patch Place 1 patch onto the skin 2 (two) times a week.    . Fish Oil OIL Take by mouth.    [name] glucosamine-chondroitin 500-400 MG tablet Take by mouth.    [name] HYDROcodone-acetaminophen (NORCO) 5-325 MG tablet Take 1 tablet by mouth every 6 (six) hours as needed for up to 3 days for moderate pain. 12 tablet 0  . ibuprofen (ADVIL,MOTRIN) 200 MG tablet Take 200 mg by mouth.    . Multiple Vitamins-Minerals (MULTIVITAMIN WITH MINERALS) tablet Take 1 tablet by mouth daily.    . OMEGA-3 FATTY ACIDS-VITAMIN E PO 1,000 capsules. Take 2 capsules by mouth daily.    . zaleplon (SONATA) 10 MG capsule Take 10 mg by mouth at bedtime.     No current facility-administered medications for this visit.     OBJECTIVE: Middle-aged white woman who appears well  Vitals:   02/09/19 1625  BP: 132/69  Pulse: 70  Resp: 18  Temp: 98.7 F (37.1 C)  SpO2: 99%   Wt Readings from Last 3 Encounters:  02/09/19 116 lb 9.6 oz (52.9 kg)  02/06/19 118 lb 12.8 oz (53.9 kg)  07/08/18 115 lb 3.2 oz (52.3 kg)   Body mass index is 21.33 kg/m.    ECOG FS:1 - Symptomatic but completely ambulatory  Ocular: Sclerae unicteric, pupils round and equal Ear-nose-throat: Wearing a mask Lymphatic: No cervical or supraclavicular adenopathy Lungs no rales or rhonchi Heart regular rate and rhythm Abd soft, nontender, positive bowel sounds MSK no focal spinal tenderness, no joint edema Neuro: non-focal, well-oriented, appropriate affect Breasts: Deferred Skin: The area around the umbilicus is slightly erythematous and slightly irregular.  LAB RESULTS:  CMP     Component Value Date/Time   NA 142 02/07/2019 0755   NA 140 02/10/2017 1352   K 4.2 02/07/2019 0755   K 3.8  02/10/2017 1352   CL 108 02/07/2019 0755   CO2 25 02/07/2019 0755   CO2 25 02/10/2017 1352   GLUCOSE 101 (H) 02/07/2019 0755   GLUCOSE 94 02/10/2017 1352   BUN 16 02/07/2019 0755   BUN 17.0 02/10/2017 1352   CREATININE 0.74 02/07/2019 0755   CREATININE 0.79 08/04/2017 1242   CREATININE 0.8 02/10/2017 1352   CALCIUM 8.7 (L) 02/07/2019 0755   CALCIUM 9.5 02/10/2017 1352   PROT 6.5 02/07/2019 0755   PROT 6.9 02/10/2017 1352   ALBUMIN 3.7 02/07/2019 0755   ALBUMIN 4.0 02/10/2017 1352   AST 28 02/07/2019 0755   AST 32 08/04/2017 1242   AST 30 02/10/2017 1352   ALT 21 02/07/2019 0755   ALT 26 08/04/2017 1242   ALT 21 02/10/2017 1352   ALKPHOS 180 (H) 02/07/2019 0755   ALKPHOS 118 02/10/2017 1352   BILITOT 0.4 02/07/2019 0755   BILITOT 0.5 08/04/2017 1242   BILITOT 0.44 02/10/2017 1352   GFRNONAA >60 02/07/2019 0755   GFRNONAA >60 08/04/2017 1242   GFRAA >60 02/07/2019 0755  GFRAA >60 08/04/2017 1242    INo results found for: SPEP, UPEP  Lab Results  Component Value Date   WBC 45.1 (H) 02/07/2019   NEUTROABS 2.7 02/07/2019   HGB 11.6 (L) 02/07/2019   HCT 36.1 02/07/2019   MCV 94.8 02/07/2019   PLT 233 02/07/2019      Chemistry      Component Value Date/Time   NA 142 02/07/2019 0755   NA 140 02/10/2017 1352   K 4.2 02/07/2019 0755   K 3.8 02/10/2017 1352   CL 108 02/07/2019 0755   CO2 25 02/07/2019 0755   CO2 25 02/10/2017 1352   BUN 16 02/07/2019 0755   BUN 17.0 02/10/2017 1352   CREATININE 0.74 02/07/2019 0755   CREATININE 0.79 08/04/2017 1242   CREATININE 0.8 02/10/2017 1352      Component Value Date/Time   CALCIUM 8.7 (L) 02/07/2019 0755   CALCIUM 9.5 02/10/2017 1352   ALKPHOS 180 (H) 02/07/2019 0755   ALKPHOS 118 02/10/2017 1352   AST 28 02/07/2019 0755   AST 32 08/04/2017 1242   AST 30 02/10/2017 1352   ALT 21 02/07/2019 0755   ALT 26 08/04/2017 1242   ALT 21 02/10/2017 1352   BILITOT 0.4 02/07/2019 0755   BILITOT 0.5 08/04/2017 1242   BILITOT  0.44 02/10/2017 1352     Immunoglobulins remain normal.  The beta-2 microglobulin has actually decreased slightly.  The vitamin D 25-hydroxy level is adequate at above 40  No results found for: LABCA2  No components found for: LABCA125  No results for input(s): INR in the last 168 hours.  Urinalysis No results found for: COLORURINE, APPEARANCEUR, LABSPEC, PHURINE, GLUCOSEU, HGBUR, BILIRUBINUR, KETONESUR, PROTEINUR, UROBILINOGEN, NITRITE, LEUKOCYTESUR  STUDIES: Since her last visit, she underwent diagnostic bilateral mammography with CAD and tomography on 07/08/2017 at [facility] showing: breast density category B. There was no evidence of malignancy.   ASSESSMENT: 63 y.o. [place] woman s/p left axillary lymph node biopsy 06/29/2014 for a B-cell non-Hodgkin's lymphoma, consistent with marginal zone nodal lymphoma, advanced stage  (a) biopsy of a right breast mass 04/12/2018 showed 89% of the cells to be lambda restricted, with immunophenotype consistent with marginal zone non-Hodgkin's lymphoma (CD19, 20 and 23+  (1) signed up for [facility] 08/29/2014  (2) consider rituximab or ibrutinib if symptoms or cytopenias develop  (a)  serology at [facility] 08/29/2014 was negative for hepatitis C antibody, hepatitis B core antibody, hepatitis B surface antigen, hepatitis B surface antibody, or HIV antigen or antibody  (b) status post zoster vaccine 2019   PLAN: Ellen is now 4-1/2 years out from initial diagnosis of her marginal zone lymphoma.  She has not required treatment to date.  She has an excellent functional status.  She does have mild anemia.  This is likely anemia of "chronic illness".  It is minimal, and requires only follow-up.  She also has palpable adenopathy particularly in the right supraclavicular area and left axilla.  This is stable and has required no intervention.  Importantly her immunoglobulin levels are entirely normal.  Her absolute lymphocyte count, which rose  about a year ago, has stabilized.  We are going to go back to labs every 6 months and visit once a year as before.  She is aware of the need to call us if any "B" symptoms develop.  Kaylee Trivett, Virgie Dad, MD  02/09/19 5:06 PM Medical Oncology and Hematology [facility] [place], [place] [address] Tel. [phone]  Fax. [phone]  I, Jacqualyn Posey am acting as a Education administrator for Chauncey Cruel, MD.   I, Lurline Del MD, have reviewed the above documentation for accuracy and completeness, and I agree with the above.

## 2019-02-10 ENCOUNTER — Telehealth: Payer: Self-pay | Admitting: Oncology

## 2019-02-10 NOTE — Telephone Encounter (Signed)
I left a message regarding schedule  

## 2019-02-17 ENCOUNTER — Encounter (HOSPITAL_BASED_OUTPATIENT_CLINIC_OR_DEPARTMENT_OTHER): Payer: Self-pay | Admitting: *Deleted

## 2019-02-17 ENCOUNTER — Other Ambulatory Visit: Payer: Self-pay

## 2019-02-18 ENCOUNTER — Other Ambulatory Visit (HOSPITAL_COMMUNITY): Payer: BC Managed Care – PPO

## 2019-02-20 ENCOUNTER — Other Ambulatory Visit (HOSPITAL_COMMUNITY)
Admission: RE | Admit: 2019-02-20 | Discharge: 2019-02-20 | Disposition: A | Discharge status: Home / Self Care | Payer: BC Managed Care – PPO | Source: Ambulatory Visit | Attending: Plastic Surgery | Admitting: Plastic Surgery

## 2019-02-20 DIAGNOSIS — Z01812 Encounter for preprocedural laboratory examination: Secondary | ICD-10-CM | POA: Diagnosis not present

## 2019-02-20 DIAGNOSIS — Z20828 Contact with and (suspected) exposure to other viral communicable diseases: Secondary | ICD-10-CM | POA: Insufficient documentation

## 2019-02-21 LAB — NOVEL CORONAVIRUS, NAA (HOSP ORDER, SEND-OUT TO REF LAB; TAT 18-24 HRS): SARS-CoV-2, NAA: NOT DETECTED

## 2019-02-23 ENCOUNTER — Encounter (HOSPITAL_BASED_OUTPATIENT_CLINIC_OR_DEPARTMENT_OTHER)
Admission: RE | Disposition: A | Discharge status: Home / Self Care | Payer: Self-pay | Source: Home / Self Care | Attending: Plastic Surgery

## 2019-02-23 ENCOUNTER — Ambulatory Visit (HOSPITAL_BASED_OUTPATIENT_CLINIC_OR_DEPARTMENT_OTHER): Payer: BC Managed Care – PPO | Admitting: Anesthesiology

## 2019-02-23 ENCOUNTER — Ambulatory Visit: Payer: BLUE CROSS/BLUE SHIELD | Admitting: Oncology

## 2019-02-23 ENCOUNTER — Other Ambulatory Visit: Payer: Self-pay

## 2019-02-23 ENCOUNTER — Ambulatory Visit (HOSPITAL_BASED_OUTPATIENT_CLINIC_OR_DEPARTMENT_OTHER)
Admission: RE | Admit: 2019-02-23 | Discharge: 2019-02-23 | Disposition: A | Discharge status: Home / Self Care | Payer: BC Managed Care – PPO | Attending: Plastic Surgery | Admitting: Plastic Surgery

## 2019-02-23 ENCOUNTER — Encounter (HOSPITAL_BASED_OUTPATIENT_CLINIC_OR_DEPARTMENT_OTHER): Payer: Self-pay | Admitting: Certified Registered Nurse Anesthetist

## 2019-02-23 ENCOUNTER — Telehealth: Payer: Self-pay | Admitting: Plastic Surgery

## 2019-02-23 DIAGNOSIS — L720 Epidermal cyst: Secondary | ICD-10-CM | POA: Diagnosis not present

## 2019-02-23 DIAGNOSIS — Z79890 Hormone replacement therapy (postmenopausal): Secondary | ICD-10-CM | POA: Insufficient documentation

## 2019-02-23 DIAGNOSIS — Z79899 Other long term (current) drug therapy: Secondary | ICD-10-CM | POA: Diagnosis not present

## 2019-02-23 DIAGNOSIS — S31105A Unspecified open wound of abdominal wall, periumbilic region without penetration into peritoneal cavity, initial encounter: Secondary | ICD-10-CM | POA: Diagnosis not present

## 2019-02-23 DIAGNOSIS — Z8572 Personal history of non-Hodgkin lymphomas: Secondary | ICD-10-CM | POA: Insufficient documentation

## 2019-02-23 DIAGNOSIS — C884 Extranodal marginal zone B-cell lymphoma of mucosa-associated lymphoid tissue [MALT-lymphoma]: Secondary | ICD-10-CM | POA: Diagnosis not present

## 2019-02-23 DIAGNOSIS — M8580 Other specified disorders of bone density and structure, unspecified site: Secondary | ICD-10-CM | POA: Diagnosis not present

## 2019-02-23 HISTORY — PX: MASS EXCISION: SHX2000

## 2019-02-23 SURGERY — EXCISION MASS
Anesthesia: General | Site: Abdomen

## 2019-02-23 MED ORDER — SODIUM CHLORIDE 0.9% FLUSH: 3.0000 mL | INTRAVENOUS | Status: DC | PRN

## 2019-02-23 MED ORDER — SODIUM CHLORIDE 0.9% FLUSH: 3.0000 mL | Freq: Two times a day (BID) | INTRAVENOUS | Status: DC

## 2019-02-23 MED ORDER — PHENYLEPHRINE 40 MCG/ML (10ML) SYRINGE FOR IV PUSH (FOR BLOOD PRESSURE SUPPORT)
PREFILLED_SYRINGE | INTRAVENOUS | Status: DC | PRN
  Administered 2019-02-23: 80 ug via INTRAVENOUS

## 2019-02-23 MED ORDER — MIDAZOLAM HCL 2 MG/2ML IJ SOLN
INTRAMUSCULAR | Status: AC
  Filled 2019-02-23: qty 2

## 2019-02-23 MED ORDER — BACITRACIN 500 UNIT/GM EX OINT
TOPICAL_OINTMENT | CUTANEOUS | Status: DC | PRN
  Administered 2019-02-23: 1 via TOPICAL

## 2019-02-23 MED ORDER — ACETAMINOPHEN 160 MG/5ML PO SOLN: 325.0000 mg | ORAL | Status: DC | PRN

## 2019-02-23 MED ORDER — CHLORHEXIDINE GLUCONATE CLOTH 2 % EX PADS: 6.0000 | MEDICATED_PAD | Freq: Once | CUTANEOUS | Status: DC

## 2019-02-23 MED ORDER — ONDANSETRON HCL 4 MG/2ML IJ SOLN
INTRAMUSCULAR | Status: AC
  Filled 2019-02-23: qty 2

## 2019-02-23 MED ORDER — KETOROLAC TROMETHAMINE 30 MG/ML IJ SOLN
INTRAMUSCULAR | Status: AC
  Filled 2019-02-23: qty 1

## 2019-02-23 MED ORDER — LIDOCAINE 2% (20 MG/ML) 5 ML SYRINGE
INTRAMUSCULAR | Status: DC | PRN
  Administered 2019-02-23: 60 mg via INTRAVENOUS

## 2019-02-23 MED ORDER — ONDANSETRON HCL 4 MG/2ML IJ SOLN
INTRAMUSCULAR | Status: DC | PRN
  Administered 2019-02-23: 4 mg via INTRAVENOUS

## 2019-02-23 MED ORDER — ACETAMINOPHEN 325 MG PO TABS: 325.0000 mg | ORAL_TABLET | ORAL | Status: DC | PRN

## 2019-02-23 MED ORDER — MEPERIDINE HCL 25 MG/ML IJ SOLN: 6.2500 mg | INTRAMUSCULAR | Status: DC | PRN

## 2019-02-23 MED ORDER — ONDANSETRON HCL 4 MG/2ML IJ SOLN: 4.0000 mg | Freq: Once | INTRAMUSCULAR | Status: DC | PRN

## 2019-02-23 MED ORDER — LACTATED RINGERS IV SOLN
INTRAVENOUS | Status: DC
  Administered 2019-02-23: 13:00:00 via INTRAVENOUS

## 2019-02-23 MED ORDER — MIDAZOLAM HCL 2 MG/2ML IJ SOLN: 1.0000 mg | INTRAMUSCULAR | Status: DC | PRN

## 2019-02-23 MED ORDER — ACETAMINOPHEN 325 MG PO TABS: 650.0000 mg | ORAL_TABLET | ORAL | Status: DC | PRN

## 2019-02-23 MED ORDER — FENTANYL CITRATE (PF) 100 MCG/2ML IJ SOLN: 50.0000 ug | INTRAMUSCULAR | Status: DC | PRN

## 2019-02-23 MED ORDER — KETOROLAC TROMETHAMINE 30 MG/ML IJ SOLN
INTRAMUSCULAR | Status: DC | PRN
  Administered 2019-02-23: 30 mg via INTRAVENOUS

## 2019-02-23 MED ORDER — DEXAMETHASONE SODIUM PHOSPHATE 10 MG/ML IJ SOLN
INTRAMUSCULAR | Status: DC | PRN
  Administered 2019-02-23: 10 mg via INTRAVENOUS

## 2019-02-23 MED ORDER — MIDAZOLAM HCL 5 MG/5ML IJ SOLN
INTRAMUSCULAR | Status: DC | PRN
  Administered 2019-02-23: 2 mg via INTRAVENOUS

## 2019-02-23 MED ORDER — OXYCODONE HCL 5 MG PO TABS: 5.0000 mg | ORAL_TABLET | Freq: Once | ORAL | Status: DC | PRN

## 2019-02-23 MED ORDER — BACITRACIN ZINC 500 UNIT/GM EX OINT
TOPICAL_OINTMENT | CUTANEOUS | Status: AC
  Filled 2019-02-23: qty 28.35

## 2019-02-23 MED ORDER — BACITRACIN ZINC 500 UNIT/GM EX OINT
TOPICAL_OINTMENT | CUTANEOUS | Status: AC
  Filled 2019-02-23: qty 0.9

## 2019-02-23 MED ORDER — CEFAZOLIN SODIUM-DEXTROSE 2-4 GM/100ML-% IV SOLN
INTRAVENOUS | Status: AC
  Filled 2019-02-23: qty 100

## 2019-02-23 MED ORDER — LIDOCAINE-EPINEPHRINE 1 %-1:100000 IJ SOLN
INTRAMUSCULAR | Status: DC | PRN
  Administered 2019-02-23: 3 mL

## 2019-02-23 MED ORDER — OXYCODONE HCL 5 MG/5ML PO SOLN: 5.0000 mg | Freq: Once | ORAL | Status: DC | PRN

## 2019-02-23 MED ORDER — FENTANYL CITRATE (PF) 100 MCG/2ML IJ SOLN: 25.0000 ug | INTRAMUSCULAR | Status: DC | PRN

## 2019-02-23 MED ORDER — FENTANYL CITRATE (PF) 100 MCG/2ML IJ SOLN
INTRAMUSCULAR | Status: AC
  Filled 2019-02-23: qty 2

## 2019-02-23 MED ORDER — ACETAMINOPHEN 650 MG RE SUPP: 650.0000 mg | RECTAL | Status: DC | PRN

## 2019-02-23 MED ORDER — SODIUM CHLORIDE 0.9 % IV SOLN: 250.0000 mL | INTRAVENOUS | Status: DC | PRN

## 2019-02-23 MED ORDER — PROPOFOL 10 MG/ML IV BOLUS
INTRAVENOUS | Status: DC | PRN
  Administered 2019-02-23: 160 mg via INTRAVENOUS

## 2019-02-23 MED ORDER — FENTANYL CITRATE (PF) 100 MCG/2ML IJ SOLN
INTRAMUSCULAR | Status: DC | PRN
  Administered 2019-02-23 (×2): 50 ug via INTRAVENOUS

## 2019-02-23 MED ORDER — LIDOCAINE 2% (20 MG/ML) 5 ML SYRINGE
INTRAMUSCULAR | Status: AC
  Filled 2019-02-23: qty 5

## 2019-02-23 MED ORDER — PHENYLEPHRINE 40 MCG/ML (10ML) SYRINGE FOR IV PUSH (FOR BLOOD PRESSURE SUPPORT)
PREFILLED_SYRINGE | INTRAVENOUS | Status: AC
  Filled 2019-02-23: qty 20

## 2019-02-23 MED ORDER — CEFAZOLIN SODIUM-DEXTROSE 2-4 GM/100ML-% IV SOLN
2.0000 g | INTRAVENOUS | Status: AC
  Administered 2019-02-23: 2 g via INTRAVENOUS

## 2019-02-23 MED ORDER — OXYCODONE HCL 5 MG PO TABS: 5.0000 mg | ORAL_TABLET | ORAL | Status: DC | PRN

## 2019-02-23 SURGICAL SUPPLY — 48 items
BLADE HEX COATED 2.75 (ELECTRODE) ×3 IMPLANT
BLADE SURG 15 STRL LF DISP TIS (BLADE) ×1 IMPLANT
BLADE SURG 15 STRL SS (BLADE) ×2
CHLORAPREP W/TINT 26 (MISCELLANEOUS) ×2 IMPLANT
COVER BACK TABLE REUSABLE LG (DRAPES) ×3 IMPLANT
COVER MAYO STAND REUSABLE (DRAPES) ×3 IMPLANT
DECANTER SPIKE VIAL GLASS SM (MISCELLANEOUS) IMPLANT
DRAPE LAPAROSCOPIC ABDOMINAL (DRAPES) ×2 IMPLANT
DRAPE LAPAROTOMY 100X72 PEDS (DRAPES) IMPLANT
DRESSING DUODERM 4X4 STERILE (GAUZE/BANDAGES/DRESSINGS) IMPLANT
DRSG ADAPTIC 3X8 NADH LF (GAUZE/BANDAGES/DRESSINGS) IMPLANT
DRSG EMULSION OIL 3X3 NADH (GAUZE/BANDAGES/DRESSINGS) IMPLANT
DRSG MEPILEX BORDER 4X4 (GAUZE/BANDAGES/DRESSINGS) ×2 IMPLANT
DRSG PAD ABDOMINAL 8X10 ST (GAUZE/BANDAGES/DRESSINGS) IMPLANT
DRSG TEGADERM 4X10 (GAUZE/BANDAGES/DRESSINGS) IMPLANT
DRSG TELFA 3X8 NADH (GAUZE/BANDAGES/DRESSINGS) IMPLANT
ELECT REM PT RETURN 9FT ADLT (ELECTROSURGICAL) ×3
ELECTRODE REM PT RTRN 9FT ADLT (ELECTROSURGICAL) ×1 IMPLANT
EVACUATOR SILICONE 100CC (DRAIN) IMPLANT
GAUZE SPONGE 4X4 12PLY STRL (GAUZE/BANDAGES/DRESSINGS) IMPLANT
GAUZE XEROFORM 1X8 LF (GAUZE/BANDAGES/DRESSINGS) ×2 IMPLANT
GAUZE XEROFORM 5X9 LF (GAUZE/BANDAGES/DRESSINGS) IMPLANT
GLOVE SURG SS PI 6.5 STRL IVOR (GLOVE) ×8 IMPLANT
GLOVE SURG SS PI 7.0 STRL IVOR (GLOVE) ×2 IMPLANT
GOWN STRL REUS W/ TWL LRG LVL3 (GOWN DISPOSABLE) ×2 IMPLANT
GOWN STRL REUS W/TWL LRG LVL3 (GOWN DISPOSABLE) ×6
NDL HYPO 25X1 1.5 SAFETY (NEEDLE) ×1 IMPLANT
NEEDLE HYPO 25X1 1.5 SAFETY (NEEDLE) ×3 IMPLANT
NS IRRIG 1000ML POUR BTL (IV SOLUTION) ×3 IMPLANT
PACK BASIN DAY SURGERY FS (CUSTOM PROCEDURE TRAY) ×3 IMPLANT
PAD DRESSING TELFA 3X8 NADH (GAUZE/BANDAGES/DRESSINGS) IMPLANT
PENCIL BUTTON HOLSTER BLD 10FT (ELECTRODE) ×3 IMPLANT
SLEEVE SCD COMPRESS KNEE MED (MISCELLANEOUS) ×3 IMPLANT
SPONGE LAP 18X18 RF (DISPOSABLE) ×3 IMPLANT
STAPLER VISISTAT 35W (STAPLE) IMPLANT
SUT MNCRL AB 3-0 PS2 18 (SUTURE) IMPLANT
SUT MNCRL AB 4-0 PS2 18 (SUTURE) ×2 IMPLANT
SUT MON AB 5-0 PS2 18 (SUTURE) ×2 IMPLANT
SUT SILK 3 0 PS 1 (SUTURE) IMPLANT
SWAB COLLECTION DEVICE MRSA (MISCELLANEOUS) IMPLANT
SYR BULB IRRIGATION 50ML (SYRINGE) IMPLANT
SYR CONTROL 10ML LL (SYRINGE) ×3 IMPLANT
TOWEL GREEN STERILE FF (TOWEL DISPOSABLE) ×6 IMPLANT
TRAY DSU PREP LF (CUSTOM PROCEDURE TRAY) ×3 IMPLANT
TUBE CONNECTING 20'X1/4 (TUBING) ×1
TUBE CONNECTING 20X1/4 (TUBING) ×2 IMPLANT
UNDERPAD 30X36 HEAVY ABSORB (UNDERPADS AND DIAPERS) ×3 IMPLANT
YANKAUER SUCT BULB TIP NO VENT (SUCTIONS) ×3 IMPLANT

## 2019-02-23 NOTE — Interval H&P Note (Signed)
History and Physical Interval Note:  02/23/2019 12:52 PM  Dana Butler  has presented today for surgery, with the diagnosis of umbilical wound.  The various methods of treatment have been discussed with the patient and family. After consideration of risks, benefits and other options for treatment, the patient has consented to  Procedure(s): Excision of umbilical wound and possible foreign body removal with acell or integra (N/A) as a surgical intervention.  The patient's history has been reviewed, patient examined, no change in status, stable for surgery.  I have reviewed the patient's chart and labs.  Questions were answered to the patient's satisfaction.     Dana Butler

## 2019-02-23 NOTE — Anesthesia Preprocedure Evaluation (Addendum)
Anesthesia Evaluation  Patient identified by MRN, date of birth, ID band Patient awake    Reviewed: Allergy & Precautions, H&P , NPO status , Patient's Chart, lab work & pertinent test results  Airway Mallampati: I  TM Distance: >3 FB Neck ROM: Full  Mouth opening: Limited Mouth Opening  Dental  (+) Teeth Intact, Dental Advisory Given   Pulmonary    breath sounds clear to auscultation       Cardiovascular  Rhythm:Regular Rate:Normal     Neuro/Psych    GI/Hepatic   Endo/Other    Renal/GU      Musculoskeletal   Abdominal   Peds  Hematology   Anesthesia Other Findings   Reproductive/Obstetrics                             Anesthesia Physical  Anesthesia Plan  ASA: II  Anesthesia Plan: General   Post-op Pain Management:    Induction: Intravenous  PONV Risk Score and Plan: 3 and Ondansetron, Dexamethasone, Treatment may vary due to age or medical condition and Midazolam  Airway Management Planned: LMA and Oral ETT  Additional Equipment:   Intra-op Plan:   Post-operative Plan: Extubation in OR  Informed Consent: I have reviewed the patients History and Physical, chart, labs and discussed the procedure including the risks, benefits and alternatives for the proposed anesthesia with the patient or authorized representative who has indicated his/her understanding and acceptance.     Dental advisory given  Plan Discussed with: Anesthesiologist, CRNA and Surgeon  Anesthesia Plan Comments: ( )        Anesthesia Quick Evaluation

## 2019-02-23 NOTE — Transfer of Care (Signed)
Immediate Anesthesia Transfer of Care Note  Patient: Dana Butler  Procedure(s) Performed: Excision of umbilicus skin lesion (N/A Abdomen)  Patient Location: PACU  Anesthesia Type:General  Level of Consciousness: awake, alert  and oriented  Airway & Oxygen Therapy: Patient Spontanous Breathing and Patient connected to face mask oxygen  Post-op Assessment: Report given to RN and Post -op Vital signs reviewed and stable  Post vital signs: Reviewed and stable  Last Vitals:  Vitals Value Taken Time  BP 106/72   Temp    Pulse 83 02/23/19 1411  Resp 14 02/23/19 1411  SpO2 99 % 02/23/19 1411  Vitals shown include unvalidated device data.  Last Pain:  Vitals:   02/23/19 1255  TempSrc: Oral  PainSc: 0-No pain      Patients Stated Pain Goal: 0 ([id number])  Complications: No apparent anesthesia complications

## 2019-02-23 NOTE — Anesthesia Procedure Notes (Signed)
Procedure Name: LMA Insertion Date/Time: 02/23/2019 1:36 PM Performed by: Genelle Bal, CRNA Pre-anesthesia Checklist: Patient identified, Emergency Drugs available, Suction available and Patient being monitored Patient Re-evaluated:Patient Re-evaluated prior to induction Oxygen Delivery Method: Circle system utilized Preoxygenation: Pre-oxygenation with 100% oxygen Induction Type: IV induction Ventilation: Mask ventilation without difficulty LMA: LMA inserted LMA Size: 4.0 Number of attempts: 1 Airway Equipment and Method: Bite block Placement Confirmation: positive ETCO2 Tube secured with: Tape Dental Injury: Teeth and Oropharynx as per pre-operative assessment

## 2019-02-23 NOTE — Anesthesia Postprocedure Evaluation (Signed)
Anesthesia Post Note  Patient: Dana Butler  Procedure(s) Performed: Excision of umbilicus skin lesion (N/A Abdomen)     Patient location during evaluation: PACU Anesthesia Type: General Level of consciousness: awake and alert Pain management: pain level controlled Vital Signs Assessment: post-procedure vital signs reviewed and stable Respiratory status: spontaneous breathing, nonlabored ventilation, respiratory function stable and patient connected to nasal cannula oxygen Cardiovascular status: blood pressure returned to baseline and stable Postop Assessment: no apparent nausea or vomiting Anesthetic complications: no    Last Vitals:  Vitals:   02/23/19 1430 02/23/19 1530  BP: 110/62 131/72  Pulse: 78 83  Resp: 13 16  Temp:  36.6 C  SpO2: 99% 96%    Last Pain:  Vitals:   02/23/19 1530  TempSrc: Oral  PainSc:                  Lex Linhares

## 2019-02-23 NOTE — Discharge Instructions (Signed)
INSTRUCTIONS FOR AFTER SURGERY   You are having surgery.  You will likely have some questions about what to expect following your operation.  The following information will help you and your family understand what to expect when you are discharged from the hospital.  Following these guidelines will help ensure a smooth recovery and reduce risks of complications.   Postoperative instructions include information on: diet, wound care, medications and physical activity.  AFTER SURGERY Expect to go home after the procedure.  In some cases, you may need to spend one night in the hospital for observation.  DIET This surgery does not require a specific diet.  However, I have to mention that the healthier you eat the better your body can start healing. It is important to increasing your protein intake.  This means limiting the foods with sugar and carbohydrates.  Focus on vegetables and some meat.  If you have any liposuction during your procedure be sure to drink water.  If your urine is bright yellow, then it is concentrated, and you need to drink more water.  As a general rule after surgery, you should have 8 ounces of water every hour while awake.  If you find you are persistently nauseated or unable to take in liquids let us know.  NO TOBACCO USE or EXPOSURE.  This will slow your healing process and increase the risk of a wound.  WOUND CARE If you don't have a drain: You can shower the day after surgery.  Use fragrance free soap.  Dial, [place] and [place] are usually mild on the skin.  Vaseline to the area daily with a dry dressing. We close your incision to leave the smallest and best-looking scar. No ointment or creams on your incisions until given the go ahead.  Especially not Neosporin (Too many skin reactions with this one).  A few weeks after surgery you can use Mederma and start massaging the scar.  ACTIVITY No heavy lifting until cleared by the doctor.  It is OK to walk and climb stairs. In fact,  moving your legs is very important to decrease your risk of a blood clot.  It will also help keep you from getting deconditioned.  Every 1 to 2 hours get up and walk for 5 minutes. This will help with a quicker recovery back to normal.  Let pain be your guide so you don't do too much.  NO, you cannot do the spring cleaning and don't plan on taking care of anyone else.  This is your time for TLC.  You will be more comfortable if you sleep and rest with your head elevated either with a few pillows under you or in a recliner.  No stomach sleeping for a few months.  WORK Everyone returns to work at different times. As a rough guide, most people take at least 1 - 2 weeks off prior to returning to work. If you need documentation for your job, bring the forms to your postoperative follow up visit.  DRIVING Arrange for someone to bring you home from the hospital.  You may be able to drive a few days after surgery but not while taking any narcotics or valium.  BOWEL MOVEMENTS Constipation can occur after anesthesia and while taking pain medication.  It is important to stay ahead for your comfort.  We recommend taking Milk of Magnesia (2 tablespoons; twice a day) while taking the pain pills.  SEROMA This is fluid your body tried to put in the surgical site.  This is normal but if it creates tight skinny skin let us know.  It usually decreases in a few weeks.  WHEN TO CALL Call your surgeon's office if any of the following occur:  Fever 101 degrees F or greater  Excessive bleeding or fluid from the incision site.  Pain that increases over time without aid from the medications  Redness, warmth, or pus draining from incision sites  Persistent nausea or inability to take in liquids  Severe misshapen area that underwent the operation.   Post Anesthesia Home Care Instructions  Activity: Get plenty of rest for the remainder of the day. A responsible individual must stay with you for 24 hours  following the procedure.  For the next 24 hours, DO NOT: -Drive a car -Paediatric nurse -Drink alcoholic beverages -Take any medication unless instructed by your physician -Make any legal decisions or sign important papers.  Meals: Start with liquid foods such as gelatin or soup. Progress to regular foods as tolerated. Avoid greasy, spicy, heavy foods. If nausea and/or vomiting occur, drink only clear liquids until the nausea and/or vomiting subsides. Call your physician if vomiting continues.  Special Instructions/Symptoms: Your throat may feel dry or sore from the anesthesia or the breathing tube placed in your throat during surgery. If this causes discomfort, gargle with warm salt water. The discomfort should disappear within 24 hours.  If you had a scopolamine patch placed behind your ear for the management of post- operative nausea and/or vomiting:  1. The medication in the patch is effective for 72 hours, after which it should be removed.  Wrap patch in a tissue and discard in the trash. Wash hands thoroughly with soap and water. 2. You may remove the patch earlier than 72 hours if you experience unpleasant side effects which may include dry mouth, dizziness or visual disturbances. 3. Avoid touching the patch. Wash your hands with soap and water after contact with the patch.

## 2019-02-23 NOTE — Telephone Encounter (Signed)
Spoke with Candice Camp at [facility], [facility] and Dr. Audelia Hives are in network with the patient's policy - [phone].

## 2019-02-23 NOTE — Op Note (Addendum)
DATE OF OPERATION: 02/23/2019  LOCATION: [facility]  PREOPERATIVE DIAGNOSIS: Umbilical wound with possible foreign body  POSTOPERATIVE DIAGNOSIS: Inclusion site of umbilicus  PROCEDURE: Excision of umbilical skin / cyst 1 x 2 cm with primary closure.  SURGEON: Neshawn Aird Sanger Mylei Brackeen, DO  EBL: 5 cc  CONDITION: Stable  COMPLICATIONS: None  INDICATION: The patient, Dana Butler, is a 63 y.o. female born on 1956-03-22, is here for treatment of a foreign body / retained suture of the umbilical area.   PROCEDURE DETAILS:  The patient was seen prior to surgery and marked.  The IV antibiotics were given. The patient was taken to the operating room and given a general anesthetic. A standard time out was performed and all information was confirmed by those in the room. SCDs were placed.   The abdomen was prepped and draped.  Local with epinepherine was injected for intraoperative hemostasis and postoperative pain control.  The area was marked and the #15 blade was used to make the incision and excise the 1 x 2 cm area of wound, skin and soft tissue.  The retractors were used to explore the area.  No foreign body or stitch was visualized.  The deep layer was closed with the 4-0 Monocryl followed by the 5-0 Monocryl.  A sterile dressing was applied.  The patient was allowed to wake up and taken to recovery room in stable condition at the end of the case. The family was notified at the end of the case.

## 2019-02-24 LAB — SURGICAL PATHOLOGY

## 2019-02-27 ENCOUNTER — Encounter (HOSPITAL_BASED_OUTPATIENT_CLINIC_OR_DEPARTMENT_OTHER): Payer: Self-pay | Admitting: Plastic Surgery

## 2019-02-28 LAB — AEROBIC/ANAEROBIC CULTURE W GRAM STAIN (SURGICAL/DEEP WOUND)
Culture: NO GROWTH
Gram Stain: NONE SEEN

## 2019-03-03 ENCOUNTER — Encounter: Payer: Self-pay | Admitting: Surgical

## 2019-03-03 ENCOUNTER — Other Ambulatory Visit: Payer: Self-pay

## 2019-03-03 ENCOUNTER — Ambulatory Visit (INDEPENDENT_AMBULATORY_CARE_PROVIDER_SITE_OTHER): Payer: BC Managed Care – PPO | Admitting: Surgical

## 2019-03-03 VITALS — BP 108/67 | HR 77 | Temp 97.5°F | Ht 62.0 in | Wt 115.6 lb

## 2019-03-03 DIAGNOSIS — S31105A Unspecified open wound of abdominal wall, periumbilic region without penetration into peritoneal cavity, initial encounter: Secondary | ICD-10-CM

## 2019-03-03 DIAGNOSIS — L723 Sebaceous cyst: Secondary | ICD-10-CM

## 2019-03-03 NOTE — Progress Notes (Signed)
   Subjective:     Patient ID: Dana Butler, female    DOB: 1956-04-13, 63 y.o.   MRN: [id number]  Chief Complaint  Patient presents with  . Post-op Follow-up    excision of umbilical wound and poss foreign body    HPI: The patient is a 63 y.o. female here for follow-up after excision of umbilical skin lesion on 02/23/19.  Pathology report showed epidermal inclusion cyst. No foreign body was noted during the procedure.  Dana Butler reports she is doing well.  Pain is easily controlled.  No complaints.  Incision is C/D/I, no dehiscence noted.  There is some slight surrounding erythema, but she reports that it was erythematous prior to surgery and had been erythematous for some time.  There is no sign of infection, seroma, hematoma.  Monocryl sutures still in place.   Denies any fever, chills, nausea, vomiting.  She reports that she was stung by yellow jacket approximately 3 to 4 weeks ago and had a rash on her ankle.  She reports the rash in her ankle has resolved but she developed a rash on her lower leg.  She has been using hydrocortisone cream on this.  She reports that it has not worsened but it is not improving.    Review of Systems  Constitutional: Positive for activity change. Negative for appetite change, chills, diaphoresis, fatigue and fever.  Respiratory: Negative.   Cardiovascular: Negative.   Gastrointestinal: Negative for abdominal distention, abdominal pain, diarrhea, nausea and vomiting.  Musculoskeletal: Negative.   Skin: Positive for color change and rash. Negative for pallor.  Neurological: Negative.    Objective:   Vital Signs BP 108/67 (BP Location: Left Arm, Patient Position: Sitting, Cuff Size: Normal)   Pulse 77   Temp (!) 97.5 F (36.4 C) (Temporal)   Ht 5\' 2"  (1.575 m)   Wt 115 lb 9.6 oz (52.4 kg)   SpO2 95%   BMI 21.14 kg/m  Vital Signs and Nursing Note Reviewed  Physical Exam  Constitutional: She is oriented to person, place, and time  and well-developed, well-nourished, and in no distress.  HENT:  Head: Normocephalic and atraumatic.  Cardiovascular: Normal rate.  Pulmonary/Chest: Effort normal.  Abdominal:    Musculoskeletal: Normal range of motion.  Neurological: She is alert and oriented to person, place, and time. Gait normal.  Skin: Skin is warm and dry. No rash noted. She is not diaphoretic. No erythema. No pallor.     Psychiatric: Mood and affect normal.      Assessment/Plan:     ICD-10-CM   1. Open wound of umbilical region, initial encounter  S31.105A   2. Sebaceous cyst  L72.3    Dana Butler is healing well.  The periwound area is slightly erythematous, but no sign of infection at this time.  She reports that this erythema is chronic and she has had it for quite some time.  To her it does not look worse than normal.  Suture knots cut along midline abdominal incision. Bacitracin ointment applied over midline incision, 4 x 4 gauze, Medipore tape.  Begin using Mederma cream in 2 to 3 weeks pending healing process.  Do not use if open wound still noted at that time.  She can begin increasing yard work over the next few weeks exercise abdominal muscles in approximately 5 weeks.  Pending pain level.  Call with any questions or concerns follow-up as needed.  Carola Rhine Sira Adsit, PA-C 03/03/2019, 10:01 AM

## 2019-03-08 ENCOUNTER — Telehealth: Payer: Self-pay

## 2019-03-08 MED ORDER — TRIAMCINOLONE ACETONIDE 0.025 % EX OINT: 1.0000 "application " | TOPICAL_OINTMENT | Freq: Two times a day (BID) | CUTANEOUS | 0 refills | Status: DC

## 2019-03-08 NOTE — Telephone Encounter (Signed)
Call from pt re: c/o rash on her abdomen & chest She is s/p excision of umbilical skin lesion on 02/23/2019 she was seen by Catalina Antigua, [place] for f/u visit on 03/03/2019. At the time of her f/u visit- she did indicate that she has a localized rash approximately 2" from the umbilicus incision- which she was treating with OTC hydrocortisone cream & OTC Benadryl She is calling today to report that the rash has advanced to entire abdomen to under her breast & to her pelvic area, and she reports difficulty sleeping d/t the "itching".  She is requesting a RX for steroid cream, which she was told by [facility] to call for if it worsened. I advised her I would forward this request to our provider & someone would call her back with the next plan of care. Pt agrees with plan. [employer]

## 2019-03-08 NOTE — Telephone Encounter (Signed)
Dana Butler reports she developed a rash from her suprapubic area to her IMF after seeing me for her f/u visit. Possible rxn to surgical prep.  Recommend benadryl nightly, allegra or zyrtec daily and rx for triamcinolone ointment sent to pharmacy. Avoid over use of steroid. Continue with allegra and benadryl minimum for 2-3 weeks.  Call with any further questions or concerns. No fever, chills, n/v.

## 2019-03-14 ENCOUNTER — Other Ambulatory Visit: Payer: Self-pay

## 2019-03-14 DIAGNOSIS — Z20822 Contact with and (suspected) exposure to covid-19: Secondary | ICD-10-CM

## 2019-03-16 LAB — NOVEL CORONAVIRUS, NAA: SARS-CoV-2, NAA: NOT DETECTED

## 2019-03-29 DIAGNOSIS — L814 Other melanin hyperpigmentation: Secondary | ICD-10-CM | POA: Diagnosis not present

## 2019-03-29 DIAGNOSIS — L821 Other seborrheic keratosis: Secondary | ICD-10-CM | POA: Diagnosis not present

## 2019-03-29 DIAGNOSIS — C44612 Basal cell carcinoma of skin of right upper limb, including shoulder: Secondary | ICD-10-CM | POA: Diagnosis not present

## 2019-03-29 DIAGNOSIS — D2272 Melanocytic nevi of left lower limb, including hip: Secondary | ICD-10-CM | POA: Diagnosis not present

## 2019-03-29 DIAGNOSIS — L239 Allergic contact dermatitis, unspecified cause: Secondary | ICD-10-CM | POA: Diagnosis not present

## 2019-05-05 ENCOUNTER — Encounter: Payer: BC Managed Care – PPO | Admitting: Plastic Surgery

## 2019-05-13 ENCOUNTER — Ambulatory Visit: Payer: BC Managed Care – PPO | Attending: Internal Medicine

## 2019-05-13 DIAGNOSIS — Z23 Encounter for immunization: Secondary | ICD-10-CM | POA: Insufficient documentation

## 2019-05-13 NOTE — Progress Notes (Signed)
   Covid-19 Vaccination Clinic  Name:  Dana Butler    MRN: [id number] DOB: Oct 16, 1955  05/13/2019  Dana Butler was observed post Covid-19 immunization for 15 minutes without incidence. She was provided with Vaccine Information Sheet and instruction to access the V-Safe system.   Dana Butler was instructed to call 911 with any severe reactions post vaccine: [name] Difficulty breathing  . Swelling of your face and throat  . A fast heartbeat  . A bad rash all over your body  . Dizziness and weakness    Immunizations Administered    Name Date Dose VIS Date Route   Pfizer COVID-19 Vaccine 05/13/2019 12:18 PM 0.3 mL 04/07/2019 Intramuscular   Manufacturer: [place]   Lot: [id number]   [place]: [id number]

## 2019-05-31 ENCOUNTER — Ambulatory Visit: Payer: BC Managed Care – PPO | Attending: Internal Medicine

## 2019-05-31 DIAGNOSIS — Z23 Encounter for immunization: Secondary | ICD-10-CM | POA: Insufficient documentation

## 2019-05-31 NOTE — Progress Notes (Signed)
   Covid-19 Vaccination Clinic  Name:  Dana Butler    MRN: [id number] DOB: 14-Dec-1955  05/31/2019  Dana Butler was observed post Covid-19 immunization for 15 minutes without incidence. She was provided with Vaccine Information Sheet and instruction to access the V-Safe system.   Dana Butler was instructed to call 911 with any severe reactions post vaccine: [name] Difficulty breathing  . Swelling of your face and throat  . A fast heartbeat  . A bad rash all over your body  . Dizziness and weakness    Immunizations Administered    Name Date Dose VIS Date Route   Pfizer COVID-19 Vaccine 05/31/2019  8:30 AM 0.3 mL 04/07/2019 Intramuscular   Manufacturer: [place]   Lot: [id number]   [place]: [id number]

## 2019-08-10 ENCOUNTER — Inpatient Hospital Stay: Payer: BC Managed Care – PPO | Attending: Oncology

## 2019-08-10 ENCOUNTER — Telehealth: Payer: Self-pay

## 2019-08-10 ENCOUNTER — Other Ambulatory Visit: Payer: Self-pay

## 2019-08-10 DIAGNOSIS — D649 Anemia, unspecified: Secondary | ICD-10-CM | POA: Insufficient documentation

## 2019-08-10 DIAGNOSIS — Z8572 Personal history of non-Hodgkin lymphomas: Secondary | ICD-10-CM | POA: Insufficient documentation

## 2019-08-10 DIAGNOSIS — C8580 Other specified types of non-Hodgkin lymphoma, unspecified site: Secondary | ICD-10-CM

## 2019-08-10 LAB — CBC WITH DIFFERENTIAL/PLATELET
Abs Immature Granulocytes: 0.06 10*3/uL (ref 0.00–0.07)
Basophils Absolute: 0.1 10*3/uL (ref 0.0–0.1)
Basophils Relative: 0 %
Eosinophils Absolute: 0.3 10*3/uL (ref 0.0–0.5)
Eosinophils Relative: 1 %
HCT: 35 % — ABNORMAL LOW (ref 36.0–46.0)
Hemoglobin: 11 g/dL — ABNORMAL LOW (ref 12.0–15.0)
Immature Granulocytes: 0 %
Lymphocytes Relative: 94 %
Lymphs Abs: 56.2 10*3/uL — ABNORMAL HIGH (ref 0.7–4.0)
MCH: 30.5 pg (ref 26.0–34.0)
MCHC: 31.4 g/dL (ref 30.0–36.0)
MCV: 97 fL (ref 80.0–100.0)
Monocytes Absolute: 0.3 10*3/uL (ref 0.1–1.0)
Monocytes Relative: 1 %
Neutro Abs: 2.5 10*3/uL (ref 1.7–7.7)
Neutrophils Relative %: 4 %
Platelets: 218 10*3/uL (ref 150–400)
RBC: 3.61 MIL/uL — ABNORMAL LOW (ref 3.87–5.11)
RDW: 16.5 % — ABNORMAL HIGH (ref 11.5–15.5)
WBC: 59.4 10*3/uL (ref 4.0–10.5)
nRBC: 0 % (ref 0.0–0.2)

## 2019-08-10 LAB — COMPREHENSIVE METABOLIC PANEL
ALT: 18 U/L (ref 0–44)
AST: 32 U/L (ref 15–41)
Albumin: 3.8 g/dL (ref 3.5–5.0)
Alkaline Phosphatase: 116 U/L (ref 38–126)
Anion gap: 7 (ref 5–15)
BUN: 12 mg/dL (ref 8–23)
CO2: 27 mmol/L (ref 22–32)
Calcium: 9.4 mg/dL (ref 8.9–10.3)
Chloride: 106 mmol/L (ref 98–111)
Creatinine, Ser: 0.78 mg/dL (ref 0.44–1.00)
GFR calc Af Amer: >60 mL/min (ref >60)
GFR calc non Af Amer: >60 mL/min (ref >60)
Glucose, Bld: 91 mg/dL (ref 70–99)
Potassium: 4.4 mmol/L (ref 3.5–5.1)
Sodium: 140 mmol/L (ref 135–145)
Total Bilirubin: 0.6 mg/dL (ref 0.3–1.2)
Total Protein: 6.4 g/dL — ABNORMAL LOW (ref 6.5–8.1)

## 2019-08-10 NOTE — Telephone Encounter (Signed)
CRITICAL VALUE STICKER  CRITICAL VALUE: WBC  59.4  RECEIVER Katheren Puller, RN   MD NOTIFIED: Dr. Jana Hakim

## 2019-08-11 ENCOUNTER — Encounter: Payer: Self-pay | Admitting: Oncology

## 2019-08-11 LAB — IGG, IGA, IGM
IgA: 115 mg/dL (ref 87–352)
IgG (Immunoglobin G), Serum: 914 mg/dL (ref 586–1602)
IgM (Immunoglobulin M), Srm: 26 mg/dL (ref 26–217)

## 2019-08-11 LAB — BETA 2 MICROGLOBULIN, SERUM: Beta-2 Microglobulin: 3.6 mg/L — ABNORMAL HIGH (ref 0.6–2.4)

## 2020-01-10 ENCOUNTER — Telehealth: Payer: Self-pay | Admitting: Oncology

## 2020-01-10 NOTE — Telephone Encounter (Signed)
R/s appt per 9/14 sch msg - left message for patient with appt date and time

## 2020-02-13 ENCOUNTER — Ambulatory Visit: Payer: BC Managed Care – PPO | Admitting: Oncology

## 2020-02-13 ENCOUNTER — Other Ambulatory Visit: Payer: BC Managed Care – PPO

## 2020-02-21 ENCOUNTER — Other Ambulatory Visit: Payer: Self-pay | Admitting: Obstetrics & Gynecology

## 2020-02-21 DIAGNOSIS — Z1231 Encounter for screening mammogram for malignant neoplasm of breast: Secondary | ICD-10-CM

## 2020-05-01 ENCOUNTER — Other Ambulatory Visit: Payer: Self-pay

## 2020-05-01 ENCOUNTER — Ambulatory Visit
Admission: RE | Admit: 2020-05-01 | Discharge: 2020-05-01 | Disposition: A | Discharge status: Home / Self Care | Payer: Medicare Other | Source: Ambulatory Visit | Attending: Obstetrics & Gynecology | Admitting: Obstetrics & Gynecology

## 2020-05-01 DIAGNOSIS — Z1231 Encounter for screening mammogram for malignant neoplasm of breast: Secondary | ICD-10-CM

## 2020-05-02 ENCOUNTER — Other Ambulatory Visit: Payer: Self-pay | Admitting: Obstetrics & Gynecology

## 2020-05-02 DIAGNOSIS — R928 Other abnormal and inconclusive findings on diagnostic imaging of breast: Secondary | ICD-10-CM

## 2020-05-14 NOTE — Progress Notes (Signed)
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 She continues under observation. She was last seen here on 02/09/2019.  Since her last visit, she underwent bilateral diagnostic mammography with tomography at [facility] on 05/01/2020 showing: breast density category B; possible left breast asymmetry.  She is scheduled for left diagnostic mammogram and left breast ultrasound on 05/16/2020.    REVIEW OF SYSTEMS: Dana Butler has gained a little weight.  She remains very active, exercising by walking and doing yoga.  She also helps take care of of their 23 and 76-year-old grandchildren who are in [place].  She has had no drenching sweats, no fevers, no rash, no pruritus and really no symptoms related to her lymphoma.   COVID 19 VACCINATION STATUS: [place] x2, status post booster October 2021   HISTORY OF PRESENT ILNESS: From the 07/20/2014 intake note:  Dana Butler had some "shooting pains across her neck" earliy in 2016, and saw ENT regarding this. There was a question of mild cervical adenopathy noted at that time. More recently, she underwent screening mammography 05/30/2014 at [facility]. This suggested enlarged lymph nodes in the left axilla and the patient was recalled for left axillary ultrasound 06/08/2014. There were 2 enlarged lymph nodes, one measuring 2.4 cm, retaining its fatty hilum, but with the cortex  measuring 4.5 mm. Posterior to that there was a second lymph node measuring 3.1 cm, with no internal fat. There was no mass noted in the upper outer quadrant of the left breast and review of the earlier mammogram showed no mammographic abnormality in either breast.  Biopsy of one of the left axillary lymph nodes 06/19/2014 showed ([id number]) a relatively monomorphic population of small round to slightly irregular lymphocytes, with dense chromatin and inconspicuous nucleoli. Flow cytometry ([id number]) showed a lambda restricted B-cell population expressing CD20, but not CD5, CD10 or cycling D1. Ki-67 was less than 10%. A preliminary diagnosis of B-cell non-Hodgkin's lymphoma, low-grade, was made..  On 06/29/2014 the patient underwent left axillary excisional biopsy with a total of 4 lymph nodes removed. The pathology from this more extensive sample showed ([id number]) effacement of the lymph node architecture by the lymphoma cells previously described, with only scattered larger lymphoid cells and histiocytes and no significant plasma cell component. There were scattered mitotic figures and no necrosis. Repeat flow cytometry showed most of the lymphocytes were CD20, CD23 and CD79a positive, with coexpression of CD43 but not CD5, CD10, BCL-6 or cyclin D1. The cells were BCL-2 positive. Overall it was pathology's impression that this was a low-grade B-cell non-Hodgkin's lymphoma, marginal zone type.  On 07/19/2014 Dana Butler underwent PET scanning which showed bilateral cervical adenopathy, bilateral axillary adenopathy, some small left common iliac lymph nodes and a normal spleen. There was very low metabolic activity in all the lymph nodes noted and similarly in the pelvic marrow suggesting possible marrow involvement.  The patient's subsequent history is as detailed below   PAST MEDICAL HISTORY: Past Medical History:  Diagnosis Date  .  Cancer ([place]) 2016   marginal zone non-hodgkins lymphoma  . Medical  history non-contributory     PAST SURGICAL HISTORY: Past Surgical History:  Procedure Laterality Date  . ABDOMINAL HYSTERECTOMY    . AXILLARY LYMPH NODE BIOPSY Left 06/29/2014   Procedure: AXILLARY LYMPH NODE BIOPSY;  Surgeon: Armandina Gemma, MD;  Location: [facility];  Service: General;  Laterality: Left;  . COLONOSCOPY    . DIAGNOSTIC LAPAROSCOPY  2005   BSO  . MASS EXCISION N/A 02/23/2019   Procedure: Excision of umbilicus skin lesion;  Surgeon: Wallace Going, DO;  Location: [facility];  Service: Plastics;  Laterality: N/A;    FAMILY HISTORY Family History  Problem Relation Age of Onset  . Breast cancer Paternal 19   The patient's father died at the age of 88, the patient's mother at age 23. The patient has 2 brothers, no sisters. Dana Butler's maternal grandmother had a diagnosis of leukemia, type unknown, in her 63s. A maternal cousin has a history of lymphoma.   GYNECOLOGIC HISTORY:  No LMP recorded. Patient has had a hysterectomy. Menarche age 88, first live birth age 36, the patient is GX P3. She underwent a hysterectomy with bilateral salpingo-oophorectomy at age 35. She continues on hormone replacement.   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 Her husband "Zan" just retired from anesthesia July 2015. (Incidentally he was the anesthesiologist during my prostatectomy 2004). Their children are currently age 41, 71, and 59.  As of October 2020 they have 2 grandchildren.  The patient attends [employer] [place] [employer]   HEALTH MAINTENANCE: Social History   Tobacco Use  . Smoking status: Never Smoker  . Smokeless tobacco: Never Used  Substance Use Topics  . Alcohol use: No  . Drug use: No     Colonoscopy:  PAP:  Bone density:  Lipid panel:  Allergies  Allergen Reactions  . Chloraprep One Step [Chlorhexidine Gluconate] Hives  . Latex Rash  . Tape Rash    Current  Outpatient Medications  Medication Sig Dispense Refill  . ALPRAZolam (XANAX) 0.5 MG tablet     . calcium carbonate (OS-CAL) 1250 (500 CA) MG chewable tablet Chew by mouth.    . Cholecalciferol (VITAMIN D3) 5000 UNITS TABS Take by mouth.    . estradiol (VIVELLE-DOT) 0.05 MG/24HR patch Place 1 patch onto the skin 2 (two) times a week.    [name] glucosamine-chondroitin 500-400 MG tablet Take by mouth.    [name] ibuprofen (ADVIL,MOTRIN) 200 MG tablet Take 200 mg by mouth.    . Multiple Vitamins-Minerals (MULTIVITAMIN WITH MINERALS) tablet Take 1 tablet by mouth daily.    . OMEGA-3 FATTY ACIDS-VITAMIN E PO 1,000 capsules. Take 2 capsules by mouth daily.    [name] triamcinolone (KENALOG) 0.025 % ointment Apply 1 application topically 2 (two) times daily. 30 g 0  . zaleplon (SONATA) 10 MG capsule Take 10 mg by mouth at bedtime.     No current facility-administered medications for this visit.    OBJECTIVE: White woman who appears younger than stated age  65:   05/15/20 0915  BP: 125/64  Pulse: 70  Resp: 18  Temp: (!) 97.2 F (36.2 C)  SpO2: 99%   Wt Readings from Last 3 Encounters:  05/15/20 122 lb 6.4 oz (55.5 kg)  03/03/19 115 lb 9.6 oz (52.4 kg)  02/23/19 114 lb 13.8 oz (52.1 kg)   Body mass index  is 22.39 kg/m.    ECOG FS:1 - Symptomatic but completely ambulatory  Sclerae unicteric, EOMs intact Wearing a mask No cervical or supraclavicular adenopathy Lungs no rales or rhonchi Heart regular rate and rhythm Abd soft, nontender, positive bowel sounds MSK no focal spinal tenderness, no upper extremity lymphedema Neuro: nonfocal, well oriented, appropriate affect Breasts: Deferred   LAB RESULTS:  CMP     Component Value Date/Time   NA 140 08/10/2019 1103   NA 140 02/10/2017 1352   K 4.4 08/10/2019 1103   K 3.8 02/10/2017 1352   CL 106 08/10/2019 1103   CO2 27 08/10/2019 1103   CO2 25 02/10/2017 1352   GLUCOSE 91 08/10/2019 1103   GLUCOSE 94 02/10/2017 1352   BUN 12  08/10/2019 1103   BUN 17.0 02/10/2017 1352   CREATININE 0.78 08/10/2019 1103   CREATININE 0.79 08/04/2017 1242   CREATININE 0.8 02/10/2017 1352   CALCIUM 9.4 08/10/2019 1103   CALCIUM 9.5 02/10/2017 1352   PROT 6.4 (L) 08/10/2019 1103   PROT 6.9 02/10/2017 1352   ALBUMIN 3.8 08/10/2019 1103   ALBUMIN 4.0 02/10/2017 1352   AST 32 08/10/2019 1103   AST 32 08/04/2017 1242   AST 30 02/10/2017 1352   ALT 18 08/10/2019 1103   ALT 26 08/04/2017 1242   ALT 21 02/10/2017 1352   ALKPHOS 116 08/10/2019 1103   ALKPHOS 118 02/10/2017 1352   BILITOT 0.6 08/10/2019 1103   BILITOT 0.5 08/04/2017 1242   BILITOT 0.44 02/10/2017 1352   GFRNONAA >60 08/10/2019 1103   GFRNONAA >60 08/04/2017 1242   GFRAA >60 08/10/2019 1103   GFRAA >60 08/04/2017 1242    INo results found for: SPEP, UPEP  Lab Results  Component Value Date   WBC 77.9 (HH) 05/15/2020   NEUTROABS 2.5 05/15/2020   HGB 11.1 (L) 05/15/2020   HCT 35.2 (L) 05/15/2020   MCV 94.9 05/15/2020   PLT 231 05/15/2020      Chemistry      Component Value Date/Time   NA 140 08/10/2019 1103   NA 140 02/10/2017 1352   K 4.4 08/10/2019 1103   K 3.8 02/10/2017 1352   CL 106 08/10/2019 1103   CO2 27 08/10/2019 1103   CO2 25 02/10/2017 1352   BUN 12 08/10/2019 1103   BUN 17.0 02/10/2017 1352   CREATININE 0.78 08/10/2019 1103   CREATININE 0.79 08/04/2017 1242   CREATININE 0.8 02/10/2017 1352      Component Value Date/Time   CALCIUM 9.4 08/10/2019 1103   CALCIUM 9.5 02/10/2017 1352   ALKPHOS 116 08/10/2019 1103   ALKPHOS 118 02/10/2017 1352   AST 32 08/10/2019 1103   AST 32 08/04/2017 1242   AST 30 02/10/2017 1352   ALT 18 08/10/2019 1103   ALT 26 08/04/2017 1242   ALT 21 02/10/2017 1352   BILITOT 0.6 08/10/2019 1103   BILITOT 0.5 08/04/2017 1242   BILITOT 0.44 02/10/2017 1352      No results found for: LABCA2  No components found for: LABCA125  No results for input(s): INR in the last 168 hours.  Urinalysis No  results found for: COLORURINE, APPEARANCEUR, LABSPEC, PHURINE, GLUCOSEU, HGBUR, BILIRUBINUR, KETONESUR, PROTEINUR, UROBILINOGEN, NITRITE, LEUKOCYTESUR   STUDIES: MM 3D SCREEN BREAST BILATERAL  Result Date: 05/01/2020 CLINICAL DATA:  Screening. EXAM: DIGITAL SCREENING BILATERAL MAMMOGRAM WITH TOMO AND CAD COMPARISON:  Previous exam(s). ACR Breast Density Category b: There are scattered areas of fibroglandular density. FINDINGS: In the left breast, a possible asymmetry  warrants further evaluation. In the right breast, no findings suspicious for malignancy. Images were processed with CAD. IMPRESSION: Further evaluation is suggested for possible asymmetry in the left breast. RECOMMENDATION: Diagnostic mammogram and possibly ultrasound of the left breast. (Code:[id number]) The patient will be contacted regarding the findings, and additional imaging will be scheduled. BI-RADS CATEGORY  0: Incomplete. Need additional imaging evaluation and/or prior mammograms for comparison. Electronically Signed   By: Dorise Bullion III M.D   On: 05/01/2020 16:27     ASSESSMENT: 65 y.o.  woman s/p left axillary lymph node biopsy 06/29/2014 for a B-cell non-Hodgkin's lymphoma, consistent with marginal zone nodal lymphoma, advanced stage  (a) biopsy of a right breast mass 04/12/2018 showed 89% of the cells to be lambda restricted, with immunophenotype consistent with marginal zone non-Hodgkin's lymphoma (CD19, 20 and 23+  (1) signed up for [facility] 08/29/2014  (2) consider rituximab or ibrutinib if symptoms or cytopenias develop  (a)  serology at [facility] 08/29/2014 was negative for hepatitis C antibody, hepatitis B core antibody, hepatitis B surface antigen, hepatitis B surface antibody, or HIV antigen or antibody  (b) status post zoster vaccine 2019   PLAN: Heba is now just about 6 years out from initial diagnosis of her indolent non-Hodgkin's lymphoma.  She has not required treatment to date.  We reviewed her  numbers.  The hemoglobin specifically is essentially unchanged from about a year ago.  I am obtaining additional lab work today including ferritin, B12, and folate just to make sure we do not have a secondary reason for the mild anemia.  The most likely reason of course is simply the lymphoma itself.  It takes up room in the bone marrow and it also contributes to "anemia of chronic illness".  If her hemoglobin drops below 11 I think we would consider starting ibrutinib.  She is concerned about the cost of this medication.  I gave her information regarding it and reassured her that we do have an oral chemotherapy pharmacy specialty group that helps patients obtain the drug at a reasonable cost and if not then we will do something different.  We certainly do not like for options.  She was concerned that the mammographers might want to rebiopsy her axillary lymph nodes.  I reassured her what they are concerned about is an apparent area of distortion in the left breast.  There is no need to biopsy the lymph nodes if there is nothing in the breast to biopsy.  She requested a vitamin D level and I was glad to get that for her  She will return to see me in 6 months.  We will obtain labs a few days before that visit.  Total encounter time 25 minutes.*  Joud Ingwersen, Virgie Dad, MD  05/15/20 9:33 AM Medical Oncology and Hematology [facility] [place], [place] [address] Tel. [phone]    Fax. [phone]   I, Wilburn Mylar, am acting as scribe for Dr. Virgie Dad. Nellene Courtois.  I, Lurline Del MD, have reviewed the above documentation for accuracy and completeness, and I agree with the above.    *Total Encounter Time as defined by the Centers for Medicare and Medicaid Services includes, in addition to the face-to-face time of a patient visit (documented in the note above) non-face-to-face time: obtaining and reviewing outside history, ordering and reviewing medications,  tests or procedures, care coordination (communications with other health care professionals or caregivers) and documentation in the medical record.

## 2020-05-15 ENCOUNTER — Inpatient Hospital Stay (HOSPITAL_BASED_OUTPATIENT_CLINIC_OR_DEPARTMENT_OTHER): Payer: Medicare Other | Admitting: Oncology

## 2020-05-15 ENCOUNTER — Telehealth: Payer: Self-pay | Admitting: *Deleted

## 2020-05-15 ENCOUNTER — Inpatient Hospital Stay: Payer: Medicare Other | Attending: Oncology

## 2020-05-15 ENCOUNTER — Other Ambulatory Visit: Payer: Self-pay

## 2020-05-15 VITALS — BP 125/64 | HR 70 | Temp 97.2°F | Resp 18 | Ht 62.0 in | Wt 122.4 lb

## 2020-05-15 DIAGNOSIS — C8580 Other specified types of non-Hodgkin lymphoma, unspecified site: Secondary | ICD-10-CM

## 2020-05-15 DIAGNOSIS — M8580 Other specified disorders of bone density and structure, unspecified site: Secondary | ICD-10-CM

## 2020-05-15 DIAGNOSIS — D649 Anemia, unspecified: Secondary | ICD-10-CM | POA: Diagnosis not present

## 2020-05-15 DIAGNOSIS — Z8572 Personal history of non-Hodgkin lymphomas: Secondary | ICD-10-CM | POA: Insufficient documentation

## 2020-05-15 LAB — COMPREHENSIVE METABOLIC PANEL
ALT: 20 U/L (ref 0–44)
AST: 33 U/L (ref 15–41)
Albumin: 3.7 g/dL (ref 3.5–5.0)
Alkaline Phosphatase: 142 U/L — ABNORMAL HIGH (ref 38–126)
Anion gap: 5 (ref 5–15)
BUN: 14 mg/dL (ref 8–23)
CO2: 26 mmol/L (ref 22–32)
Calcium: 9 mg/dL (ref 8.9–10.3)
Chloride: 109 mmol/L (ref 98–111)
Creatinine, Ser: 0.78 mg/dL (ref 0.44–1.00)
GFR, Estimated: >60 mL/min (ref >60)
Glucose, Bld: 93 mg/dL (ref 70–99)
Potassium: 3.9 mmol/L (ref 3.5–5.1)
Sodium: 140 mmol/L (ref 135–145)
Total Bilirubin: 0.4 mg/dL (ref 0.3–1.2)
Total Protein: 6.1 g/dL — ABNORMAL LOW (ref 6.5–8.1)

## 2020-05-15 LAB — CBC WITH DIFFERENTIAL/PLATELET
Abs Immature Granulocytes: 0.09 10*3/uL — ABNORMAL HIGH (ref 0.00–0.07)
Basophils Absolute: 0.1 10*3/uL (ref 0.0–0.1)
Basophils Relative: 0 %
Eosinophils Absolute: 0.5 10*3/uL (ref 0.0–0.5)
Eosinophils Relative: 1 %
HCT: 35.2 % — ABNORMAL LOW (ref 36.0–46.0)
Hemoglobin: 11.1 g/dL — ABNORMAL LOW (ref 12.0–15.0)
Immature Granulocytes: 0 %
Lymphocytes Relative: 95 %
Lymphs Abs: 73.7 10*3/uL — ABNORMAL HIGH (ref 0.7–4.0)
MCH: 29.9 pg (ref 26.0–34.0)
MCHC: 31.5 g/dL (ref 30.0–36.0)
MCV: 94.9 fL (ref 80.0–100.0)
Monocytes Absolute: 1.1 10*3/uL — ABNORMAL HIGH (ref 0.1–1.0)
Monocytes Relative: 1 %
Neutro Abs: 2.5 10*3/uL (ref 1.7–7.7)
Neutrophils Relative %: 3 %
Platelets: 231 10*3/uL (ref 150–400)
RBC: 3.71 MIL/uL — ABNORMAL LOW (ref 3.87–5.11)
RDW: 16.6 % — ABNORMAL HIGH (ref 11.5–15.5)
WBC: 77.9 10*3/uL (ref 4.0–10.5)
nRBC: 0 % (ref 0.0–0.2)

## 2020-05-15 LAB — RETICULOCYTES
Immature Retic Fract: 16.1 % — ABNORMAL HIGH (ref 2.3–15.9)
RBC.: 3.72 MIL/uL — ABNORMAL LOW (ref 3.87–5.11)
Retic Count, Absolute: 51 10*3/uL (ref 19.0–186.0)
Retic Ct Pct: 1.4 % (ref 0.4–3.1)

## 2020-05-15 LAB — FOLATE: Folate: 16.9 ng/mL (ref >5.9)

## 2020-05-15 LAB — VITAMIN B12: Vitamin B-12: 319 pg/mL (ref 180–914)

## 2020-05-15 LAB — SAVE SMEAR(SSMR), FOR PROVIDER SLIDE REVIEW

## 2020-05-15 LAB — FERRITIN: Ferritin: 19 ng/mL (ref 11–307)

## 2020-05-15 NOTE — Telephone Encounter (Signed)
Received critical lab report from [facility] lab/Pam with WBC 77.9. Reported to Dr Jana Hakim who is seeing pt today.

## 2020-05-16 ENCOUNTER — Ambulatory Visit
Admission: RE | Admit: 2020-05-16 | Discharge: 2020-05-16 | Disposition: A | Discharge status: Home / Self Care | Payer: Medicare Other | Source: Ambulatory Visit | Attending: Obstetrics & Gynecology | Admitting: Obstetrics & Gynecology

## 2020-05-16 ENCOUNTER — Ambulatory Visit: Payer: 59

## 2020-05-16 DIAGNOSIS — R928 Other abnormal and inconclusive findings on diagnostic imaging of breast: Secondary | ICD-10-CM

## 2020-05-16 LAB — BETA 2 MICROGLOBULIN, SERUM: Beta-2 Microglobulin: 2.9 mg/L — ABNORMAL HIGH (ref 0.6–2.4)

## 2020-05-16 LAB — IGG, IGA, IGM
IgA: 124 mg/dL (ref 87–352)
IgG (Immunoglobin G), Serum: 783 mg/dL (ref 586–1602)
IgM (Immunoglobulin M), Srm: 25 mg/dL — ABNORMAL LOW (ref 26–217)

## 2020-05-17 ENCOUNTER — Other Ambulatory Visit: Payer: Self-pay | Admitting: Oncology

## 2020-05-17 ENCOUNTER — Telehealth: Payer: Self-pay | Admitting: Oncology

## 2020-05-17 DIAGNOSIS — C8580 Other specified types of non-Hodgkin lymphoma, unspecified site: Secondary | ICD-10-CM

## 2020-05-17 DIAGNOSIS — D508 Other iron deficiency anemias: Secondary | ICD-10-CM

## 2020-05-17 NOTE — Telephone Encounter (Signed)
Scheduled appts per 1/19 los. Pt confirmed appt dates and times.  

## 2020-05-17 NOTE — Progress Notes (Signed)
I called Dana Butler and let her know that her ferritin was on the low side.  I think it would hurt her to take a little iron daily.  She will give that a try.  We will recheck it when she returns next time

## 2020-09-08 ENCOUNTER — Other Ambulatory Visit: Payer: Self-pay | Admitting: Oncology

## 2020-09-25 ENCOUNTER — Telehealth: Payer: Self-pay | Admitting: Oncology

## 2020-09-25 NOTE — Telephone Encounter (Signed)
Scheduled appointment per provider. Left a detailed message.

## 2020-11-12 ENCOUNTER — Other Ambulatory Visit: Payer: 59

## 2020-11-13 ENCOUNTER — Other Ambulatory Visit: Payer: Self-pay | Admitting: *Deleted

## 2020-11-13 DIAGNOSIS — M8580 Other specified disorders of bone density and structure, unspecified site: Secondary | ICD-10-CM

## 2020-11-13 DIAGNOSIS — C8580 Other specified types of non-Hodgkin lymphoma, unspecified site: Secondary | ICD-10-CM

## 2020-11-14 ENCOUNTER — Ambulatory Visit: Payer: 59 | Admitting: Oncology

## 2020-11-15 ENCOUNTER — Inpatient Hospital Stay: Payer: Medicare Other | Attending: Oncology

## 2020-11-15 ENCOUNTER — Other Ambulatory Visit: Payer: Self-pay

## 2020-11-15 DIAGNOSIS — Z8572 Personal history of non-Hodgkin lymphomas: Secondary | ICD-10-CM | POA: Diagnosis present

## 2020-11-15 DIAGNOSIS — D649 Anemia, unspecified: Secondary | ICD-10-CM | POA: Insufficient documentation

## 2020-11-15 DIAGNOSIS — M8580 Other specified disorders of bone density and structure, unspecified site: Secondary | ICD-10-CM

## 2020-11-15 DIAGNOSIS — C8580 Other specified types of non-Hodgkin lymphoma, unspecified site: Secondary | ICD-10-CM

## 2020-11-15 DIAGNOSIS — D508 Other iron deficiency anemias: Secondary | ICD-10-CM

## 2020-11-15 LAB — CBC WITH DIFFERENTIAL/PLATELET
Abs Immature Granulocytes: 0.11 10*3/uL — ABNORMAL HIGH (ref 0.00–0.07)
Basophils Absolute: 0.1 10*3/uL (ref 0.0–0.1)
Basophils Relative: 0 %
Eosinophils Absolute: 0.4 10*3/uL (ref 0.0–0.5)
Eosinophils Relative: 1 %
HCT: 34.6 % — ABNORMAL LOW (ref 36.0–46.0)
Hemoglobin: 11.3 g/dL — ABNORMAL LOW (ref 12.0–15.0)
Immature Granulocytes: 0 %
Lymphocytes Relative: 92 %
Lymphs Abs: 59.1 10*3/uL — ABNORMAL HIGH (ref 0.7–4.0)
MCH: 31.2 pg (ref 26.0–34.0)
MCHC: 32.7 g/dL (ref 30.0–36.0)
MCV: 95.6 fL (ref 80.0–100.0)
Monocytes Absolute: 0.3 10*3/uL (ref 0.1–1.0)
Monocytes Relative: 1 %
Neutro Abs: 3.8 10*3/uL (ref 1.7–7.7)
Neutrophils Relative %: 6 %
Platelets: 219 10*3/uL (ref 150–400)
RBC: 3.62 MIL/uL — ABNORMAL LOW (ref 3.87–5.11)
RDW: 16.5 % — ABNORMAL HIGH (ref 11.5–15.5)
WBC: 63.8 10*3/uL (ref 4.0–10.5)
nRBC: 0 % (ref 0.0–0.2)

## 2020-11-15 LAB — COMPREHENSIVE METABOLIC PANEL
ALT: 21 U/L (ref 0–44)
AST: 33 U/L (ref 15–41)
Albumin: 3.9 g/dL (ref 3.5–5.0)
Alkaline Phosphatase: 157 U/L — ABNORMAL HIGH (ref 38–126)
Anion gap: 8 (ref 5–15)
BUN: 18 mg/dL (ref 8–23)
CO2: 27 mmol/L (ref 22–32)
Calcium: 9.5 mg/dL (ref 8.9–10.3)
Chloride: 106 mmol/L (ref 98–111)
Creatinine, Ser: 0.79 mg/dL (ref 0.44–1.00)
GFR, Estimated: >60 mL/min (ref >60)
Glucose, Bld: 91 mg/dL (ref 70–99)
Potassium: 4.3 mmol/L (ref 3.5–5.1)
Sodium: 141 mmol/L (ref 135–145)
Total Bilirubin: 0.5 mg/dL (ref 0.3–1.2)
Total Protein: 6.5 g/dL (ref 6.5–8.1)

## 2020-11-15 LAB — FERRITIN: Ferritin: 34 ng/mL (ref 11–307)

## 2020-11-15 LAB — VITAMIN D 25 HYDROXY (VIT D DEFICIENCY, FRACTURES): Vit D, 25-Hydroxy: 52.74 ng/mL (ref 30–100)

## 2020-11-15 LAB — IRON AND TIBC
Iron: 66 ug/dL (ref 41–142)
Saturation Ratios: 24 % (ref 21–57)
TIBC: 279 ug/dL (ref 236–444)
UIBC: 213 ug/dL (ref 120–384)

## 2020-11-16 LAB — IGG, IGA, IGM
IgA: 110 mg/dL (ref 87–352)
IgG (Immunoglobin G), Serum: 754 mg/dL (ref 586–1602)
IgM (Immunoglobulin M), Srm: 24 mg/dL — ABNORMAL LOW (ref 26–217)

## 2020-11-16 LAB — BETA 2 MICROGLOBULIN, SERUM: Beta-2 Microglobulin: 3.7 mg/L — ABNORMAL HIGH (ref 0.6–2.4)

## 2020-11-17 NOTE — Progress Notes (Signed)
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 She continues under observation.   The interval history is generally unremarkable.  She feels "good", has plenty of energy, has had no drenching sweats or fever, no intercurrent infections, and no unexplained weight loss.  She has noted a slight increase particularly in the left inguinal adenopathy and she wanted me to check on that today.   REVIEW OF SYSTEMS: Detailed review of systems today was otherwise noncontributory   COVID 19 VACCINATION STATUS: Pfizer x2, status post booster October 2021   HISTORY OF PRESENT ILNESS: From the 07/20/2014 intake note:  Darshana had some "shooting pains across her neck" earliy in 2016, and saw ENT regarding this. There was a question of mild cervical adenopathy noted at that time. More recently, she underwent screening mammography 05/30/2014 at the [facility]. This suggested enlarged lymph nodes in the left axilla and the patient was recalled for left axillary ultrasound 06/08/2014. There were 2 enlarged lymph nodes, one measuring 2.4 cm, retaining its fatty hilum, but with the cortex measuring 4.5 mm. Posterior to that there was a second lymph node measuring 3.1 cm, with no internal fat. There was no mass noted in the upper outer quadrant of the left breast and review of the earlier mammogram showed no mammographic abnormality in  either breast.  Biopsy of one of the left axillary lymph nodes 06/19/2014 showed ([id number]) a relatively monomorphic population of small round to slightly irregular lymphocytes, with dense chromatin and inconspicuous nucleoli. Flow cytometry ([id number]) showed a lambda restricted B-cell population expressing CD20, but not CD5, CD10 or cycling D1. Ki-67 was less than 10%. A preliminary diagnosis of B-cell non-Hodgkin's lymphoma, low-grade, was made..  On 06/29/2014 the patient underwent left axillary excisional biopsy with a total of 4 lymph nodes removed. The pathology from this more extensive sample showed ([id number]) effacement of the lymph node architecture by the lymphoma cells previously described, with only scattered larger lymphoid cells and histiocytes and no significant plasma cell component. There were scattered mitotic figures and no necrosis. Repeat flow cytometry showed most of the lymphocytes were CD20, CD23 and CD79a positive, with coexpression of CD43 but not CD5, CD10, BCL-6 or cyclin D1. The cells were BCL-2 positive. Overall it was pathology's impression that this was a low-grade B-cell non-Hodgkin's lymphoma, marginal zone type.  On 07/19/2014 [facility] underwent PET scanning which showed bilateral cervical adenopathy, bilateral axillary adenopathy, some small left common iliac lymph nodes and a normal spleen. There was very low metabolic activity in all the lymph nodes noted and similarly in the pelvic marrow suggesting possible marrow involvement.  The patient's subsequent history is as detailed below   PAST MEDICAL HISTORY: Past Medical History:  Diagnosis Date   Cancer ([place]) 2016   marginal zone non-hodgkins lymphoma   Medical history non-contributory     PAST SURGICAL HISTORY: Past Surgical History:  Procedure Laterality Date   ABDOMINAL HYSTERECTOMY     AXILLARY LYMPH NODE BIOPSY Left 06/29/2014  Procedure: AXILLARY LYMPH NODE BIOPSY;  Surgeon: Armandina Gemma, MD;   Location: [facility];  Service: General;  Laterality: Left;   COLONOSCOPY     DIAGNOSTIC LAPAROSCOPY  2005   BSO   MASS EXCISION N/A 02/23/2019   Procedure: Excision of umbilicus skin lesion;  Surgeon: Wallace Going, DO;  Location: [facility];  Service: Plastics;  Laterality: N/A;    FAMILY HISTORY Family History  Problem Relation Age of Onset   Breast cancer Paternal 13   The patient's father died at the age of 82, the patient's mother at age 67. The patient has 2 brothers, no sisters. Eyla's maternal grandmother had a diagnosis of leukemia, type unknown, in her 30s. A maternal cousin has a history of lymphoma.   GYNECOLOGIC HISTORY:  No LMP recorded. Patient has had a hysterectomy. Menarche age 6, first live birth age 65, the patient is 65X P3. She underwent a hysterectomy with bilateral salpingo-oophorectomy at age 65. She continues on hormone replacement.   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 Her husband "Zan" just retired from anesthesia July 2015. (Incidentally he was the anesthesiologist during my prostatectomy 2004). Their children are currently age 65, 26 and 88.  The patient has 2 grandchildren.  The patient attends [employer] [place] [facility]   HEALTH MAINTENANCE: Social History   Tobacco Use   Smoking status: Never   Smokeless tobacco: Never  Substance Use Topics   Alcohol use: No   Drug use: No     Colonoscopy:  PAP:  Bone density:  Lipid panel:  Allergies  Allergen Reactions   Chloraprep One Step [Chlorhexidine Gluconate] Hives   Latex Rash   Tape Rash    Current Outpatient Medications  Medication Sig Dispense Refill   ALPRAZolam (XANAX) 0.5 MG tablet      calcium carbonate (OS-CAL) 1250 (500 CA) MG chewable tablet Chew by mouth.     Cholecalciferol (VITAMIN D3) 5000 UNITS TABS Take by mouth.     estradiol (VIVELLE-DOT)  0.05 MG/24HR patch Place 1 patch onto the skin 2 (two) times a week.     glucosamine-chondroitin 500-400 MG tablet Take by mouth.     ibuprofen (ADVIL,MOTRIN) 200 MG tablet Take 200 mg by mouth.     Multiple Vitamins-Minerals (MULTIVITAMIN WITH MINERALS) tablet Take 1 tablet by mouth daily.     OMEGA-3 FATTY ACIDS-VITAMIN E PO 1,000 capsules. Take 2 capsules by mouth daily.     triamcinolone (KENALOG) 0.025 % ointment Apply 1 application topically 2 (two) times daily. 30 g 0   zaleplon (SONATA) 10 MG capsule Take 10 mg by mouth at bedtime.     No current facility-administered medications for this visit.    OBJECTIVE: White woman who appears younger than stated age  10:   11/18/20 1402  BP: 115/62  Pulse: 76  Resp: 18  Temp: 97.7 F (36.5 C)  SpO2: 98%    Wt Readings from Last 3 Encounters:  11/18/20 119 lb (54 kg)  05/15/20 122 lb 6.4 oz (55.5 kg)  03/03/19 115 lb 9.6 oz (52.4 kg)   Body mass index is 21.77 kg/m.    ECOG FS:1 - Symptomatic but completely ambulatory  Sclerae unicteric, EOMs intact Wearing a mask There is small bilateral cervical adenopathy.  There is bilateral axillary adenopathy, easier to feel on the left side, the lymph nodes being about an inch, movable, and  rubbery.  There is also bilateral inguinal adenopathy, left greater than right, the left inguinal lymph node measuring approximately 1-1/2 cm, movable, and nontender. Lungs no rales or rhonchi Heart regular rate and rhythm Abd soft, nontender, positive bowel sounds MSK no focal spinal tenderness, no upper extremity lymphedema Neuro: nonfocal, well oriented, appropriate affect Breasts: No masses palpated.  No skin or nipple changes of concern   LAB RESULTS:  CMP     Component Value Date/Time   NA 141 11/15/2020 1411   NA 140 02/10/2017 1352   K 4.3 11/15/2020 1411   K 3.8 02/10/2017 1352   CL 106 11/15/2020 1411   CO2 27 11/15/2020 1411   CO2 25 02/10/2017 1352   GLUCOSE 91 11/15/2020  1411   GLUCOSE 94 02/10/2017 1352   BUN 18 11/15/2020 1411   BUN 17.0 02/10/2017 1352   CREATININE 0.79 11/15/2020 1411   CREATININE 0.79 08/04/2017 1242   CREATININE 0.8 02/10/2017 1352   CALCIUM 9.5 11/15/2020 1411   CALCIUM 9.5 02/10/2017 1352   PROT 6.5 11/15/2020 1411   PROT 6.9 02/10/2017 1352   ALBUMIN 3.9 11/15/2020 1411   ALBUMIN 4.0 02/10/2017 1352   AST 33 11/15/2020 1411   AST 32 08/04/2017 1242   AST 30 02/10/2017 1352   ALT 21 11/15/2020 1411   ALT 26 08/04/2017 1242   ALT 21 02/10/2017 1352   ALKPHOS 157 (H) 11/15/2020 1411   ALKPHOS 118 02/10/2017 1352   BILITOT 0.5 11/15/2020 1411   BILITOT 0.5 08/04/2017 1242   BILITOT 0.44 02/10/2017 1352   GFRNONAA >60 11/15/2020 1411   GFRNONAA >60 08/04/2017 1242   GFRAA >60 08/10/2019 1103   GFRAA >60 08/04/2017 1242    INo results found for: SPEP, UPEP  Lab Results  Component Value Date   WBC 63.8 (HH) 11/15/2020   NEUTROABS 3.8 11/15/2020   HGB 11.3 (L) 11/15/2020   HCT 34.6 (L) 11/15/2020   MCV 95.6 11/15/2020   PLT 219 11/15/2020      Chemistry      Component Value Date/Time   NA 141 11/15/2020 1411   NA 140 02/10/2017 1352   K 4.3 11/15/2020 1411   K 3.8 02/10/2017 1352   CL 106 11/15/2020 1411   CO2 27 11/15/2020 1411   CO2 25 02/10/2017 1352   BUN 18 11/15/2020 1411   BUN 17.0 02/10/2017 1352   CREATININE 0.79 11/15/2020 1411   CREATININE 0.79 08/04/2017 1242   CREATININE 0.8 02/10/2017 1352      Component Value Date/Time   CALCIUM 9.5 11/15/2020 1411   CALCIUM 9.5 02/10/2017 1352   ALKPHOS 157 (H) 11/15/2020 1411   ALKPHOS 118 02/10/2017 1352   AST 33 11/15/2020 1411   AST 32 08/04/2017 1242   AST 30 02/10/2017 1352   ALT 21 11/15/2020 1411   ALT 26 08/04/2017 1242   ALT 21 02/10/2017 1352   BILITOT 0.5 11/15/2020 1411   BILITOT 0.5 08/04/2017 1242   BILITOT 0.44 02/10/2017 1352      No results found for: LABCA2  No components found for: LABCA125  No results for input(s):  INR in the last 168 hours.  Urinalysis No results found for: COLORURINE, APPEARANCEUR, LABSPEC, PHURINE, GLUCOSEU, HGBUR, BILIRUBINUR, KETONESUR, PROTEINUR, UROBILINOGEN, NITRITE, LEUKOCYTESUR   STUDIES: No results found.   ASSESSMENT: 65 y.o. [place] woman s/p left axillary lymph node biopsy 06/29/2014 for a B-cell non-Hodgkin's lymphoma, consistent with marginal zone nodal lymphoma, advanced stage  (a) biopsy of a right breast  mass 04/12/2018 showed 89% of the cells to be lambda restricted, with immunophenotype consistent with marginal zone non-Hodgkin's lymphoma (CD19, 20 and 23+  (1) signed up for [facility] 08/29/2014  (2) consider rituximab or ibrutinib if symptoms or cytopenias develop  (a)  serology at [facility] 08/29/2014 was negative for hepatitis C antibody, hepatitis B core antibody, hepatitis B surface antigen, hepatitis B surface antibody, or HIV antigen or antibody  (b) status post zoster vaccine 2019   PLAN: Liana is now a little over 6 years out from initial diagnosis of her non-Hodgkin's lymphoma.  She has not required treatment to date.  We reviewed her lab work in detail.  The absolute lymphocyte count has been up and down and is actually down currently.  She has very good immunoglobulin levels.  She has palpable adenopathy as described in the physical exam but this is not causing her any symptoms.  She has minimal anemia which is stable.  At this point then I do not have any hard triggers to initiate treatment  We are going to increase the iron to see if that helps the hemoglobin further.  She is tolerating it so far without any issues.  She will see me in December after her next set of lab work.  She knows to call for any other issues that may develop before then  Total encounter time 25 minutes.*   Pavel Gadd, Virgie Dad, MD  11/18/20 2:37 PM Medical Oncology and Hematology [facility] [place], [place] [address] Tel. [phone]    Fax.  [phone]   I, Wilburn Mylar, am acting as scribe for Dr. Virgie Dad. Cotey Rakes.  I, Lurline Del MD, have reviewed the above documentation for accuracy and completeness, and I agree with the above.   *Total Encounter Time as defined by the Centers for Medicare and Medicaid Services includes, in addition to the face-to-face time of a patient visit (documented in the note above) non-face-to-face time: obtaining and reviewing outside history, ordering and reviewing medications, tests or procedures, care coordination (communications with other health care professionals or caregivers) and documentation in the medical record.

## 2020-11-18 ENCOUNTER — Inpatient Hospital Stay (HOSPITAL_BASED_OUTPATIENT_CLINIC_OR_DEPARTMENT_OTHER): Payer: Medicare Other | Admitting: Oncology

## 2020-11-18 ENCOUNTER — Other Ambulatory Visit: Payer: Self-pay

## 2020-11-18 VITALS — BP 115/62 | HR 76 | Temp 97.7°F | Resp 18 | Ht 62.0 in | Wt 119.0 lb

## 2020-11-18 DIAGNOSIS — Z8572 Personal history of non-Hodgkin lymphomas: Secondary | ICD-10-CM | POA: Diagnosis not present

## 2020-11-18 DIAGNOSIS — C8580 Other specified types of non-Hodgkin lymphoma, unspecified site: Secondary | ICD-10-CM | POA: Diagnosis not present

## 2021-03-27 ENCOUNTER — Telehealth: Payer: Self-pay

## 2021-03-27 ENCOUNTER — Inpatient Hospital Stay: Payer: Medicare Other | Attending: Oncology

## 2021-03-27 ENCOUNTER — Other Ambulatory Visit: Payer: Self-pay

## 2021-03-27 DIAGNOSIS — Z8572 Personal history of non-Hodgkin lymphomas: Secondary | ICD-10-CM | POA: Insufficient documentation

## 2021-03-27 DIAGNOSIS — M25471 Effusion, right ankle: Secondary | ICD-10-CM | POA: Insufficient documentation

## 2021-03-27 DIAGNOSIS — Z803 Family history of malignant neoplasm of breast: Secondary | ICD-10-CM | POA: Diagnosis not present

## 2021-03-27 DIAGNOSIS — D649 Anemia, unspecified: Secondary | ICD-10-CM | POA: Diagnosis not present

## 2021-03-27 DIAGNOSIS — C8580 Other specified types of non-Hodgkin lymphoma, unspecified site: Secondary | ICD-10-CM

## 2021-03-27 DIAGNOSIS — Z90722 Acquired absence of ovaries, bilateral: Secondary | ICD-10-CM | POA: Insufficient documentation

## 2021-03-27 DIAGNOSIS — M25472 Effusion, left ankle: Secondary | ICD-10-CM | POA: Diagnosis not present

## 2021-03-27 DIAGNOSIS — M8580 Other specified disorders of bone density and structure, unspecified site: Secondary | ICD-10-CM

## 2021-03-27 LAB — CBC WITH DIFFERENTIAL/PLATELET
Abs Immature Granulocytes: 0.1 10*3/uL — ABNORMAL HIGH (ref 0.00–0.07)
Basophils Absolute: 0.1 10*3/uL (ref 0.0–0.1)
Basophils Relative: 0 %
Eosinophils Absolute: 0.4 10*3/uL (ref 0.0–0.5)
Eosinophils Relative: 1 %
HCT: 34.4 % — ABNORMAL LOW (ref 36.0–46.0)
Hemoglobin: 11 g/dL — ABNORMAL LOW (ref 12.0–15.0)
Immature Granulocytes: 0 %
Lymphocytes Relative: 95 %
Lymphs Abs: 83.2 10*3/uL — ABNORMAL HIGH (ref 0.7–4.0)
MCH: 30.6 pg (ref 26.0–34.0)
MCHC: 32 g/dL (ref 30.0–36.0)
MCV: 95.8 fL (ref 80.0–100.0)
Monocytes Absolute: 0.5 10*3/uL (ref 0.1–1.0)
Monocytes Relative: 1 %
Neutro Abs: 3 10*3/uL (ref 1.7–7.7)
Neutrophils Relative %: 3 %
Platelets: 214 10*3/uL (ref 150–400)
RBC: 3.59 MIL/uL — ABNORMAL LOW (ref 3.87–5.11)
RDW: 16.8 % — ABNORMAL HIGH (ref 11.5–15.5)
Smear Review: NORMAL
WBC: 87.3 10*3/uL (ref 4.0–10.5)
nRBC: 0 % (ref 0.0–0.2)

## 2021-03-27 LAB — COMPREHENSIVE METABOLIC PANEL
ALT: 17 U/L (ref 0–44)
AST: 30 U/L (ref 15–41)
Albumin: 3.9 g/dL (ref 3.5–5.0)
Alkaline Phosphatase: 185 U/L — ABNORMAL HIGH (ref 38–126)
Anion gap: 9 (ref 5–15)
BUN: 11 mg/dL (ref 8–23)
CO2: 24 mmol/L (ref 22–32)
Calcium: 9.2 mg/dL (ref 8.9–10.3)
Chloride: 108 mmol/L (ref 98–111)
Creatinine, Ser: 0.82 mg/dL (ref 0.44–1.00)
GFR, Estimated: >60 mL/min (ref >60)
Glucose, Bld: 100 mg/dL — ABNORMAL HIGH (ref 70–99)
Potassium: 4.4 mmol/L (ref 3.5–5.1)
Sodium: 141 mmol/L (ref 135–145)
Total Bilirubin: 0.4 mg/dL (ref 0.3–1.2)
Total Protein: 6.3 g/dL — ABNORMAL LOW (ref 6.5–8.1)

## 2021-03-27 LAB — IRON AND TIBC
Iron: 81 ug/dL (ref 41–142)
Saturation Ratios: 30 % (ref 21–57)
TIBC: 270 ug/dL (ref 236–444)
UIBC: 189 ug/dL (ref 120–384)

## 2021-03-27 LAB — FERRITIN: Ferritin: 41 ng/mL (ref 11–307)

## 2021-03-27 NOTE — Telephone Encounter (Signed)
CRITICAL VALUE STICKER  CRITICAL VALUE: WBC = 87.3  RECEIVER (on-site recipient of call): Yetta Glassman, CMA  DATE & TIME NOTIFIED: 03/27/21 at 3:02pm  MESSENGER (representative from lab): Rosann Auerbach  MD NOTIFIED: [place]  TIME OF NOTIFICATION: 03/27/21 at 3:07pm  RESPONSE: Notification given to [place], RN, for follow-up with pt and provider.

## 2021-03-28 LAB — IGG, IGA, IGM
IgA: 99 mg/dL (ref 87–352)
IgG (Immunoglobin G), Serum: 770 mg/dL (ref 586–1602)
IgM (Immunoglobulin M), Srm: 22 mg/dL — ABNORMAL LOW (ref 26–217)

## 2021-03-28 LAB — BETA 2 MICROGLOBULIN, SERUM: Beta-2 Microglobulin: 3.4 mg/L — ABNORMAL HIGH (ref 0.6–2.4)

## 2021-03-31 ENCOUNTER — Inpatient Hospital Stay (HOSPITAL_BASED_OUTPATIENT_CLINIC_OR_DEPARTMENT_OTHER): Payer: Medicare Other | Admitting: Oncology

## 2021-03-31 ENCOUNTER — Other Ambulatory Visit (HOSPITAL_COMMUNITY): Payer: Self-pay

## 2021-03-31 ENCOUNTER — Telehealth: Payer: Self-pay

## 2021-03-31 ENCOUNTER — Other Ambulatory Visit: Payer: 59

## 2021-03-31 ENCOUNTER — Other Ambulatory Visit: Payer: Self-pay

## 2021-03-31 VITALS — BP 125/62 | HR 74 | Temp 97.5°F | Resp 18 | Ht 62.0 in | Wt 123.6 lb

## 2021-03-31 DIAGNOSIS — Z8572 Personal history of non-Hodgkin lymphomas: Secondary | ICD-10-CM | POA: Diagnosis not present

## 2021-03-31 DIAGNOSIS — C8580 Other specified types of non-Hodgkin lymphoma, unspecified site: Secondary | ICD-10-CM | POA: Diagnosis not present

## 2021-03-31 MED ORDER — IBRUTINIB 280 MG PO TABS
280.0000 mg | ORAL_TABLET | Freq: Every day | ORAL | 4 refills | Status: DC
  Filled 2021-03-31: qty 112, fill #0

## 2021-03-31 MED ORDER — ALLOPURINOL 300 MG PO TABS: 300.0000 mg | ORAL_TABLET | Freq: Every day | ORAL | 3 refills | Status: DC

## 2021-03-31 NOTE — Telephone Encounter (Signed)
Oral Oncology Pharmacist Encounter  Received new prescription for ibrutinib (Imbruvica) for the treatment of B-cell non-Hodgkins nodal marginal zone lymphoma in conjunction with allopurinol, planned duration until disease progression or unacceptable toxicity.  Current medication list in Epic reviewed, DDIs with imbruvica identified: none   Evaluated chart and no patient barriers to medication adherence noted.   Patient agreement for treatment documented in MD note on 03/31/2021.  Prescription has been e-scribed to the [facility] for benefits analysis and approval.  Oral Oncology Clinic will continue to follow for insurance authorization, copayment issues, initial counseling and start date.  Patients insurance will not approve unless it is second line of therapy. Spoke to MD, patient will try rituximab first.  Drema Halon, PharmD Hematology/Oncology Clinical Pharmacist [facility] [phone] 03/31/2021 4:29 PM

## 2021-03-31 NOTE — Progress Notes (Signed)
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 She continues under observation.   She generally feels "fine".  She has no fatigue, walks 40 minutes every day, does yoga twice a week, and also does some weights.  She does tell me that she is perhaps sleeping a little more than before.  She has noted that the adenopathy in the left groin and left axilla in particular are a bit larger.  She had some ankle swelling bilaterally after a long flight, left greater than right.   REVIEW OF SYSTEMS: Alie otherwise denies fevers drenching sweats unexplained weight loss rash pruritus or any discomfort from her adenopathy.  A detailed review of systems was noncontributory except as noted.   COVID 19 VACCINATION STATUS: [place] x2, followed by 1 booster October 2021   HISTORY OF PRESENT ILNESS: From the 07/20/2014 intake note:  Eria had some "shooting pains across her neck" earliy in 2016, and saw ENT regarding this. There was a question of mild cervical adenopathy noted at that time. More recently, she underwent screening mammography 05/30/2014 at the [facility]. This suggested enlarged lymph nodes in the left axilla and the patient was recalled for left axillary ultrasound 06/08/2014. There were 2 enlarged lymph nodes, one measuring 2.4 cm, retaining its fatty hilum, but with the cortex measuring 4.5  mm. Posterior to that there was a second lymph node measuring 3.1 cm, with no internal fat. There was no mass noted in the upper outer quadrant of the left breast and review of the earlier mammogram showed no mammographic abnormality in either breast.  Biopsy of one of the left axillary lymph nodes 06/19/2014 showed ([id number]) a relatively monomorphic population of small round to slightly irregular lymphocytes, with dense chromatin and inconspicuous nucleoli. Flow cytometry ([id number]) showed a lambda restricted B-cell population expressing CD20, but not CD5, CD10 or cycling D1. Ki-67 was less than 10%. A preliminary diagnosis of B-cell non-Hodgkin's lymphoma, low-grade, was made..  On 06/29/2014 the patient underwent left axillary excisional biopsy with a total of 4 lymph nodes removed. The pathology from this more extensive sample showed ([id number]) effacement of the lymph node architecture by the lymphoma cells previously described, with only scattered larger lymphoid cells and histiocytes and no significant plasma cell component. There were scattered mitotic figures and no necrosis. Repeat flow cytometry showed most of the lymphocytes were CD20, CD23 and CD79a positive, with coexpression of CD43 but not CD5, CD10, BCL-6 or cyclin D1. The cells were BCL-2 positive. Overall it was pathology's impression that this was a low-grade B-cell non-Hodgkin's lymphoma, marginal zone type.  On 07/19/2014 Nerine underwent PET scanning which showed bilateral cervical adenopathy, bilateral axillary adenopathy, some small left common iliac lymph nodes and a normal spleen. There was very low metabolic activity in all the lymph nodes noted and similarly in the pelvic marrow suggesting possible marrow involvement.  The patient's subsequent history is as detailed below   PAST MEDICAL HISTORY: Past Medical History:  Diagnosis Date   Cancer ([place]) 2016   marginal zone non-hodgkins lymphoma   Medical history  non-contributory     PAST SURGICAL HISTORY: Past Surgical History:  Procedure Laterality Date   ABDOMINAL HYSTERECTOMY     AXILLARY LYMPH NODE BIOPSY Left 06/29/2014   Procedure: AXILLARY LYMPH NODE BIOPSY;  Surgeon: Armandina Gemma, MD;  Location: [facility];  Service: General;  Laterality: Left;   COLONOSCOPY     DIAGNOSTIC LAPAROSCOPY  2005   BSO   MASS EXCISION N/A 02/23/2019   Procedure: Excision of umbilicus skin lesion;  Surgeon: Wallace Going, DO;  Location: [facility];  Service: Plastics;  Laterality: N/A;    FAMILY HISTORY Family History  Problem Relation Age of Onset   Breast cancer Paternal 7   The patient's father died at the age of 31, the patient's mother at age 22. The patient has 2 brothers, no sisters. Keyonda's maternal grandmother had a diagnosis of leukemia, type unknown, in her 17s. A maternal cousin has a history of lymphoma.   GYNECOLOGIC HISTORY:  No LMP recorded. Patient has had a hysterectomy. Menarche age 65, first live birth age 65, the patient is GX P3. She underwent a hysterectomy with bilateral salpingo-oophorectomy at age 51. She continues on hormone replacement.   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 Her husband "Zan" retired from anesthesia July 2015. (Incidentally he was the anesthesiologist during my prostatectomy 2004). Their children are currently age 25, 81 and 21.  The patient has 2 grandchildren.  The patient attends [employer]   HEALTH MAINTENANCE: Social History   Tobacco Use   Smoking status: Never   Smokeless tobacco: Never  Substance Use Topics   Alcohol use: No   Drug use: No     Colonoscopy:  PAP:  Bone density:  Lipid panel:  Allergies  Allergen Reactions   Chloraprep One Step [Chlorhexidine Gluconate] Hives   Latex Rash   Tape Rash    Current Outpatient Medications   Medication Sig Dispense Refill   allopurinol (ZYLOPRIM) 300 MG tablet Take 1 tablet (300 mg total) by mouth daily. Start April 10, 2021 90 tablet 3   ibrutinib 280 MG TABS Take 280 mg by mouth daily with breakfast. 90 tablet 4   ALPRAZolam (XANAX) 0.5 MG tablet      calcium carbonate (OS-CAL) 1250 (500 CA) MG chewable tablet Chew by mouth.     Cholecalciferol (VITAMIN D3) 5000 UNITS TABS Take by mouth.     estradiol (VIVELLE-DOT) 0.05 MG/24HR patch Place 1 patch onto the skin 2 (two) times a week.     glucosamine-chondroitin 500-400 MG tablet Take by mouth.     ibuprofen (ADVIL,MOTRIN) 200 MG tablet Take 200 mg by mouth.     Multiple Vitamins-Minerals (MULTIVITAMIN WITH MINERALS) tablet Take 1 tablet by mouth daily.     OMEGA-3 FATTY ACIDS-VITAMIN E PO 1,000 capsules. Take 2 capsules by mouth daily.     triamcinolone (KENALOG) 0.025 % ointment Apply 1 application topically 2 (two) times daily. 30 g 0   zaleplon (SONATA) 10 MG capsule Take 10 mg by mouth at bedtime.     No current facility-administered medications for this visit.    OBJECTIVE: White woman who appears younger than stated age  43:   03/31/21 1504  BP: 125/62  Pulse: 74  Resp: 18  Temp: (!) 97.5 F (36.4 C)  SpO2: 96%    Wt Readings from Last 3 Encounters:  03/31/21 123 lb 9.6 oz (56.1 kg)  11/18/20 119 lb (54 kg)  05/15/20 122 lb 6.4 oz (55.5 kg)   Body mass index is 22.61 kg/m.    ECOG FS:1 - Symptomatic but completely ambulatory  Sclerae unicteric, EOMs intact Wearing a mask Right cervical adenopathy, small, shotty, particularly anterior right cervical triangle, measuring a few millimeters; bilateral axillary adenopathy, left greater than right, measuring about 2-1/2 cm on the right and about 3-1/2 cm on the left; the left axillary adenopathy appears fixed.  There is also bilateral inguinal adenopathy left greater than right.  The left inguinal lymph node appears approximately 4 cm in length. Lungs  no rales or rhonchi Heart regular rate and rhythm Abd soft, nontender, positive bowel sounds MSK no focal spinal tenderness, no lower extremity lymphedema Neuro: nonfocal, well oriented, appropriate affect Breasts: Deferred  LAB RESULTS:  CMP     Component Value Date/Time   NA 141 03/27/2021 1442   NA 140 02/10/2017 1352   K 4.4 03/27/2021 1442   K 3.8 02/10/2017 1352   CL 108 03/27/2021 1442   CO2 24 03/27/2021 1442   CO2 25 02/10/2017 1352   GLUCOSE 100 (H) 03/27/2021 1442   GLUCOSE 94 02/10/2017 1352   BUN 11 03/27/2021 1442   BUN 17.0 02/10/2017 1352   CREATININE 0.82 03/27/2021 1442   CREATININE 0.79 08/04/2017 1242   CREATININE 0.8 02/10/2017 1352   CALCIUM 9.2 03/27/2021 1442   CALCIUM 9.5 02/10/2017 1352   PROT 6.3 (L) 03/27/2021 1442   PROT 6.9 02/10/2017 1352   ALBUMIN 3.9 03/27/2021 1442   ALBUMIN 4.0 02/10/2017 1352   AST 30 03/27/2021 1442   AST 32 08/04/2017 1242   AST 30 02/10/2017 1352   ALT 17 03/27/2021 1442   ALT 26 08/04/2017 1242   ALT 21 02/10/2017 1352   ALKPHOS 185 (H) 03/27/2021 1442   ALKPHOS 118 02/10/2017 1352   BILITOT 0.4 03/27/2021 1442   BILITOT 0.5 08/04/2017 1242   BILITOT 0.44 02/10/2017 1352   GFRNONAA >60 03/27/2021 1442   GFRNONAA >60 08/04/2017 1242   GFRAA >60 08/10/2019 1103   GFRAA >60 08/04/2017 1242    INo results found for: SPEP, UPEP  Lab Results  Component Value Date   WBC 87.3 (HH) 03/27/2021   NEUTROABS 3.0 03/27/2021   HGB 11.0 (L) 03/27/2021   HCT 34.4 (L) 03/27/2021   MCV 95.8 03/27/2021   PLT 214 03/27/2021      Chemistry      Component Value Date/Time   NA 141 03/27/2021 1442   NA 140 02/10/2017 1352   K 4.4 03/27/2021 1442   K 3.8 02/10/2017 1352   CL 108 03/27/2021 1442   CO2 24 03/27/2021 1442   CO2 25 02/10/2017 1352   BUN 11 03/27/2021 1442   BUN 17.0 02/10/2017 1352   CREATININE 0.82 03/27/2021 1442   CREATININE 0.79 08/04/2017 1242   CREATININE 0.8 02/10/2017 1352      Component  Value Date/Time   CALCIUM 9.2 03/27/2021 1442   CALCIUM 9.5 02/10/2017 1352   ALKPHOS 185 (H) 03/27/2021 1442   ALKPHOS 118 02/10/2017 1352   AST 30 03/27/2021 1442   AST 32 08/04/2017 1242   AST 30 02/10/2017 1352   ALT 17 03/27/2021 1442   ALT 26 08/04/2017 1242   ALT 21 02/10/2017 1352   BILITOT 0.4 03/27/2021 1442   BILITOT 0.5 08/04/2017 1242  BILITOT 0.44 02/10/2017 1352      No results found for: LABCA2  No components found for: [id number]  No results for input(s): INR in the last 168 hours.  Urinalysis No results found for: COLORURINE, APPEARANCEUR, LABSPEC, PHURINE, GLUCOSEU, HGBUR, BILIRUBINUR, KETONESUR, PROTEINUR, UROBILINOGEN, NITRITE, LEUKOCYTESUR   STUDIES: No results found.   ASSESSMENT: 65 y.o. [place] woman s/p left axillary lymph node biopsy 06/29/2014 for a B-cell non-Hodgkin's lymphoma, consistent with marginal zone nodal lymphoma, advanced stage  (a) biopsy of a right breast mass 04/12/2018 showed 89% of the cells to be lambda restricted, with immunophenotype consistent with marginal zone non-Hodgkin's lymphoma (CD19, 20 and 23+)  (b) signed up for [facility] 08/29/2014  (c)  serology at [facility] 08/29/2014 was negative for hepatitis C antibody, hepatitis B core antibody, hepatitis B surface antigen, hepatitis B surface antibody, or HIV antigen or antibody  (d) status post zoster vaccine 2019  (1) to start ibrutinib at 280 mg daily   (a) starting allopurinol 04/10/2021   PLAN: Siddhi is now 6-1/2 years out from initial diagnosis of her low-grade non-Hodgkin's lymphoma.  She has anemia, a rising alkaline phosphatase, a following IgM level, and of course a rising lymphocyte count now greater than 80,000.  Perhaps of greatest concern to me is her left inguinal adenopathy which at least on one occasion appears to have caused some left lower extremity lymphedema.  She is reluctant to start treatment and we discussed the option of a further period of observation,  but after much discussion we decided to give ibrutinib a try.  We will go with 280 mg which is a low dose of course and she will start allopurinol 2 weeks before starting that medication  She has a good understanding of the possible toxicities side effects and complications of ibrutinib including concerns regarding bruising and bleeding and risk of atrial fibrillation.  She will let me know if there are any issues once she starts the medication and she will follow-up with Dr. Lindi Adie in February to discuss tolerance and initial results of treatment.  Total encounter time 25 minutes.*   Keilynn Marano, Virgie Dad, MD  03/31/21 3:32 PM Medical Oncology and Hematology [facility] [place], [place] [address] Tel. [phone]    Fax. [phone]   I, Wilburn Mylar, am acting as scribe for Dr. Virgie Dad. Tyrea Froberg.  I, Lurline Del MD, have reviewed the above documentation for accuracy and completeness, and I agree with the above.   *Total Encounter Time as defined by the Centers for Medicare and Medicaid Services includes, in addition to the face-to-face time of a patient visit (documented in the note above) non-face-to-face time: obtaining and reviewing outside history, ordering and reviewing medications, tests or procedures, care coordination (communications with other health care professionals or caregivers) and documentation in the medical record.

## 2021-04-01 ENCOUNTER — Telehealth: Payer: Self-pay | Admitting: Pharmacist

## 2021-04-01 ENCOUNTER — Telehealth: Payer: Self-pay | Admitting: Pharmacy Technician

## 2021-04-01 NOTE — Telephone Encounter (Signed)
Oral Oncology Patient Advocate Encounter  Received New start notification for  Imbruvica . Will update as we work through the benefits process.   Submitted a Prior Authorization request to [facility] for  Imbruvica 280mg   via CoverMyMeds. Will update once we receive a response.   (Key: [id number]) - [id number]

## 2021-04-01 NOTE — Telephone Encounter (Signed)
[facility]       Telephone: [phone]?Fax: [phone]   Oncology Clinical Pharmacist Practitioner Progress Note  Dana Butler is a 65 y.o. female with a diagnosis of MZL starting ibrutinib tentatively on 04/27/21 under the care of Dr. Jana Hakim, then will be followed by Dr. Nicholas Lose.  Dr. Jana Hakim reached out to clinical pharmacy today to ask if we could call patient and summarize her treatment plan.  Dr. Jana Hakim recommended baseline labs which he has ordered.  He wanted Dana Butler starting allopurinol on 04/10/21. After speaking to our oral oncology pharmacist, the ideal date for labs will be week of 04/14/21 due to potential delays with shipment during the holidays. The oral chemotherapy pharmacist will also reach out to patient likely the last week of December to discuss side effects, shipping, etc.   This scheduling appointment for labs has been sent and a general v/m left for Dana Butler letting her know that scheduling would be reaching out for a date/time that will work for patient.  Dr. Jana Hakim will be retiring on 04/17/21 and patient will then be followed by Dr. Lindi Adie.  She has an appointment scheduled by Dr. Jana Hakim for labs  and visit with Dr. Lindi Adie in early February.    Clinical pharmacy will continue to support [place] and Drs Jana Hakim / Lindi Adie  as needed.  Raina Mina, RPH-CPP,  04/01/2021  9:09 AM

## 2021-04-02 NOTE — Telephone Encounter (Signed)
Received a fax regarding Prior Authorization from [facility] for  [place] . Authorization has been DENIED because For marginal zone lymphoma (including gastric mucosa-associated lymphoid tissue [MALT] lymphoma, non-gastric MALT lymphoma, nodal marginal zone lymphoma, and splenic marginal zone lymphoma): the patient has received at least one prior therapy.

## 2021-04-03 ENCOUNTER — Other Ambulatory Visit: Payer: Self-pay | Admitting: Oncology

## 2021-04-03 ENCOUNTER — Telehealth: Payer: Self-pay | Admitting: Pharmacist

## 2021-04-03 DIAGNOSIS — Z7189 Other specified counseling: Secondary | ICD-10-CM | POA: Insufficient documentation

## 2021-04-03 NOTE — Progress Notes (Signed)
[employer] insurance will not approve ibrutinib unless she receives Rituxan first.  I called her and left her messages but I think she is out of town for the holidays.  I have scheduled her to return January 17.  She can discuss treatment then and can receive the treatment at the same day.

## 2021-04-03 NOTE — Telephone Encounter (Signed)
Scheduled per sch msg. Called and spoke with patient. Confirmed appt  

## 2021-04-04 ENCOUNTER — Telehealth: Payer: Self-pay | Admitting: *Deleted

## 2021-04-04 ENCOUNTER — Telehealth: Payer: Self-pay | Admitting: Oncology

## 2021-04-04 NOTE — Telephone Encounter (Signed)
This RN spoke with pt per her call wanting to follow up on change in medication from [place] to rituxan.  Note pt put RN on speaker phone so her husband could participate in discussion.  This RN reviewed with pt administration of medication, side effects and expectations relating to therapy.  Pt has an active prescription for the allopurinol and understands it needs to be started prior to therapy.  Pt is currently scheduled to start 05/13/2021 and will start the allopurinol on 05/12/2021.  Questions answered including to call if she has further concerns or symptoms.  Pt and her husband stated appreciation of call and discussion.

## 2021-04-04 NOTE — Telephone Encounter (Signed)
Scheduled per sch msg. Called and spoke with patient. Confirmed appts  

## 2021-04-15 ENCOUNTER — Other Ambulatory Visit: Payer: Self-pay

## 2021-04-15 ENCOUNTER — Other Ambulatory Visit: Payer: Self-pay | Admitting: Obstetrics & Gynecology

## 2021-04-15 ENCOUNTER — Other Ambulatory Visit: Payer: Self-pay | Admitting: Obstetrics and Gynecology

## 2021-04-15 ENCOUNTER — Inpatient Hospital Stay: Payer: Medicare Other

## 2021-04-15 ENCOUNTER — Telehealth: Payer: Self-pay

## 2021-04-15 DIAGNOSIS — Z1231 Encounter for screening mammogram for malignant neoplasm of breast: Secondary | ICD-10-CM

## 2021-04-15 DIAGNOSIS — C8580 Other specified types of non-Hodgkin lymphoma, unspecified site: Secondary | ICD-10-CM

## 2021-04-15 DIAGNOSIS — Z8572 Personal history of non-Hodgkin lymphomas: Secondary | ICD-10-CM | POA: Diagnosis not present

## 2021-04-15 LAB — COMPREHENSIVE METABOLIC PANEL
ALT: 18 U/L (ref 0–44)
AST: 31 U/L (ref 15–41)
Albumin: 4.2 g/dL (ref 3.5–5.0)
Alkaline Phosphatase: 176 U/L — ABNORMAL HIGH (ref 38–126)
Anion gap: 6 (ref 5–15)
BUN: 14 mg/dL (ref 8–23)
CO2: 29 mmol/L (ref 22–32)
Calcium: 10.2 mg/dL (ref 8.9–10.3)
Chloride: 104 mmol/L (ref 98–111)
Creatinine, Ser: 0.76 mg/dL (ref 0.44–1.00)
GFR, Estimated: >60 mL/min (ref >60)
Glucose, Bld: 77 mg/dL (ref 70–99)
Potassium: 3.8 mmol/L (ref 3.5–5.1)
Sodium: 139 mmol/L (ref 135–145)
Total Bilirubin: 0.7 mg/dL (ref 0.3–1.2)
Total Protein: 6.7 g/dL (ref 6.5–8.1)

## 2021-04-15 LAB — CBC WITH DIFFERENTIAL/PLATELET
Abs Immature Granulocytes: 0.11 10*3/uL — ABNORMAL HIGH (ref 0.00–0.07)
Basophils Absolute: 0.1 10*3/uL (ref 0.0–0.1)
Basophils Relative: 0 %
Eosinophils Absolute: 0.5 10*3/uL (ref 0.0–0.5)
Eosinophils Relative: 1 %
HCT: 34.6 % — ABNORMAL LOW (ref 36.0–46.0)
Hemoglobin: 11 g/dL — ABNORMAL LOW (ref 12.0–15.0)
Immature Granulocytes: 0 %
Lymphocytes Relative: 94 %
Lymphs Abs: 85.9 10*3/uL — ABNORMAL HIGH (ref 0.7–4.0)
MCH: 30.3 pg (ref 26.0–34.0)
MCHC: 31.8 g/dL (ref 30.0–36.0)
MCV: 95.3 fL (ref 80.0–100.0)
Monocytes Absolute: 1.6 10*3/uL — ABNORMAL HIGH (ref 0.1–1.0)
Monocytes Relative: 2 %
Neutro Abs: 3.1 10*3/uL (ref 1.7–7.7)
Neutrophils Relative %: 3 %
Platelets: 216 10*3/uL (ref 150–400)
RBC: 3.63 MIL/uL — ABNORMAL LOW (ref 3.87–5.11)
RDW: 17.2 % — ABNORMAL HIGH (ref 11.5–15.5)
Smear Review: NORMAL
WBC: 91.2 10*3/uL (ref 4.0–10.5)
nRBC: 0 % (ref 0.0–0.2)

## 2021-04-15 LAB — URIC ACID: Uric Acid, Serum: 4.7 mg/dL (ref 2.5–7.1)

## 2021-04-15 NOTE — Telephone Encounter (Signed)
CRITICAL VALUE STICKER  CRITICAL VALUE: WBC = 91.2  RECEIVER (on-site recipient of call): Yetta Glassman, CMA  DATE & TIME NOTIFIED: 04/15/21 at 2:59pm  MESSENGER (representative from lab): Rosann Auerbach  MD NOTIFIED: Magrinat  TIME OF NOTIFICATION: 04/15/21 at 3:03pm  RESPONSE: Notification given to Val, RN for follow-up with pt and provider.

## 2021-04-16 LAB — IGG, IGA, IGM
IgA: 111 mg/dL (ref 87–352)
IgG (Immunoglobin G), Serum: 795 mg/dL (ref 586–1602)
IgM (Immunoglobulin M), Srm: 23 mg/dL — ABNORMAL LOW (ref 26–217)

## 2021-04-16 LAB — BETA 2 MICROGLOBULIN, SERUM: Beta-2 Microglobulin: 3.6 mg/L — ABNORMAL HIGH (ref 0.6–2.4)

## 2021-04-18 ENCOUNTER — Telehealth: Payer: Self-pay | Admitting: *Deleted

## 2021-04-18 NOTE — Telephone Encounter (Signed)
This RN spoke with pt per MD review of recent lab with request for pt to start the allopurinol 04/27/2021 in anticipation of Rituxin therapy.  Pt verbalized understanding.

## 2021-05-06 NOTE — Progress Notes (Signed)
Pharmacist Chemotherapy Monitoring - Initial Assessment    Anticipated start date: 05/13/21   The following has been reviewed per standard work regarding the patient's treatment regimen: The patient's diagnosis, treatment plan and drug doses, and organ/hematologic function Lab orders and baseline tests specific to treatment regimen  The treatment plan start date, drug sequencing, and pre-medications Prior authorization status  Patient's documented medication list, including drug-drug interaction screen and prescriptions for anti-emetics and supportive care specific to the treatment regimen The drug concentrations, fluid compatibility, administration routes, and timing of the medications to be used The patient's access for treatment and lifetime cumulative dose history, if applicable  The patient's medication allergies and previous infusion related reactions, if applicable   Changes made to treatment plan:  N/A  Follow up needed:  N/A   Larene Beach, RPH, 05/06/2021  1:27 PM

## 2021-05-12 NOTE — Progress Notes (Signed)
Patient Care Team: Sueanne Margarita, DO as PCP - General (Internal Medicine) Jodi Marble, MD as Consulting Physician (Otolaryngology) Armandina Gemma, MD as Consulting Physician (General Surgery) Kennith Center, RD as Dietitian (Family Medicine)  DIAGNOSIS:    ICD-10-CM   1. 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[place])  C85.80       SUMMARY OF ONCOLOGIC HISTORY: Oncology History  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[place])  06/29/2014 Initial Diagnosis   left axillary lymph node biopsy 06/29/2014 for a B-cell non-Hodgkin's lymphoma, consistent with marginal zone nodal lymphoma, advanced stage   07/19/2014 Miscellaneous   PET scanning which showed bilateral cervical adenopathy, bilateral axillary adenopathy, some small left common iliac lymph nodes and a normal spleen   04/12/2018 Pathology Results   biopsy of a right breast mass 04/12/2018 showed 89% of the cells to be lambda restricted, with immunophenotype consistent with marginal zone non-Hodgkin's lymphoma (CD19, 20 and 23+)   04/27/2021 Miscellaneous   ibrutinib at 280 mg daily    05/13/2021 -  Chemotherapy   Patient is on Treatment Plan : NON-HODGKINS LYMPHOMA Rituximab q21d       CHIEF COMPLIANT: Follow-up of nodal marginal zone non-Hodgkin's lymphoma to start Rituxan  INTERVAL HISTORY: Dana Butler is a 66 y.o. with above-mentioned history of nodal marginal zone non-Hodgkin's lymphoma. Mammogram on 05/16/2020 showed no evidence of malignancy. She presents to the clinic today to receive the first cycle of Rituxan.  She has noticed over the past several weeks to months that the groin lymph nodes have been slowly starting to get bigger.  They are not causing her too much problems at this time but with the rapid increase in the white blood cell count over the past year, the decision was made to initiate systemic treatment and after much discussion and consultation with the [facility] the plan is to treat her with Rituxan every 3 weeks x4  cycles.  ALLERGIES:  is allergic to chloraprep one step [chlorhexidine gluconate], latex, and tape.  MEDICATIONS:  Current Outpatient Medications  Medication Sig Dispense Refill   allopurinol (ZYLOPRIM) 300 MG tablet Take 1 tablet (300 mg total) by mouth daily. Start April 10, 2021 90 tablet 3   ALPRAZolam (XANAX) 0.5 MG tablet      calcium carbonate (OS-CAL) 1250 (500 CA) MG chewable tablet Chew by mouth.     Cholecalciferol (VITAMIN D3) 5000 UNITS TABS Take by mouth.     estradiol (VIVELLE-DOT) 0.05 MG/24HR patch Place 1 patch onto the skin 2 (two) times a week.     glucosamine-chondroitin 500-400 MG tablet Take by mouth.     ibuprofen (ADVIL,MOTRIN) 200 MG tablet Take 200 mg by mouth.     Multiple Vitamins-Minerals (MULTIVITAMIN WITH MINERALS) tablet Take 1 tablet by mouth daily.     OMEGA-3 FATTY ACIDS-VITAMIN E PO 1,000 capsules. Take 2 capsules by mouth daily.     triamcinolone (KENALOG) 0.025 % ointment Apply 1 application topically 2 (two) times daily. 30 g 0   zaleplon (SONATA) 10 MG capsule Take 10 mg by mouth at bedtime.     No current facility-administered medications for this visit.    PHYSICAL EXAMINATION: ECOG PERFORMANCE STATUS: 1 - Symptomatic but completely ambulatory  Vitals:   05/13/21 0818  BP: (!) 122/50  Pulse: 84  Resp: 18  Temp: (!) 97.5 F (36.4 C)  SpO2: 96%   Filed Weights   05/13/21 0818  Weight: 122 lb 8 oz (55.6 kg)    LABORATORY DATA:  I have reviewed the  data as listed CMP Latest Ref Rng & Units 04/15/2021 03/27/2021 11/15/2020  Glucose 70 - 99 mg/dL 77 100(H) 91  BUN 8 - 23 mg/dL 14 11 18   Creatinine 0.44 - 1.00 mg/dL 0.76 0.82 0.79  Sodium 135 - 145 mmol/L 139 141 141  Potassium 3.5 - 5.1 mmol/L 3.8 4.4 4.3  Chloride 98 - 111 mmol/L 104 108 106  CO2 22 - 32 mmol/L 29 24 27   Calcium 8.9 - 10.3 mg/dL 10.2 9.2 9.5  Total Protein 6.5 - 8.1 g/dL 6.7 6.3(L) 6.5  Total Bilirubin 0.3 - 1.2 mg/dL 0.7 0.4 0.5  Alkaline Phos 38 - 126 U/L  176(H) 185(H) 157(H)  AST 15 - 41 U/L 31 30 33  ALT 0 - 44 U/L 18 17 21     Lab Results  Component Value Date   WBC 91.2 (HH) 04/15/2021   HGB 11.0 (L) 04/15/2021   HCT 34.6 (L) 04/15/2021   MCV 95.3 04/15/2021   PLT 216 04/15/2021   NEUTROABS 3.1 04/15/2021    ASSESSMENT & PLAN:  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[place]) 2016: left axillary lymph node biopsy 06/29/2014 for a B-cell non-Hodgkin's lymphoma, consistent with marginal zone nodal lymphoma, advanced stage 2016: PET scanning which showed bilateral cervical adenopathy, bilateral axillary adenopathy, some small left common iliac lymph nodes and a normal spleen 2019: biopsy of a right breast mass 04/12/2018 showed 89% of the cells to be lambda restricted, with immunophenotype consistent with marginal zone non-Hodgkin's lymphoma (CD19, 20 and 23+)   Current Treatment: Rituxan every 3 weeks x4 cycles starting 05/03/2021 (we will may consider maintenance Rituxan every 3 months for 2 years)  Rituxan counseling: Discussed risks and benefits of Rituxan including the risk of first infusion reaction as well as tumor lysis.  She is already on allopurinol.  We also discussed the chronic B-cell suppression which might lead to chronic infections as well.  RTC in 1 week for toxicity check    No orders of the defined types were placed in this encounter.  The patient has a good understanding of the overall plan. she agrees with it. she will call with any problems that may develop before the next visit here.  Total time spent: 45 mins including face to face time and time spent for planning, charting and coordination of care  Rulon Eisenmenger, MD, MPH 05/13/2021  I, Thana Ates, am acting as scribe for Dr. Nicholas Lose.  I have reviewed the above documentation for accuracy and completeness, and I agree with the above.

## 2021-05-12 NOTE — Assessment & Plan Note (Signed)
2016: left axillary lymph node biopsy 06/29/2014 for a B-cell non-Hodgkin's lymphoma, consistent with marginal zone nodal lymphoma, advanced stage 2016: PET scanning which showed bilateral cervical adenopathy, bilateral axillary adenopathy, some small left common iliac lymph nodes and a normal spleen 2019: biopsy of a right breast mass 04/12/2018 showed 89% of the cells to be lambda restricted, with immunophenotype consistent with marginal zone non-Hodgkin's lymphoma (CD19, 20 and 23+)   Current Treatment: Ibrutinib 280 mg started 04/27/2021  Ibrutinib Toxicities:  RTC

## 2021-05-13 ENCOUNTER — Other Ambulatory Visit: Payer: Self-pay

## 2021-05-13 ENCOUNTER — Encounter: Payer: Self-pay | Admitting: *Deleted

## 2021-05-13 ENCOUNTER — Inpatient Hospital Stay: Payer: Medicare Other | Attending: Oncology

## 2021-05-13 ENCOUNTER — Inpatient Hospital Stay (HOSPITAL_BASED_OUTPATIENT_CLINIC_OR_DEPARTMENT_OTHER): Payer: Medicare Other | Admitting: Hematology and Oncology

## 2021-05-13 ENCOUNTER — Inpatient Hospital Stay: Payer: Medicare Other

## 2021-05-13 VITALS — BP 116/55 | HR 96 | Temp 98.5°F | Resp 18

## 2021-05-13 VITALS — BP 122/50 | HR 84 | Temp 97.5°F | Resp 18 | Ht 62.0 in | Wt 122.5 lb

## 2021-05-13 DIAGNOSIS — C8580 Other specified types of non-Hodgkin lymphoma, unspecified site: Secondary | ICD-10-CM | POA: Insufficient documentation

## 2021-05-13 DIAGNOSIS — M8580 Other specified disorders of bone density and structure, unspecified site: Secondary | ICD-10-CM

## 2021-05-13 DIAGNOSIS — D649 Anemia, unspecified: Secondary | ICD-10-CM

## 2021-05-13 DIAGNOSIS — Z5112 Encounter for antineoplastic immunotherapy: Secondary | ICD-10-CM | POA: Diagnosis not present

## 2021-05-13 LAB — HEPATITIS B SURFACE ANTIGEN: Hepatitis B Surface Ag: NONREACTIVE

## 2021-05-13 LAB — COMPREHENSIVE METABOLIC PANEL
ALT: 19 U/L (ref 0–44)
AST: 31 U/L (ref 15–41)
Albumin: 3.9 g/dL (ref 3.5–5.0)
Alkaline Phosphatase: 169 U/L — ABNORMAL HIGH (ref 38–126)
Anion gap: 7 (ref 5–15)
BUN: 18 mg/dL (ref 8–23)
CO2: 24 mmol/L (ref 22–32)
Calcium: 9.2 mg/dL (ref 8.9–10.3)
Chloride: 107 mmol/L (ref 98–111)
Creatinine, Ser: 1 mg/dL (ref 0.44–1.00)
GFR, Estimated: >60 mL/min (ref >60)
Glucose, Bld: 96 mg/dL (ref 70–99)
Potassium: 4.3 mmol/L (ref 3.5–5.1)
Sodium: 138 mmol/L (ref 135–145)
Total Bilirubin: 0.5 mg/dL (ref 0.3–1.2)
Total Protein: 6.2 g/dL — ABNORMAL LOW (ref 6.5–8.1)

## 2021-05-13 LAB — CBC WITH DIFFERENTIAL/PLATELET
Abs Immature Granulocytes: 0.11 10*3/uL — ABNORMAL HIGH (ref 0.00–0.07)
Basophils Absolute: 0.1 10*3/uL (ref 0.0–0.1)
Basophils Relative: 0 %
Eosinophils Absolute: 0.4 10*3/uL (ref 0.0–0.5)
Eosinophils Relative: 1 %
HCT: 33.9 % — ABNORMAL LOW (ref 36.0–46.0)
Hemoglobin: 11 g/dL — ABNORMAL LOW (ref 12.0–15.0)
Immature Granulocytes: 0 %
Lymphocytes Relative: 95 %
Lymphs Abs: 82.4 10*3/uL — ABNORMAL HIGH (ref 0.7–4.0)
MCH: 30.4 pg (ref 26.0–34.0)
MCHC: 32.4 g/dL (ref 30.0–36.0)
MCV: 93.6 fL (ref 80.0–100.0)
Monocytes Absolute: 0.3 10*3/uL (ref 0.1–1.0)
Monocytes Relative: 0 %
Neutro Abs: 3.2 10*3/uL (ref 1.7–7.7)
Neutrophils Relative %: 4 %
Platelets: 234 10*3/uL (ref 150–400)
RBC: 3.62 MIL/uL — ABNORMAL LOW (ref 3.87–5.11)
RDW: 16.5 % — ABNORMAL HIGH (ref 11.5–15.5)
Smear Review: NORMAL
WBC: 86.6 10*3/uL (ref 4.0–10.5)
nRBC: 0 % (ref 0.0–0.2)

## 2021-05-13 LAB — HEPATITIS B CORE ANTIBODY, TOTAL: Hep B Core Total Ab: NONREACTIVE

## 2021-05-13 LAB — URIC ACID: Uric Acid, Serum: 2.8 mg/dL (ref 2.5–7.1)

## 2021-05-13 MED ORDER — ACETAMINOPHEN 325 MG PO TABS
650.0000 mg | ORAL_TABLET | Freq: Once | ORAL | Status: AC
  Administered 2021-05-13: 650 mg via ORAL
  Filled 2021-05-13: qty 2

## 2021-05-13 MED ORDER — SODIUM CHLORIDE 0.9 % IV SOLN: Freq: Once | INTRAVENOUS | Status: AC

## 2021-05-13 MED ORDER — DIPHENHYDRAMINE HCL 25 MG PO CAPS
50.0000 mg | ORAL_CAPSULE | Freq: Once | ORAL | Status: AC
  Administered 2021-05-13: 50 mg via ORAL
  Filled 2021-05-13: qty 2

## 2021-05-13 MED ORDER — SODIUM CHLORIDE 0.9 % IV SOLN
375.0000 mg/m2 | Freq: Once | INTRAVENOUS | Status: AC
  Administered 2021-05-13: 600 mg via INTRAVENOUS
  Filled 2021-05-13: qty 50

## 2021-05-13 MED ORDER — LIDOCAINE-PRILOCAINE 2.5-2.5 % EX CREA: TOPICAL_CREAM | CUTANEOUS | 3 refills | Status: DC

## 2021-05-13 NOTE — Patient Instructions (Signed)
[facility] ONCOL[facility]  Discharge Instructions: Thank you for choosing [facility] to provide your oncology and hematology care.   If you have a lab appointment with the [facility], please go directly to the [facility] and check in at the registration area.   Wear comfortable clothing and clothing appropriate for easy access to any Portacath or PICC line.   We strive to give you quality time with your provider. You may need to reschedule your appointment if you arrive late (15 or more minutes).  Arriving late affects you and other patients whose appointments are after yours.  Also, if you miss three or more appointments without notifying the office, you may be dismissed from the clinic at the providers discretion.      For prescription refill requests, have your pharmacy contact our office and allow 72 hours for refills to be completed.    Today you received the following chemotherapy and/or immunotherapy agent: Rituximab   To help prevent nausea and vomiting after your treatment, we encourage you to take your nausea medication as directed.  BELOW ARE SYMPTOMS THAT SHOULD BE REPORTED IMMEDIATELY: *FEVER GREATER THAN 100.4 F (38 C) OR HIGHER *CHILLS OR SWEATING *NAUSEA AND VOMITING THAT IS NOT CONTROLLED WITH YOUR NAUSEA MEDICATION *UNUSUAL SHORTNESS OF BREATH *UNUSUAL BRUISING OR BLEEDING *URINARY PROBLEMS (pain or burning when urinating, or frequent urination) *BOWEL PROBLEMS (unusual diarrhea, constipation, pain near the anus) TENDERNESS IN MOUTH AND THROAT WITH OR WITHOUT PRESENCE OF ULCERS (sore throat, sores in mouth, or a toothache) UNUSUAL RASH, SWELLING OR PAIN  UNUSUAL VAGINAL DISCHARGE OR ITCHING   Items with * indicate a potential emergency and should be followed up as soon as possible or go to the Emergency Department if any problems should occur.  Please show the CHEMOTHERAPY ALERT CARD or IMMUNOTHERAPY ALERT CARD at check-in to the  Emergency Department and triage nurse.  Should you have questions after your visit or need to cancel or reschedule your appointment, please contact [facility]  Dept: [phone]  and follow the prompts.  Office hours are 8:00 a.m. to 4:30 p.m. Monday - Friday. Please note that voicemails left after 4:00 p.m. may not be returned until the following business day.  We are closed weekends and major holidays. You have access to a nurse at all times for urgent questions. Please call the main number to the clinic Dept: [phone] and follow the prompts.   For any non-urgent questions, you may also contact your provider using MyChart. We now offer e-Visits for anyone 32 and older to request care online for non-urgent symptoms. For details visit [email or web address].   Also download the MyChart app! Go to the app store, search "MyChart", open the app, select [facility], and log in with your MyChart username and password.  Due to Covid, a mask is required upon entering the hospital/clinic. If you do not have a mask, one will be given to you upon arrival. For doctor visits, patients may have 1 support person aged 75 or older with them. For treatment visits, patients cannot have anyone with them due to current Covid guidelines and our immunocompromised population.   Rituximab Injection What is this medication? RITUXIMAB (ri TUX i mab) is a monoclonal antibody. It is used to treat certain types of cancer like non-Hodgkin lymphoma and chronic lymphocytic leukemia. It is also used to treat rheumatoid arthritis, granulomatosis with polyangiitis, microscopic polyangiitis, and pemphigus vulgaris. This medicine may be used  for other purposes; ask your health care provider or pharmacist if you have questions. COMMON BRAND NAME(S): RIABNI, Rituxan, RUXIENCE What should I tell my care team before I take this medication? They need to know if you have any of these conditions: chest  pain heart disease infection especially a viral infection such as chickenpox, cold sores, hepatitis B, or herpes immune system problems irregular heartbeat or rhythm kidney disease low blood counts (white cells, platelets, or red cells) lung disease recent or upcoming vaccine an unusual or allergic reaction to rituximab, other medicines, foods, dyes, or preservatives pregnant or trying to get pregnant breast-feeding How should I use this medication? This medicine is injected into a vein. It is given by a health care provider in a hospital or clinic setting. A special MedGuide will be given to you before each treatment. Be sure to read this information carefully each time. Talk to your health care provider about the use of this medicine in children. While this drug may be prescribed for children as young as 6 months for selected conditions, precautions do apply. Overdosage: If you think you have taken too much of this medicine contact a poison control center or emergency room at once. NOTE: This medicine is only for you. Do not share this medicine with others. What if I miss a dose? Keep appointments for follow-up doses. It is important not to miss your dose. Call your health care provider if you are unable to keep an appointment. What may interact with this medication? Do not take this medicine with any of the following medicines: live vaccines This medicine may also interact with the following medicines: cisplatin This list may not describe all possible interactions. Give your health care provider a list of all the medicines, herbs, non-prescription drugs, or dietary supplements you use. Also tell them if you smoke, drink alcohol, or use illegal drugs. Some items may interact with your medicine. What should I watch for while using this medication? Your condition will be monitored carefully while you are receiving this medicine. You may need blood work done while you are taking this  medicine. This medicine can cause serious infusion reactions. To reduce the risk your health care provider may give you other medicines to take before receiving this one. Be sure to follow the directions from your health care provider. This medicine may increase your risk of getting an infection. Call your health care provider for advice if you get a fever, chills, sore throat, or other symptoms of a cold or flu. Do not treat yourself. Try to avoid being around people who are sick. Call your health care provider if you are around anyone with measles, chickenpox, or if you develop sores or blisters that do not heal properly. Avoid taking medicines that contain aspirin, acetaminophen, ibuprofen, naproxen, or ketoprofen unless instructed by your health care provider. These medicines may hide a fever. This medicine may cause serious skin reactions. They can happen weeks to months after starting the medicine. Contact your health care provider right away if you notice fevers or flu-like symptoms with a rash. The rash may be red or purple and then turn into blisters or peeling of the skin. Or, you might notice a red rash with swelling of the face, lips or lymph nodes in your neck or under your arms. In some patients, this medicine may cause a serious brain infection that may cause death. If you have any problems seeing, thinking, speaking, walking, or standing, tell your healthcare professional right  away. If you cannot reach your healthcare professional, urgently seek other source of medical care. Do not become pregnant while taking this medicine or for at least 12 months after stopping it. Women should inform their health care provider if they wish to become pregnant or think they might be pregnant. There is potential for serious harm to an unborn child. Talk to your health care provider for more information. Women should use a reliable form of birth control while taking this medicine and for 12 months after  stopping it. Do not breast-feed while taking this medicine or for at least 6 months after stopping it. What side effects may I notice from receiving this medication? Side effects that you should report to your health care provider as soon as possible: allergic reactions (skin rash, itching or hives; swelling of the face, lips, or tongue) diarrhea edema (sudden weight gain; swelling of the ankles, feet, hands or other unusual swelling; trouble breathing) fast, irregular heartbeat heart attack (trouble breathing; pain or tightness in the chest, neck, back or arms; unusually weak or tired) infection (fever, chills, cough, sore throat, pain or trouble passing urine) kidney injury (trouble passing urine or change in the amount of urine) liver injury (dark yellow or brown urine; general ill feeling or flu-like symptoms; loss of appetite, right upper belly pain; unusually weak or tired, yellowing of the eyes or skin) low blood pressure (dizziness; feeling faint or lightheaded, falls; unusually weak or tired) low red blood cell counts (trouble breathing; feeling faint; lightheaded, falls; unusually weak or tired) mouth sores redness, blistering, peeling, or loosening of the skin, including inside the mouth stomach pain unusual bruising or bleeding wheezing (trouble breathing with loud or whistling sounds) vomiting Side effects that usually do not require medical attention (report to your health care provider if they continue or are bothersome): headache joint pain muscle cramps, pain nausea This list may not describe all possible side effects. Call your doctor for medical advice about side effects. You may report side effects to FDA at 1-800-FDA-1088. Where should I keep my medication? This medicine is given in a hospital or clinic. It will not be stored at home. NOTE: This sheet is a summary. It may not cover all possible information. If you have questions about this medicine, talk to your  doctor, pharmacist, or health care provider.  2022 Elsevier/Gold Standard (2020-04-15 00:00:00)

## 2021-05-13 NOTE — Progress Notes (Signed)
Per Dr Lindi Adie Proceed with treatment. She had labs in 2016 at [facility] for hepatitis B and C which were all negative.  Larene Beach, PharmD

## 2021-05-13 NOTE — Progress Notes (Signed)
CRITICAL VALUE STICKER  CRITICAL VALUE: WBC 86.6  Geraldo Docker, RN  DATE & TIME NOTIFIED: 05/13/21 at 0826  MD NOTIFIED: Nicholas Lose, MD  [place]: 05/13/21 at 1638  RESPONSE:  MD notified, verbalized understanding.  No orders received at this time.

## 2021-05-13 NOTE — Progress Notes (Signed)
Treatment completed and pt. stood up complained of dizziness, no chest pain, and no shortness of breath noted. States she may have gotten up to fast. Pt. sat back down and vital signs taken. Blood pressure remains low see vital sign flow sheet. Pt. sat for approximately 30 more minutes, states she has had about 6 cups of water while here. Denies dizziness and vital signs stable. Left via ambulation, no respiratory distress noted.

## 2021-05-14 LAB — IGG, IGA, IGM
IgA: 103 mg/dL (ref 87–352)
IgG (Immunoglobin G), Serum: 761 mg/dL (ref 586–1602)
IgM (Immunoglobulin M), Srm: 21 mg/dL — ABNORMAL LOW (ref 26–217)

## 2021-05-14 LAB — BETA 2 MICROGLOBULIN, SERUM: Beta-2 Microglobulin: 3.6 mg/L — ABNORMAL HIGH (ref 0.6–2.4)

## 2021-05-15 ENCOUNTER — Other Ambulatory Visit: Payer: Self-pay

## 2021-05-15 ENCOUNTER — Ambulatory Visit
Admission: RE | Admit: 2021-05-15 | Discharge: 2021-05-15 | Disposition: A | Discharge status: Home / Self Care | Payer: Medicare Other | Source: Ambulatory Visit | Attending: Obstetrics and Gynecology | Admitting: Obstetrics and Gynecology

## 2021-05-15 DIAGNOSIS — Z1231 Encounter for screening mammogram for malignant neoplasm of breast: Secondary | ICD-10-CM

## 2021-05-16 ENCOUNTER — Telehealth: Payer: Self-pay | Admitting: *Deleted

## 2021-05-16 NOTE — Telephone Encounter (Signed)
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  Pt states she is currently undergoing workup with [facility].  Pt requesting advice from MD if okay to proceed with antifungal therapies if needed in the future while undergoing tx with Rituxan.  RN will review with MD.

## 2021-05-16 NOTE — Telephone Encounter (Signed)
RN reviewed with MD who verbalized okay for pt to proceed with antifungal therapy in the future if needed.

## 2021-05-20 ENCOUNTER — Other Ambulatory Visit: Payer: Self-pay

## 2021-05-20 ENCOUNTER — Telehealth: Payer: Self-pay

## 2021-05-20 ENCOUNTER — Encounter: Payer: Self-pay | Admitting: Oncology

## 2021-05-20 ENCOUNTER — Encounter: Payer: Self-pay | Admitting: Internal Medicine

## 2021-05-20 ENCOUNTER — Other Ambulatory Visit (HOSPITAL_COMMUNITY): Payer: Self-pay

## 2021-05-20 ENCOUNTER — Ambulatory Visit (INDEPENDENT_AMBULATORY_CARE_PROVIDER_SITE_OTHER): Payer: Medicare Other | Admitting: Internal Medicine

## 2021-05-20 DIAGNOSIS — B430 Cutaneous chromomycosis: Secondary | ICD-10-CM | POA: Diagnosis present

## 2021-05-20 MED ORDER — ITRACONAZOLE 10 MG/ML PO SOLN
200.0000 mg | Freq: Every day | ORAL | 5 refills | Status: DC
  Filled 2021-05-20: qty 600, 30d supply, fill #0

## 2021-05-20 NOTE — Progress Notes (Signed)
[name] for Infectious Disease  Reason for Consult: Chromoblastomycosis Referring Provider: Dr. Rolm Bookbinder  Assessment: Dana Butler has developed a very rare cutaneous fungal infection.  If the infection is isolated to her left elbow it has probably been cured through her recent excisional biopsy.  However, given that she has just started on rituximab for her lymphoma I favor treating her with itraconazole in case she has some persistent or disseminated infection.  She is in agreement with that plan.  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 Start April 10, 2021   ALPRAZOLAM Duanne Moron) 0.5 MG TABLET       CALCIUM CARBONATE (OS-CAL) 1250 (500 CA) MG CHEWABLE TABLET    Chew by mouth.   CHOLECALCIFEROL (VITAMIN D3) 5000 UNITS TABS    Take by mouth.   ESTRADIOL (VIVELLE-DOT) 0.05 MG/24HR PATCH    Place 1 patch onto the skin 2 (two) times a week.   GLUCOSAMINE-CHONDROITIN 500-400 MG TABLET    Take by mouth.   IBUPROFEN (ADVIL,MOTRIN) 200 MG TABLET    Take 200 mg by mouth.   LIDOCAINE-PRILOCAINE (EMLA) CREAM    Apply to affected area once   MULTIPLE VITAMINS-MINERALS (MULTIVITAMIN WITH MINERALS) TABLET    Take 1 tablet by mouth daily.   OMEGA-3 FATTY  ACIDS-VITAMIN E PO    1,000 capsules. Take 2 capsules by mouth daily.   TRIAMCINOLONE (KENALOG) 0.025 % OINTMENT    Apply 1 application topically 2 (two) times daily.   ZALEPLON (SONATA) 10 MG CAPSULE    Take 10 mg by mouth at bedtime.  Modified Medications   No medications on file  Discontinued Medications   No medications on file    HPI: Dana Butler is a 66 y.o. female who first noted a small pink lesion on her left elbow about 6 months ago.  She began using hydrocortisone cream on it but it has slowly enlarged leading to excisional biopsy several weeks ago.  Her dermatologist, Dr. Rolm Bookbinder, called me last week and informed me that the pathology report revealed chromoblastomycosis.  The biopsy site is healing nicely.  Shortly after the biopsy [name] began to notice a slightly pink, scaly area on her right knee.  She does not recall any specific trauma at either site.  She has had no recent travel outside of the [place].  She is fond of gardening in her own yard.  She was diagnosed with B cell non-Hodgkin's lymphoma in 2016.  She was just recently started on rituximab after she had had some further increase in her white blood cell count adenopathy.  She received her first dose 1 week ago.  Her oncologist is planning on seeing her every 3 weeks for 4 doses then reevaluating.  Review of Systems: Review of Systems  Constitutional:  Negative for chills, fever and weight loss.  Skin:  Positive for rash.     Past Medical History:  Diagnosis Date   Cancer ([place]) 2016   marginal zone non-hodgkins lymphoma   Medical history non-contributory     Social History   Tobacco Use   Smoking status: Never   Smokeless tobacco: Never  Substance Use Topics   Alcohol use: No   Drug use: No    Family History  Problem Relation Age of Onset   Breast cancer Paternal Aunt    Allergies  Allergen Reactions   Chloraprep One Step [Chlorhexidine Gluconate] Hives   Latex Rash   Tape Rash     OBJECTIVE: There were no vitals filed for this visit. There is no height or weight on file to calculate BMI.   Physical Exam Constitutional:      Comments: She is in good spirits.  Cardiovascular:     Rate and Rhythm: Normal rate.  Pulmonary:     Effort: Pulmonary effort is normal.  Skin:    Comments: She has a 10 mm healing biopsy site on her left elbow surrounded by a rim of erythema.  She has a very faint pink, scaly lesion on her right knee.    Left elbow biopsy site  Slightly pink, scaly lesion right knee  Microbiology: No results found for this or any previous visit (from the past 240 hour(s)).  Michel Bickers, MD [facility] [phone] pager   [phone] cell 05/20/2021, 2:07 PM

## 2021-05-20 NOTE — Telephone Encounter (Signed)
[facility] Patient Advocate Encounter   Received notification from [facility] that prior authorization for Itraconazole is required.   PA submitted on 05/20/21 Key [id number] Status is pending    [facility] will continue to follow.   Ileene Patrick, [facility] Patient [facility] for Infectious Disease Phone: [phone] Fax:  [phone]

## 2021-05-21 ENCOUNTER — Other Ambulatory Visit (HOSPITAL_COMMUNITY): Payer: Self-pay

## 2021-05-21 ENCOUNTER — Other Ambulatory Visit: Payer: Self-pay

## 2021-05-21 ENCOUNTER — Encounter: Payer: Self-pay | Admitting: Oncology

## 2021-05-21 ENCOUNTER — Telehealth: Payer: Self-pay

## 2021-05-21 DIAGNOSIS — B430 Cutaneous chromomycosis: Secondary | ICD-10-CM

## 2021-05-21 MED ORDER — ITRACONAZOLE 100 MG PO CAPS
200.0000 mg | ORAL_CAPSULE | Freq: Every day | ORAL | 2 refills | Status: DC
  Filled 2021-05-21: qty 30, 15d supply, fill #0

## 2021-05-21 NOTE — Telephone Encounter (Signed)
Attempted to reach patient regarding itraconazole copay. Left message asking her to call us back.

## 2021-05-21 NOTE — Telephone Encounter (Signed)
We were able to find assistance for itraconazole, but it only provides $300 total (which would barely cover a month's worth - see above copay). Obviously not as familiar with this treatment, but would you consider terbinafine? Happy to call patient and see if she would be willing to pay the copay. Sounds she would be on treatment for a while though. Let me know - thanks!

## 2021-05-21 NOTE — Telephone Encounter (Signed)
Patient called back. She is okay with the itraconazole capsules for $212/month with the $300 total assistance we found. She is curious about how exactly the patient assistance works, and I told her we will call her back once everything is settled and talk her through it. I also let her know that she should be taking 2 capsules daily with a full meal since she was originally counseled on the liquid.

## 2021-05-21 NOTE — Telephone Encounter (Signed)
[facility] Patient Advocate Encounter  Prior Authorization for Itraconazole has been approved.    PA# [id number] Effective dates: 05/20/21 through 04/26/22  Patients co-pay is $548.16 for the liquid.  Tablets $212.00  [facility] will continue to follow.  Ileene Patrick, [facility] Patient [facility] for Infectious Disease Phone: [phone] Fax:  [phone]

## 2021-05-21 NOTE — Telephone Encounter (Signed)
Patient is okay with paying $212/month and Rx was sent to [facility].

## 2021-05-21 NOTE — Telephone Encounter (Signed)
See above

## 2021-05-22 ENCOUNTER — Other Ambulatory Visit (HOSPITAL_COMMUNITY): Payer: Self-pay

## 2021-05-26 ENCOUNTER — Other Ambulatory Visit: Payer: 59

## 2021-05-27 ENCOUNTER — Other Ambulatory Visit (HOSPITAL_COMMUNITY): Payer: Self-pay

## 2021-05-27 NOTE — Progress Notes (Deleted)
Rapid Infusion Rituximab Pharmacist Evaluation  Dana Butler is a 66 y.o. female being treated with rituximab for ***. This patient {Actions; may/not:14603} be considered for RIR.   A pharmacist has verified the patient tolerated rituximab infusions per the [facility] standard infusion protocol without grade 3-4 infusion reactions. The treatment plan will be updated to reflect RIR if the patient qualifies per the checklist below:   Age > 21 years old {YES/NO:21197}  Clinically significant cardiovascular disease {YES/NO:21197}  Circulating lymphocyte count < 5000/uL prior to cycle two {YES/NO:21197} Lab Results  Component Value Date   LYMPHSABS 82.4 (H) 05/13/2021    Prior documented grade 3-4 infusion reaction to rituximab {YES/NO:21197}  Prior documented grade 1-2 infusion reaction to rituximab (If YES, Pharmacist will confirm with Physician if patient is still a candidate for RIR) {YES/NO:21197}  Previous rituximab infusion within the past 6 months {YES/NO:21197}  Treatment Plan updated orders to reflect RIR {YES/NO:21197}   [name] {DOES/DOES NOT:200015} meet the criteria for Rapid Infusion Rituximab. This patient {ACTION; IS/IS [id number]} going to be switched to rapid infusion rituximab.   [name] 05/27/21 11:40 AM

## 2021-05-29 ENCOUNTER — Ambulatory Visit: Payer: 59 | Admitting: Hematology and Oncology

## 2021-06-02 NOTE — Progress Notes (Signed)
Patient Care Team: Sueanne Margarita, DO as PCP - General (Internal Medicine) Jodi Marble, MD as Consulting Physician (Otolaryngology) Armandina Gemma, MD as Consulting Physician (General Surgery) Kennith Center, RD as Dietitian (Family Medicine)  DIAGNOSIS:    ICD-10-CM   1. 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[place])  06/29/2014 Initial Diagnosis   left axillary lymph node biopsy 06/29/2014 for a B-cell non-Hodgkin's lymphoma, consistent with marginal zone nodal lymphoma, advanced stage   07/19/2014 Miscellaneous   PET scanning which showed bilateral cervical adenopathy, bilateral axillary adenopathy, some small left common iliac lymph nodes and a normal spleen   04/12/2018 Pathology Results   biopsy of a right breast mass 04/12/2018 showed 89% of the cells to be lambda restricted, with immunophenotype consistent with marginal zone non-Hodgkin's lymphoma (CD19, 20 and 23+)   05/13/2021 -  Chemotherapy   Patient is on Treatment Plan : NON-HODGKINS LYMPHOMA Rituximab q21d       CHIEF COMPLIANT: Cycle 2 Rituximab   INTERVAL HISTORY: Dana Butler is a 66 y.o. with above-mentioned history of nodal marginal zone non-Hodgkin's lymphoma, currently on treatment with Rituximab. She presents to the clinic today for treatment.  ALLERGIES:  is allergic to chloraprep one step [chlorhexidine gluconate], latex, and tape.  MEDICATIONS:  Current Outpatient Medications  Medication Sig Dispense Refill   allopurinol (ZYLOPRIM) 300 MG tablet Take 1 tablet (300 mg total) by mouth daily. Start April 10, 2021 90 tablet 3   ALPRAZolam Duanne Moron) 0.5 MG tablet  (Patient not taking: Reported on 05/20/2021)     calcium carbonate (OS-CAL) 1250 (500 Ca) MG chewable tablet Chew by mouth.     Cholecalciferol (VITAMIN D3) 5000 UNITS TABS Take by mouth.     estradiol (VIVELLE-DOT) 0.05 MG/24HR patch Place 1 patch onto  the skin 2 (two) times a week.     ferrous gluconate (FERGON) 324 MG tablet 1 tablet with water or juice between meals     glucosamine-chondroitin 500-400 MG tablet Take by mouth.     ibuprofen (ADVIL,MOTRIN) 200 MG tablet Take 200 mg by mouth.     itraconazole (SPORANOX) 100 MG capsule Take 2 capsules (200 mg total) by mouth daily. 30 capsule 2   lidocaine-prilocaine (EMLA) cream Apply to affected area once (Patient not taking: Reported on 05/20/2021) 30 g 3   Multiple Vitamins-Minerals (MULTIVITAMIN WITH MINERALS) tablet Take 1 tablet by mouth daily.     OMEGA-3 FATTY ACIDS-VITAMIN E PO 1,000 capsules. Take 2 capsules by mouth daily.     riTUXimab (RITUXAN) 100 MG/10ML injection See admin instructions.     triamcinolone (KENALOG) 0.025 % ointment Apply 1 application topically 2 (two) times daily. 30 g 0   Turmeric 450 MG CAPS See admin instructions.     zaleplon (SONATA) 10 MG capsule Take 10 mg by mouth at bedtime.     No current facility-administered medications for this visit.    PHYSICAL EXAMINATION: ECOG PERFORMANCE STATUS: 1 - Symptomatic but completely ambulatory  Vitals:   06/03/21 0824  BP: (!) 122/55  Pulse: 71  Temp: 97.9 F (36.6 C)  SpO2: 99%   Filed Weights   06/03/21 0824  Weight: 130 lb 6.4 oz (59.1 kg)    LABORATORY DATA:  I have reviewed the data as listed CMP Latest Ref Rng & Units 05/13/2021 04/15/2021 03/27/2021  Glucose 70 - 99 mg/dL 96 77 100(H)  BUN 8 -  23 mg/dL 18 14 11   Creatinine 0.44 - 1.00 mg/dL 1.00 0.76 0.82  Sodium 135 - 145 mmol/L 138 139 141  Potassium 3.5 - 5.1 mmol/L 4.3 3.8 4.4  Chloride 98 - 111 mmol/L 107 104 108  CO2 22 - 32 mmol/L 24 29 24   Calcium 8.9 - 10.3 mg/dL 9.2 10.2 9.2  Total Protein 6.5 - 8.1 g/dL 6.2(L) 6.7 6.3(L)  Total Bilirubin 0.3 - 1.2 mg/dL 0.5 0.7 0.4  Alkaline Phos 38 - 126 U/L 169(H) 176(H) 185(H)  AST 15 - 41 U/L 31 31 30   ALT 0 - 44 U/L 19 18 17     Lab Results  Component Value Date   WBC 6.1 06/03/2021    HGB 10.7 (L) 06/03/2021   HCT 32.4 (L) 06/03/2021   MCV 94.2 06/03/2021   PLT 268 06/03/2021   NEUTROABS 2.1 06/03/2021    ASSESSMENT & PLAN:  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[place]) 2016: left axillary lymph node biopsy 06/29/2014 for a B-cell non-Hodgkin's lymphoma, consistent with marginal zone nodal lymphoma, advanced stage 2016: PET scanning which showed bilateral cervical adenopathy, bilateral axillary adenopathy, some small left common iliac lymph nodes and a normal spleen 2019: biopsy of a right breast mass 04/12/2018 showed 89% of the cells to be lambda restricted, with immunophenotype consistent with marginal zone non-Hodgkin's lymphoma (CD19, 20 and 23+) ----------------------------------------------------------------------------------------------------------------------------  Current Treatment: Rituxan every 3 weeks x4 cycles starting 05/13/2021 (we will may consider maintenance Rituxan every 3 months for 2 years), today cycle 2 Rituxan toxicities: Did not have any infusion reactions. She kept up her fluid intake. We will add 500 mL of saline to each of her infusions.  Lab review: WBC count improved to 6.1 from 89. Remarkable improvement in these counts. Slight anemia still persistent.  Return to clinic in 3 weeks for cycle 3    No orders of the defined types were placed in this encounter.  The patient has a good understanding of the overall plan. she agrees with it. she will call with any problems that may develop before the next visit here.  Total time spent: 30 mins including face to face time and time spent for planning, charting and coordination of care  Rulon Eisenmenger, MD, MPH 06/03/2021  I, Thana Ates, am acting as scribe for Dr. Nicholas Lose.  I have reviewed the above documentation for accuracy and completeness, and I agree with the above.

## 2021-06-03 ENCOUNTER — Ambulatory Visit: Payer: 59 | Admitting: Internal Medicine

## 2021-06-03 ENCOUNTER — Other Ambulatory Visit (HOSPITAL_COMMUNITY): Payer: Self-pay

## 2021-06-03 ENCOUNTER — Inpatient Hospital Stay: Payer: Medicare Other | Attending: Oncology

## 2021-06-03 ENCOUNTER — Inpatient Hospital Stay: Payer: Medicare Other

## 2021-06-03 ENCOUNTER — Encounter: Payer: Self-pay | Admitting: Internal Medicine

## 2021-06-03 ENCOUNTER — Inpatient Hospital Stay (HOSPITAL_BASED_OUTPATIENT_CLINIC_OR_DEPARTMENT_OTHER): Payer: Medicare Other | Admitting: Hematology and Oncology

## 2021-06-03 ENCOUNTER — Ambulatory Visit (INDEPENDENT_AMBULATORY_CARE_PROVIDER_SITE_OTHER): Payer: Medicare Other | Admitting: Internal Medicine

## 2021-06-03 ENCOUNTER — Other Ambulatory Visit: Payer: Self-pay | Admitting: Hematology and Oncology

## 2021-06-03 ENCOUNTER — Other Ambulatory Visit: Payer: Self-pay

## 2021-06-03 VITALS — BP 111/63 | HR 67 | Temp 97.7°F | Resp 16

## 2021-06-03 DIAGNOSIS — B430 Cutaneous chromomycosis: Secondary | ICD-10-CM

## 2021-06-03 DIAGNOSIS — Z5112 Encounter for antineoplastic immunotherapy: Secondary | ICD-10-CM | POA: Diagnosis not present

## 2021-06-03 DIAGNOSIS — C8580 Other specified types of non-Hodgkin lymphoma, unspecified site: Secondary | ICD-10-CM | POA: Insufficient documentation

## 2021-06-03 LAB — CBC WITH DIFFERENTIAL (CANCER CENTER ONLY)
Abs Immature Granulocytes: 0.01 10*3/uL (ref 0.00–0.07)
Basophils Absolute: 0.1 10*3/uL (ref 0.0–0.1)
Basophils Relative: 1 %
Eosinophils Absolute: 0.3 10*3/uL (ref 0.0–0.5)
Eosinophils Relative: 5 %
HCT: 32.4 % — ABNORMAL LOW (ref 36.0–46.0)
Hemoglobin: 10.7 g/dL — ABNORMAL LOW (ref 12.0–15.0)
Immature Granulocytes: 0 %
Lymphocytes Relative: 55 %
Lymphs Abs: 3.4 10*3/uL (ref 0.7–4.0)
MCH: 31.1 pg (ref 26.0–34.0)
MCHC: 33 g/dL (ref 30.0–36.0)
MCV: 94.2 fL (ref 80.0–100.0)
Monocytes Absolute: 0.3 10*3/uL (ref 0.1–1.0)
Monocytes Relative: 4 %
Neutro Abs: 2.1 10*3/uL (ref 1.7–7.7)
Neutrophils Relative %: 35 %
Platelet Count: 268 10*3/uL (ref 150–400)
RBC: 3.44 MIL/uL — ABNORMAL LOW (ref 3.87–5.11)
RDW: 17.6 % — ABNORMAL HIGH (ref 11.5–15.5)
WBC Count: 6.1 10*3/uL (ref 4.0–10.5)
nRBC: 0 % (ref 0.0–0.2)

## 2021-06-03 LAB — CMP (CANCER CENTER ONLY)
ALT: 19 U/L (ref 0–44)
AST: 28 U/L (ref 15–41)
Albumin: 3.9 g/dL (ref 3.5–5.0)
Alkaline Phosphatase: 229 U/L — ABNORMAL HIGH (ref 38–126)
Anion gap: 6 (ref 5–15)
BUN: 18 mg/dL (ref 8–23)
CO2: 24 mmol/L (ref 22–32)
Calcium: 8.6 mg/dL — ABNORMAL LOW (ref 8.9–10.3)
Chloride: 109 mmol/L (ref 98–111)
Creatinine: 0.71 mg/dL (ref 0.44–1.00)
GFR, Estimated: >60 mL/min (ref >60)
Glucose, Bld: 105 mg/dL — ABNORMAL HIGH (ref 70–99)
Potassium: 4.1 mmol/L (ref 3.5–5.1)
Sodium: 139 mmol/L (ref 135–145)
Total Bilirubin: 0.5 mg/dL (ref 0.3–1.2)
Total Protein: 6.1 g/dL — ABNORMAL LOW (ref 6.5–8.1)

## 2021-06-03 LAB — LACTATE DEHYDROGENASE: LDH: 191 U/L (ref 98–192)

## 2021-06-03 LAB — URIC ACID: Uric Acid, Serum: 3.1 mg/dL (ref 2.5–7.1)

## 2021-06-03 MED ORDER — ACETAMINOPHEN 325 MG PO TABS
650.0000 mg | ORAL_TABLET | Freq: Once | ORAL | Status: AC
  Administered 2021-06-03: 650 mg via ORAL
  Filled 2021-06-03: qty 2

## 2021-06-03 MED ORDER — ITRACONAZOLE 100 MG PO CAPS
200.0000 mg | ORAL_CAPSULE | Freq: Every day | ORAL | 2 refills | Status: DC
  Filled 2021-06-03: qty 60, 30d supply, fill #0

## 2021-06-03 MED ORDER — SODIUM CHLORIDE 0.9 % IV SOLN
375.0000 mg/m2 | Freq: Once | INTRAVENOUS | Status: AC
  Administered 2021-06-03: 600 mg via INTRAVENOUS
  Filled 2021-06-03: qty 50

## 2021-06-03 MED ORDER — DIPHENHYDRAMINE HCL 25 MG PO CAPS
50.0000 mg | ORAL_CAPSULE | Freq: Once | ORAL | Status: AC
  Administered 2021-06-03: 50 mg via ORAL
  Filled 2021-06-03: qty 2

## 2021-06-03 MED ORDER — SODIUM CHLORIDE 0.9 % IV SOLN: Freq: Once | INTRAVENOUS | Status: AC

## 2021-06-03 NOTE — Progress Notes (Addendum)
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 Start April 10, 2021   ALPRAZOLAM Duanne Moron) 0.5 MG TABLET       CALCIUM CARBONATE (OS-CAL) 1250 (500 CA) MG CHEWABLE TABLET    Chew by mouth.   CHOLECALCIFEROL (VITAMIN D3) 5000 UNITS TABS    Take by mouth.   ESTRADIOL (VIVELLE-DOT) 0.05 MG/24HR PATCH    Place 1 patch onto the skin 2 (two) times a week.   FERROUS GLUCONATE (FERGON) 324 MG TABLET    1 tablet with water or juice between meals   GLUCOSAMINE-CHONDROITIN 500-400 MG TABLET    Take by mouth.   IBUPROFEN (ADVIL,MOTRIN) 200 MG TABLET    Take 200 mg by mouth.   LIDOCAINE-PRILOCAINE (EMLA) CREAM    Apply to affected area once   MULTIPLE VITAMINS-MINERALS (MULTIVITAMIN WITH MINERALS) TABLET    Take 1 tablet by mouth daily.   OMEGA-3 FATTY ACIDS-VITAMIN E PO    1,000 capsules. Take 2 capsules by mouth daily.   RITUXIMAB (RITUXAN) 100 MG/10ML INJECTION    See admin instructions.   TRIAMCINOLONE (KENALOG) 0.025 % OINTMENT    Apply 1 application topically 2 (two) times daily.   TURMERIC 450 MG CAPS    See admin instructions.   ZALEPLON (SONATA) 10 MG CAPSULE    Take 10 mg by mouth at bedtime.  Modified Medications   Modified Medication Previous Medication   ITRACONAZOLE (SPORANOX) 100 MG CAPSULE itraconazole (SPORANOX) 100 MG capsule      Take 2 capsules  by mouth daily.    Take 2 capsules (200 mg total) by mouth daily.  Discontinued Medications   No medications on file    Subjective: Dana Butler is in  for her routine follow-up visit.  She started itraconazole 2 weeks ago for her isolated cutaneous chroma blastomycosis.  She has not had any problems tolerate it.  Her left elbow biopsy site is healing slowly but nicely.  The area on her right knee that she was concerned about has resolved.  About 1 week ago she noticed a small "scratch" on her right elbow.   She does not know what caused it.  She thinks it is getting smaller.  Review of Systems: Review of Systems  Gastrointestinal:  Negative for abdominal pain, diarrhea, nausea and vomiting.   Past Medical History:  Diagnosis Date   Cancer ([place]) 2016   marginal zone non-hodgkins lymphoma   Medical history non-contributory     Social History   Tobacco Use   Smoking status: Never   Smokeless tobacco: Never  Substance Use Topics   Alcohol use: No   Drug use: No    Family History  Problem Relation Age of Onset   Breast cancer Paternal Aunt     Allergies  Allergen Reactions   Chloraprep One Step [Chlorhexidine Gluconate] Hives   Latex Rash   Tape Rash    Objective: Vitals:   06/03/21 1406  BP: 103/63  Pulse: 90  Temp: 97.8 F (36.6 C)  TempSrc: Oral  Weight: 128 lb 12.8 oz (58.4 kg)   Body mass index is 23.56 kg/m.  Physical Exam Constitutional:      Comments: She is in good spirits.  Skin:    Comments: Her left elbow biopsy site is healing slowly.  She has a very nonspecific small excoriated on her right elbow.  The slight erythematous area on her right knee has resolved.    Lab Results    Problem List Items Addressed This Visit       High   Chromoblastomycosis    She is tolerating itraconazole well.  I doubt that the small lesion on her right elbow is a new area of clinical blastocysts but this will need to be followed closely.  I will check her serum itraconazole level ([id number]) today and see her back in 4 weeks.      Relevant Medications   itraconazole (SPORANOX) 100 MG capsule   Other Relevant  Orders   Miscellaneous test (send-out)   ITRACONAZOLE LEVEL, HPLC     Michel Bickers, MD [facility] [place] [phone] pager   [phone] cell 06/03/2021, 4:22 PM

## 2021-06-03 NOTE — Assessment & Plan Note (Signed)
2016: left axillary lymph node biopsy 06/29/2014 for a B-cell non-Hodgkin's lymphoma, consistent with marginal zone nodal lymphoma, advanced stage 2016: PET scanning which showed bilateral cervical adenopathy, bilateral axillary adenopathy, some small left common iliac lymph nodes and a normal spleen 2019: biopsy of a right breast mass 04/12/2018 showed 89% of the cells to be lambda restricted, with immunophenotype consistent with marginal zone non-Hodgkin's lymphoma (CD19, 20 and 23+) ---------------------------------------------------------------------------------------------------------------------------- Current Treatment: Rituxan every 3 weeks x4 cycles starting 05/13/2021 (we will may consider maintenance Rituxan every 3 months for 2 years), today cycle 2 Rituxan toxicities:  Return to clinic in 3 weeks for cycle 3

## 2021-06-03 NOTE — Assessment & Plan Note (Addendum)
She is tolerating itraconazole well.  I doubt that the small lesion on her right elbow is a new area of clinical blastocysts but this will need to be followed closely.  I will check her serum itraconazole level ([id number]) today and see her back in 4 weeks.

## 2021-06-03 NOTE — Patient Instructions (Signed)
[facility]COL[facility]   Discharge Instructions: Thank you for choosing [facility] to provide your oncology and hematology care.   If you have a lab appointment with the Cancer Center, please go directly to the Cancer Center and check in at the registration area.   Wear comfortable clothing and clothing appropriate for easy access to any Portacath or PICC line.   We strive to give you quality time with your provider. You may need to reschedule your appointment if you arrive late (15 or more minutes).  Arriving late affects you and other patients whose appointments are after yours.  Also, if you miss three or more appointments without notifying the office, you may be dismissed from the clinic at the provider's discretion.      For prescription refill requests, have your pharmacy contact our office and allow 72 hours for refills to be completed.    Today you received the following chemotherapy and/or immunotherapy agents Rituximab (Ruxience).      To help prevent nausea and vomiting after your treatment, we encourage you to take your nausea medication as directed.  BELOW ARE SYMPTOMS THAT SHOULD BE REPORTED IMMEDIATELY: *FEVER GREATER THAN 100.4 F (38 C) OR HIGHER *CHILLS OR SWEATING *NAUSEA AND VOMITING THAT IS NOT CONTROLLED WITH YOUR NAUSEA MEDICATION *UNUSUAL SHORTNESS OF BREATH *UNUSUAL BRUISING OR BLEEDING *URINARY PROBLEMS (pain or burning when urinating, or frequent urination) *BOWEL PROBLEMS (unusual diarrhea, constipation, pain near the anus) TENDERNESS IN MOUTH AND THROAT WITH OR WITHOUT PRESENCE OF ULCERS (sore throat, sores in mouth, or a toothache) UNUSUAL RASH, SWELLING OR PAIN  UNUSUAL VAGINAL DISCHARGE OR ITCHING   Items with * indicate a potential emergency and should be followed up as soon as possible or go to the Emergency Department if any problems should occur.  Please show the CHEMOTHERAPY ALERT CARD or IMMUNOTHERAPY ALERT CARD at  check-in to the Emergency Department and triage nurse.  Should you have questions after your visit or need to cancel or reschedule your appointment, please contact [facility]  Dept: [phone]  and follow the prompts.  Office hours are 8:00 a.m. to 4:30 p.m. Monday - Friday. Please note that voicemails left after 4:00 p.m. may not be returned until the following business day.  We are closed weekends and major holidays. You have access to a nurse at all times for urgent questions. Please call the main number to the clinic Dept: [phone] and follow the prompts.   For any non-urgent questions, you may also contact your provider using MyChart. We now offer e-Visits for anyone 18 and older to request care online for non-urgent symptoms. For details visit [email or web address].   Also download the MyChart app! Go to the app store, search "MyChart", open the app, select [facility], and log in with your MyChart username and password.  Due to Covid, a mask is required upon entering the hospital/clinic. If you do not have a mask, one will be given to you upon arrival. For doctor visits, patients may have 1 support person aged 18 or older with them. For treatment visits, patients cannot have anyone with them due to current Covid guidelines and our immunocompromised population.   

## 2021-06-04 ENCOUNTER — Other Ambulatory Visit (HOSPITAL_COMMUNITY): Payer: Self-pay

## 2021-06-04 LAB — IGG, IGA, IGM
IgA: 100 mg/dL (ref 87–352)
IgG (Immunoglobin G), Serum: 681 mg/dL (ref 586–1602)
IgM (Immunoglobulin M), Srm: 20 mg/dL — ABNORMAL LOW (ref 26–217)

## 2021-06-04 LAB — BETA 2 MICROGLOBULIN, SERUM: Beta-2 Microglobulin: 3.3 mg/L — ABNORMAL HIGH (ref 0.6–2.4)

## 2021-06-10 ENCOUNTER — Other Ambulatory Visit: Payer: Self-pay | Admitting: Internal Medicine

## 2021-06-10 ENCOUNTER — Encounter: Payer: Self-pay | Admitting: Internal Medicine

## 2021-06-10 ENCOUNTER — Other Ambulatory Visit (HOSPITAL_COMMUNITY): Payer: Self-pay

## 2021-06-10 ENCOUNTER — Other Ambulatory Visit: Payer: Self-pay | Admitting: Pharmacist

## 2021-06-10 DIAGNOSIS — B430 Cutaneous chromomycosis: Secondary | ICD-10-CM

## 2021-06-10 LAB — ITRACONAZOLE LEVEL, HPLC
Itraconazole Lvl: 0.48 ug/mL
Results Received: 0.78 ug/mL

## 2021-06-10 MED ORDER — ITRACONAZOLE 100 MG PO CAPS
200.0000 mg | ORAL_CAPSULE | Freq: Two times a day (BID) | ORAL | 2 refills | Status: DC
  Filled 2021-06-10 – 2021-07-07 (×2): qty 120, 30d supply, fill #0

## 2021-06-18 NOTE — Progress Notes (Signed)
Rapid Infusion Rituximab Pharmacist Evaluation ? ?Dana Butler is a 66 y.o. female being treated with rituximab for 06/25/21. This patient may be considered for RIR.  ? ?A pharmacist has verified the patient tolerated rituximab infusions per the [facility] standard infusion protocol without grade 3-4 infusion reactions. The treatment plan will be updated to reflect RIR if the patient qualifies per the checklist below:  ? ?Age > 89 years old Yes   ?Clinically significant cardiovascular disease No   ?Circulating lymphocyte count < 5000/uL prior to cycle two Yes  ?Lab Results  ?Component Value Date  ? LYMPHSABS 3.4 06/03/2021  ?  ?Prior documented grade 3-4 infusion reaction to rituximab No   ?Prior documented grade 1-2 infusion reaction to rituximab (If YES, Pharmacist will confirm with Physician if patient is still a candidate for RIR) Yes- dizzy post infusion.  Md Noted in his OV notes no infusion reaction.  Also patient stated she stood up too fast with first cycle, second cycle no issues.  ?Previous rituximab infusion within the past 6 months No   ?Treatment Plan updated orders to reflect RIR Yes   ? ?[place] does meet the criteria for Rapid Infusion Rituximab. This patient is going to be switched to rapid infusion rituximab.  ? ?[name] ?06/18/21 4:25 PM  ?

## 2021-06-24 ENCOUNTER — Other Ambulatory Visit: Payer: Self-pay | Admitting: Hematology and Oncology

## 2021-06-24 NOTE — Progress Notes (Signed)
? ?Patient Care Team: ?Sueanne Margarita, DO as PCP - General (Internal Medicine) ?Jodi Marble, MD as Consulting Physician (Otolaryngology) ?Armandina Gemma, MD as Consulting Physician (General Surgery) ?Kennith Center, RD as Dietitian (Family Medicine) ? ?DIAGNOSIS:  ?  ICD-10-CM   ?1. 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[facility])  C85.80   ?  ? ? 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 left axillary lymph node biopsy 06/29/2014 for a B-cell non-Hodgkin's lymphoma, consistent with marginal zone nodal lymphoma, advanced stage ?  ?07/19/2014 Miscellaneous  ? PET scanning which showed bilateral cervical adenopathy, bilateral axillary adenopathy, some small left common iliac lymph nodes and a normal spleen ?  ?04/12/2018 Pathology Results  ? biopsy of a right breast mass 04/12/2018 showed 89% of the cells to be lambda restricted, with immunophenotype consistent with marginal zone non-Hodgkin's lymphoma (CD19, 20 and 23+) ?  ?05/13/2021 -  Chemotherapy  ? Patient is on Treatment Plan : NON-HODGKINS LYMPHOMA Rituximab q21d  ?   ? ? ?CHIEF COMPLIANT: Cycle 3 Rituximab  ? ?INTERVAL HISTORY: Dana Butler is a 66 y.o. with above-mentioned history of nodal marginal zone non-Hodgkin's lymphoma, currently on treatment with Rituximab. She presents to the clinic today for treatment. ?She is tolerating rituximab extremely well without any problems or concerns.  Denies any fatigue or nausea or vomiting does note slight tenderness or fevers or chills. ? ?ALLERGIES:  is allergic to chloraprep one step [chlorhexidine gluconate], latex, and tape. ? ?MEDICATIONS:  ?Current Outpatient Medications  ?Medication Sig Dispense Refill  ? allopurinol (ZYLOPRIM) 300 MG tablet Take 1 tablet (300 mg total) by mouth daily. Start April 10, 2021 90 tablet 3  ? ALPRAZolam (XANAX) 0.5 MG tablet  (Patient not taking: Reported on 05/20/2021)    ? calcium carbonate (OS-CAL) 1250 (500 Ca)  MG chewable tablet Chew by mouth.    ? Cholecalciferol (VITAMIN D3) 5000 UNITS TABS Take by mouth.    ? estradiol (VIVELLE-DOT) 0.05 MG/24HR patch Place 1 patch onto the skin 2 (two) times a week.    ? ferrous gluconate (FERGON) 324 MG tablet 1 tablet with water or juice between meals    ? glucosamine-chondroitin 500-400 MG tablet Take by mouth.    ? ibuprofen (ADVIL,MOTRIN) 200 MG tablet Take 200 mg by mouth.    ? itraconazole (SPORANOX) 100 MG capsule Take 2 capsules (200 mg total) by mouth 2 (two) times daily. 120 capsule 2  ? Multiple Vitamins-Minerals (MULTIVITAMIN WITH MINERALS) tablet Take 1 tablet by mouth daily.    ? OMEGA-3 FATTY ACIDS-VITAMIN E PO 1,000 capsules. Take 2 capsules by mouth daily.    ? riTUXimab (RITUXAN) 100 MG/10ML injection See admin instructions.    ? zaleplon (SONATA) 10 MG capsule Take 10 mg by mouth at bedtime.    ? ?No current facility-administered medications for this visit.  ? ? ?PHYSICAL EXAMINATION: ?ECOG PERFORMANCE STATUS: 1 - Symptomatic but completely ambulatory ? ?Vitals:  ? 06/25/21 0823  ?BP: (!) 108/51  ?Pulse: 66  ?Resp: 17  ?Temp: (!) 97.2 ?F (36.2 ?C)  ?SpO2: 98%  ? ?Filed Weights  ? 06/25/21 0823  ?Weight: 121 lb 6.4 oz (55.1 kg)  ? ? ?LABORATORY DATA:  ?I have reviewed the data as listed ?CMP Latest Ref Rng & Units 06/03/2021 05/13/2021 04/15/2021  ?Glucose 70 - 99 mg/dL 105(H) 96 77  ?BUN 8 - 23 mg/dL 18 18 14   ?Creatinine 0.44 - 1.00 mg/dL 0.71 1.00 0.76  ?  Sodium 135 - 145 mmol/L 139 138 139  ?Potassium 3.5 - 5.1 mmol/L 4.1 4.3 3.8  ?Chloride 98 - 111 mmol/L 109 107 104  ?CO2 22 - 32 mmol/L 24 24 29   ?Calcium 8.9 - 10.3 mg/dL 8.6(L) 9.2 10.2  ?Total Protein 6.5 - 8.1 g/dL 6.1(L) 6.2(L) 6.7  ?Total Bilirubin 0.3 - 1.2 mg/dL 0.5 0.5 0.7  ?Alkaline Phos 38 - 126 U/L 229(H) 169(H) 176(H)  ?AST 15 - 41 U/L 28 31 31   ?ALT 0 - 44 U/L 19 19 18   ? ? ?Lab Results  ?Component Value Date  ? WBC 3.7 (L) 06/25/2021  ? HGB 11.6 (L) 06/25/2021  ? HCT 33.9 (L) 06/25/2021  ? MCV 93.4  06/25/2021  ? PLT 250 06/25/2021  ? NEUTROABS 2.0 06/25/2021  ? ? 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[place]) ?2016: left axillary lymph node biopsy 06/29/2014 for a B-cell non-Hodgkin's lymphoma, consistent with marginal zone nodal lymphoma, advanced stage ?2016: PET scanning which showed bilateral cervical adenopathy, bilateral axillary adenopathy, some small left common iliac lymph nodes and a normal spleen ?2019: biopsy of a right breast mass 04/12/2018 showed 89% of the cells to be lambda restricted, with immunophenotype consistent with marginal zone non-Hodgkin's lymphoma (CD19, 20 and 23+) ?----------------------------------------------------------------------------------------------------------------------------  ?Current Treatment: Rituxan every 3 weeks x4 cycles starting 05/13/2021 (we will may consider maintenance Rituxan every 3 months for 2 years), today cycle 3 ?Rituxan toxicities: ?Did not have any infusion reactions. ?  ?We will add 500 mL of saline to each of her infusions. ?  ?Lab review: WBC count improved to 3.7 from 89. ?Improvement in the hemoglobin to 11.6. ?  ?Return to clinic in 3 weeks for cycle 4 and then we will make a decision regarding maintenance therapy.  Potentially we could start maintenance Rituxan every 3 months x 2 years ? ? ? ?No orders of the defined types were placed in this encounter. ? ?The patient has a good understanding of the overall plan. she agrees with it. she will call with any problems that may develop before the next visit here. ? ?Total time spent: 30 mins including face to face time and time spent for planning, charting and coordination of care ? ?Rulon Eisenmenger, MD, MPH ?06/25/2021 ? ?I, Thana Ates, am acting as scribe for Dr. Nicholas Lose. ? ?I have reviewed the above documentation for accuracy and completeness, and I agree with the above. ? ? ? ? ? ? ?

## 2021-06-24 NOTE — Assessment & Plan Note (Signed)
2016:left axillary lymph node biopsy 06/29/2014 for a B-cell non-Hodgkin's lymphoma, consistent with marginal zone nodal lymphoma, advanced stage 2016:PET scanning which showed bilateral cervical adenopathy, bilateral axillary adenopathy, some small left common iliac lymph nodes and a normal spleen 2019:biopsy of a right breast mass 04/12/2018 showed 89% of the cells to be lambda restricted, with immunophenotype consistent with marginal zone non-Hodgkin's lymphoma (CD19, 20 and 23+) ---------------------------------------------------------------------------------------------------------------------------- Current Treatment:Rituxan every 3 weeks x4 cycles starting 05/13/2021 (we will may consider maintenance Rituxan every 3 months for 2 years), today cycle 3 Rituxan toxicities: Did not have any infusion reactions. She kept up her fluid intake. We will add 500 mL of saline to each of her infusions.  Lab review: WBC count improved to 6.1 from 89. Remarkable improvement in these counts. Slight anemia still persistent.  Return to clinic in 3 weeks for cycle 4

## 2021-06-25 ENCOUNTER — Other Ambulatory Visit: Payer: Self-pay

## 2021-06-25 ENCOUNTER — Inpatient Hospital Stay: Payer: Medicare Other | Attending: Oncology

## 2021-06-25 ENCOUNTER — Inpatient Hospital Stay (HOSPITAL_BASED_OUTPATIENT_CLINIC_OR_DEPARTMENT_OTHER): Payer: Medicare Other | Admitting: Hematology and Oncology

## 2021-06-25 ENCOUNTER — Inpatient Hospital Stay: Payer: Medicare Other

## 2021-06-25 VITALS — BP 101/52 | HR 61 | Temp 97.8°F | Resp 18

## 2021-06-25 DIAGNOSIS — C8580 Other specified types of non-Hodgkin lymphoma, unspecified site: Secondary | ICD-10-CM | POA: Insufficient documentation

## 2021-06-25 DIAGNOSIS — Z5112 Encounter for antineoplastic immunotherapy: Secondary | ICD-10-CM | POA: Insufficient documentation

## 2021-06-25 LAB — CMP (CANCER CENTER ONLY)
ALT: 16 U/L (ref 0–44)
AST: 27 U/L (ref 15–41)
Albumin: 3.9 g/dL (ref 3.5–5.0)
Alkaline Phosphatase: 169 U/L — ABNORMAL HIGH (ref 38–126)
Anion gap: 6 (ref 5–15)
BUN: 10 mg/dL (ref 8–23)
CO2: 26 mmol/L (ref 22–32)
Calcium: 9.2 mg/dL (ref 8.9–10.3)
Chloride: 106 mmol/L (ref 98–111)
Creatinine: 0.66 mg/dL (ref 0.44–1.00)
GFR, Estimated: >60 mL/min (ref >60)
Glucose, Bld: 99 mg/dL (ref 70–99)
Potassium: 4.1 mmol/L (ref 3.5–5.1)
Sodium: 138 mmol/L (ref 135–145)
Total Bilirubin: 0.8 mg/dL (ref 0.3–1.2)
Total Protein: 6.1 g/dL — ABNORMAL LOW (ref 6.5–8.1)

## 2021-06-25 LAB — URIC ACID: Uric Acid, Serum: 3.5 mg/dL (ref 2.5–7.1)

## 2021-06-25 LAB — LACTATE DEHYDROGENASE: LDH: 169 U/L (ref 98–192)

## 2021-06-25 LAB — CBC WITH DIFFERENTIAL (CANCER CENTER ONLY)
Abs Immature Granulocytes: 0.01 10*3/uL (ref 0.00–0.07)
Basophils Absolute: 0 10*3/uL (ref 0.0–0.1)
Basophils Relative: 1 %
Eosinophils Absolute: 0.1 10*3/uL (ref 0.0–0.5)
Eosinophils Relative: 3 %
HCT: 33.9 % — ABNORMAL LOW (ref 36.0–46.0)
Hemoglobin: 11.6 g/dL — ABNORMAL LOW (ref 12.0–15.0)
Immature Granulocytes: 0 %
Lymphocytes Relative: 34 %
Lymphs Abs: 1.3 10*3/uL (ref 0.7–4.0)
MCH: 32 pg (ref 26.0–34.0)
MCHC: 34.2 g/dL (ref 30.0–36.0)
MCV: 93.4 fL (ref 80.0–100.0)
Monocytes Absolute: 0.3 10*3/uL (ref 0.1–1.0)
Monocytes Relative: 8 %
Neutro Abs: 2 10*3/uL (ref 1.7–7.7)
Neutrophils Relative %: 54 %
Platelet Count: 250 10*3/uL (ref 150–400)
RBC: 3.63 MIL/uL — ABNORMAL LOW (ref 3.87–5.11)
RDW: 14.8 % (ref 11.5–15.5)
WBC Count: 3.7 10*3/uL — ABNORMAL LOW (ref 4.0–10.5)
nRBC: 0 % (ref 0.0–0.2)

## 2021-06-25 MED ORDER — ACETAMINOPHEN 325 MG PO TABS
650.0000 mg | ORAL_TABLET | Freq: Once | ORAL | Status: AC
  Administered 2021-06-25: 650 mg via ORAL
  Filled 2021-06-25: qty 2

## 2021-06-25 MED ORDER — DIPHENHYDRAMINE HCL 25 MG PO CAPS
50.0000 mg | ORAL_CAPSULE | Freq: Once | ORAL | Status: AC
  Administered 2021-06-25: 50 mg via ORAL
  Filled 2021-06-25: qty 2

## 2021-06-25 MED ORDER — SODIUM CHLORIDE 0.9 % IV SOLN: Freq: Once | INTRAVENOUS | Status: AC

## 2021-06-25 MED ORDER — SODIUM CHLORIDE 0.9 % IV SOLN
375.0000 mg/m2 | Freq: Once | INTRAVENOUS | Status: AC
  Administered 2021-06-25: 600 mg via INTRAVENOUS
  Filled 2021-06-25: qty 50

## 2021-06-26 LAB — BETA 2 MICROGLOBULIN, SERUM: Beta-2 Microglobulin: 3.1 mg/L — ABNORMAL HIGH (ref 0.6–2.4)

## 2021-06-26 LAB — IGG, IGA, IGM
IgA: 114 mg/dL (ref 87–352)
IgG (Immunoglobin G), Serum: 750 mg/dL (ref 586–1602)
IgM (Immunoglobulin M), Srm: 22 mg/dL — ABNORMAL LOW (ref 26–217)

## 2021-07-02 ENCOUNTER — Other Ambulatory Visit: Payer: Self-pay | Admitting: Oncology

## 2021-07-03 ENCOUNTER — Encounter: Payer: Self-pay | Admitting: Internal Medicine

## 2021-07-03 ENCOUNTER — Ambulatory Visit (INDEPENDENT_AMBULATORY_CARE_PROVIDER_SITE_OTHER): Payer: Medicare Other | Admitting: Internal Medicine

## 2021-07-03 ENCOUNTER — Other Ambulatory Visit: Payer: Self-pay

## 2021-07-03 DIAGNOSIS — B430 Cutaneous chromomycosis: Secondary | ICD-10-CM

## 2021-07-03 NOTE — Assessment & Plan Note (Signed)
I do not find any evidence of disseminated/persistent chroma blastomycosis.  Given that she is on rituximab her increase her itraconazole to 200 mg twice daily and stay on it for at least 6 more weeks. ?

## 2021-07-03 NOTE — Progress Notes (Signed)
?  ? ? ? ? ?[facility] ? ?Patient Active Problem List  ? Diagnosis Date Noted  ? 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  Priority: High  ? 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  Priority: High  ? Counseling regarding goals of care 04/03/2021  ? Open wound of umbilical region 15/52/0802  ? Osteopenia determined by x-ray 10/04/2014  ? ? ?Patient's Medications  ?New Prescriptions  ? No medications on file  ?Previous Medications  ? ALLOPURINOL (ZYLOPRIM) 300 MG TABLET    Take 1 tablet (300 mg total) by mouth daily. Start April 10, 2021  ? ALPRAZOLAM (XANAX) 0.5 MG TABLET      ? CALCIUM CARBONATE (OS-CAL) 1250 (500 CA) MG CHEWABLE TABLET    Chew by mouth.  ? CHOLECALCIFEROL (VITAMIN D3) 5000 UNITS TABS    Take by mouth.  ? ESTRADIOL (VIVELLE-DOT) 0.05 MG/24HR PATCH    Place 1 patch onto the skin 2 (two) times a week.  ? FERROUS GLUCONATE (FERGON) 324 MG TABLET    1 tablet with water or juice between meals  ? GLUCOSAMINE-CHONDROITIN 500-400 MG TABLET    Take by mouth.  ? IBUPROFEN (ADVIL,MOTRIN) 200 MG TABLET    Take 200 mg by mouth.  ? ITRACONAZOLE (SPORANOX) 100 MG CAPSULE    Take 2 capsules (200 mg total) by mouth 2 (two) times daily.  ? MULTIPLE VITAMINS-MINERALS (MULTIVITAMIN WITH MINERALS) TABLET    Take 1 tablet by mouth daily.  ? OMEGA-3 FATTY ACIDS-VITAMIN E PO    1,000 capsules. Take 2 capsules by mouth daily.  ? RITUXIMAB (RITUXAN) 100 MG/10ML INJECTION    See admin instructions.  ? ZALEPLON (SONATA) 10 MG CAPSULE    Take 10 mg by mouth at bedtime.  ?Modified Medications  ? No medications on file  ?Discontinued Medications  ? No medications on file  ? ? ?Subjective: ?Dana Butler is in for her routine follow-up visit.  She started itraconazole 2 weeks ago for her isolated cutaneous chroma blastomycosis.  She has not had any problems tolerate it.  Her left elbow biopsy site is healing slowly but nicely.  The area on her right knee that she was concerned about has resolved.  She  has not had any new skin lesions.  She has not had any problems tolerating itraconazole.  Her serum itraconazole level last month was subtherapeutic.  I left her 2 messages to double her dose but she did not get them so she is still taking 100 mg twice daily.  She is on her third cycle of rituximab for her lymphoma. ? ?Review of Systems: ?Review of Systems  ?Gastrointestinal:  Negative for abdominal pain, diarrhea, nausea and vomiting.  ? ?Past Medical History:  ?Diagnosis Date  ? Cancer ([place]) 2016  ? marginal zone non-hodgkins lymphoma  ? Medical history non-contributory   ? ? ?Social History  ? ?Tobacco Use  ? Smoking status: Never  ? Smokeless tobacco: Never  ?Substance Use Topics  ? Alcohol use: No  ? Drug use: No  ? ? ?Family History  ?Problem Relation Age of Onset  ? Breast cancer Paternal Aunt   ? ? ?Allergies  ?Allergen Reactions  ? Chloraprep One Step [Chlorhexidine Gluconate] Hives  ? Latex Rash  ? Tape Rash  ? ? ?Objective: ?Vitals:  ? 07/03/21 0918  ?BP: 115/73  ?Pulse: (!) 58  ?Temp: 98 ?F (36.7 ?C)  ?TempSrc: Oral  ?Weight: 122 lb (55.3 kg)  ? ?Body mass index is 22.31 kg/m?[name] ? ?  Physical Exam ?Constitutional:   ?   Comments: She is in good spirits.  ?Skin: ?   Comments: Her left elbow biopsy site is healing slowly.  The small, nonspecific lesions on her right elbow and right knee have resolved.  ? ? ?Lab Results ? ?  ?Problem List Items Addressed This Visit   ? ?  ? High  ? Chromoblastomycosis  ?  I do not find any evidence of disseminated/persistent chroma blastomycosis.  Given that she is on rituximab her increase her itraconazole to 200 mg twice daily and stay on it for at least 6 more weeks. ?  ?  ? ? ?Michel Bickers, MD ?[facility] ?[place] ?[phone] pager   [phone] cell ?07/03/2021, 9:49 AM ?

## 2021-07-07 ENCOUNTER — Other Ambulatory Visit (HOSPITAL_COMMUNITY): Payer: Self-pay

## 2021-07-08 ENCOUNTER — Other Ambulatory Visit (HOSPITAL_COMMUNITY): Payer: Self-pay

## 2021-07-15 ENCOUNTER — Inpatient Hospital Stay (HOSPITAL_BASED_OUTPATIENT_CLINIC_OR_DEPARTMENT_OTHER): Payer: Medicare Other | Admitting: Hematology and Oncology

## 2021-07-15 ENCOUNTER — Inpatient Hospital Stay: Payer: Medicare Other

## 2021-07-15 ENCOUNTER — Other Ambulatory Visit: Payer: Self-pay

## 2021-07-15 VITALS — BP 119/60 | HR 62 | Resp 18

## 2021-07-15 DIAGNOSIS — Z5112 Encounter for antineoplastic immunotherapy: Secondary | ICD-10-CM | POA: Diagnosis not present

## 2021-07-15 DIAGNOSIS — C8580 Other specified types of non-Hodgkin lymphoma, unspecified site: Secondary | ICD-10-CM

## 2021-07-15 LAB — CBC WITH DIFFERENTIAL (CANCER CENTER ONLY)
Abs Immature Granulocytes: 0.01 10*3/uL (ref 0.00–0.07)
Basophils Absolute: 0.1 10*3/uL (ref 0.0–0.1)
Basophils Relative: 1 %
Eosinophils Absolute: 0.3 10*3/uL (ref 0.0–0.5)
Eosinophils Relative: 7 %
HCT: 36.3 % (ref 36.0–46.0)
Hemoglobin: 12.1 g/dL (ref 12.0–15.0)
Immature Granulocytes: 0 %
Lymphocytes Relative: 31 %
Lymphs Abs: 1.3 10*3/uL (ref 0.7–4.0)
MCH: 31.5 pg (ref 26.0–34.0)
MCHC: 33.3 g/dL (ref 30.0–36.0)
MCV: 94.5 fL (ref 80.0–100.0)
Monocytes Absolute: 0.3 10*3/uL (ref 0.1–1.0)
Monocytes Relative: 7 %
Neutro Abs: 2.3 10*3/uL (ref 1.7–7.7)
Neutrophils Relative %: 54 %
Platelet Count: 270 10*3/uL (ref 150–400)
RBC: 3.84 MIL/uL — ABNORMAL LOW (ref 3.87–5.11)
RDW: 15.1 % (ref 11.5–15.5)
WBC Count: 4.3 10*3/uL (ref 4.0–10.5)
nRBC: 0 % (ref 0.0–0.2)

## 2021-07-15 LAB — LACTATE DEHYDROGENASE: LDH: 176 U/L (ref 98–192)

## 2021-07-15 LAB — CMP (CANCER CENTER ONLY)
ALT: 23 U/L (ref 0–44)
AST: 35 U/L (ref 15–41)
Albumin: 4.1 g/dL (ref 3.5–5.0)
Alkaline Phosphatase: 179 U/L — ABNORMAL HIGH (ref 38–126)
Anion gap: 3 — ABNORMAL LOW (ref 5–15)
BUN: 12 mg/dL (ref 8–23)
CO2: 27 mmol/L (ref 22–32)
Calcium: 9.4 mg/dL (ref 8.9–10.3)
Chloride: 109 mmol/L (ref 98–111)
Creatinine: 0.72 mg/dL (ref 0.44–1.00)
GFR, Estimated: >60 mL/min (ref >60)
Glucose, Bld: 96 mg/dL (ref 70–99)
Potassium: 4.2 mmol/L (ref 3.5–5.1)
Sodium: 139 mmol/L (ref 135–145)
Total Bilirubin: 0.5 mg/dL (ref 0.3–1.2)
Total Protein: 6.5 g/dL (ref 6.5–8.1)

## 2021-07-15 LAB — URIC ACID: Uric Acid, Serum: 3.4 mg/dL (ref 2.5–7.1)

## 2021-07-15 MED ORDER — SODIUM CHLORIDE 0.9 % IV SOLN: Freq: Once | INTRAVENOUS | Status: AC

## 2021-07-15 MED ORDER — FERROUS SULFATE 325 (65 FE) MG PO TBEC: 325.0000 mg | DELAYED_RELEASE_TABLET | Freq: Every day | ORAL | 3 refills | Status: AC

## 2021-07-15 MED ORDER — ACETAMINOPHEN 325 MG PO TABS
650.0000 mg | ORAL_TABLET | Freq: Once | ORAL | Status: AC
  Administered 2021-07-15: 650 mg via ORAL
  Filled 2021-07-15: qty 2

## 2021-07-15 MED ORDER — SODIUM CHLORIDE 0.9 % IV SOLN
375.0000 mg/m2 | Freq: Once | INTRAVENOUS | Status: AC
  Administered 2021-07-15: 600 mg via INTRAVENOUS
  Filled 2021-07-15: qty 50

## 2021-07-15 MED ORDER — DIPHENHYDRAMINE HCL 25 MG PO CAPS
50.0000 mg | ORAL_CAPSULE | Freq: Once | ORAL | Status: AC
  Administered 2021-07-15: 50 mg via ORAL
  Filled 2021-07-15: qty 2

## 2021-07-15 NOTE — Patient Instructions (Signed)
[facility]  Discharge Instructions: Thank you for choosing [facility] to provide your oncology and hematology care.   If you have a lab appointment with the Cancer Center, please go directly to the Cancer Center and check in at the registration area.   Wear comfortable clothing and clothing appropriate for easy access to any Portacath or PICC line.   We strive to give you quality time with your provider. You may need to reschedule your appointment if you arrive late (15 or more minutes).  Arriving late affects you and other patients whose appointments are after yours.  Also, if you miss three or more appointments without notifying the office, you may be dismissed from the clinic at the provider's discretion.      For prescription refill requests, have your pharmacy contact our office and allow 72 hours for refills to be completed.    Today you received the following chemotherapy and/or immunotherapy agents rituxan      To help prevent nausea and vomiting after your treatment, we encourage you to take your nausea medication as directed.  BELOW ARE SYMPTOMS THAT SHOULD BE REPORTED IMMEDIATELY: *FEVER GREATER THAN 100.4 F (38 C) OR HIGHER *CHILLS OR SWEATING *NAUSEA AND VOMITING THAT IS NOT CONTROLLED WITH YOUR NAUSEA MEDICATION *UNUSUAL SHORTNESS OF BREATH *UNUSUAL BRUISING OR BLEEDING *URINARY PROBLEMS (pain or burning when urinating, or frequent urination) *BOWEL PROBLEMS (unusual diarrhea, constipation, pain near the anus) TENDERNESS IN MOUTH AND THROAT WITH OR WITHOUT PRESENCE OF ULCERS (sore throat, sores in mouth, or a toothache) UNUSUAL RASH, SWELLING OR PAIN  UNUSUAL VAGINAL DISCHARGE OR ITCHING   Items with * indicate a potential emergency and should be followed up as soon as possible or go to the Emergency Department if any problems should occur.  Please show the CHEMOTHERAPY ALERT CARD or IMMUNOTHERAPY ALERT CARD at check-in to the  Emergency Department and triage nurse.  Should you have questions after your visit or need to cancel or reschedule your appointment, please contact [facility]  Dept: [phone]  and follow the prompts.  Office hours are 8:00 a.m. to 4:30 p.m. Monday - Friday. Please note that voicemails left after 4:00 p.m. may not be returned until the following business day.  We are closed weekends and major holidays. You have access to a nurse at all times for urgent questions. Please call the main number to the clinic Dept: [phone] and follow the prompts.   For any non-urgent questions, you may also contact your provider using MyChart. We now offer e-Visits for anyone 18 and older to request care online for non-urgent symptoms. For details visit [email or web address].   Also download the MyChart app! Go to the app store, search "MyChart", open the app, select [facility], and log in with your MyChart username and password.  Due to Covid, a mask is required upon entering the hospital/clinic. If you do not have a mask, one will be given to you upon arrival. For doctor visits, patients may have 1 support person aged 18 or older with them. For treatment visits, patients cannot have anyone with them due to current Covid guidelines and our immunocompromised population.   

## 2021-07-15 NOTE — Assessment & Plan Note (Signed)
2016:?left axillary lymph node biopsy 06/29/2014 for a B-cell non-Hodgkin's lymphoma, consistent with marginal zone nodal lymphoma, advanced stage ?2016:?PET scanning which showed bilateral cervical adenopathy, bilateral axillary adenopathy, some small left common iliac lymph nodes and a normal spleen ?2019:?biopsy of a right breast mass 04/12/2018 showed 89% of the cells to be lambda restricted, with immunophenotype consistent with marginal zone non-Hodgkin's lymphoma (CD19, 20 and 23+) ?----------------------------------------------------------------------------------------------------------------------------? ?Current Treatment:?Rituxan every 3 weeks x4 cycles starting 05/13/2021 (we will may consider maintenance Rituxan every 3 months for 2 years), today cycle 4 ?Rituxan toxicities: ?Did not have any infusion reactions. ?  ?We will add?500?mL of saline to each of her infusions. ?? ?Lab review: WBC count improved to 3.7 from 89. ?Improvement in the hemoglobin to 11.6. ?? ?Return to clinic for Rituxan maintenance every 3 months x 2 years ?

## 2021-07-15 NOTE — Progress Notes (Signed)
? ? ? ?Patient Care Team: ?Sueanne Margarita, DO as PCP - General (Internal Medicine) ?Jodi Marble, MD as Consulting Physician (Otolaryngology) ?Armandina Gemma, MD as Consulting Physician (General Surgery) ?Kennith Center, RD as Dietitian (Family Medicine) ? ?DIAGNOSIS:  ?Encounter Diagnosis  ?Name Primary?  ? 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 ? 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 left axillary lymph node biopsy 06/29/2014 for a B-cell non-Hodgkin's lymphoma, consistent with marginal zone nodal lymphoma, advanced stage ?  ?07/19/2014 Miscellaneous  ? PET scanning which showed bilateral cervical adenopathy, bilateral axillary adenopathy, some small left common iliac lymph nodes and a normal spleen ?  ?04/12/2018 Pathology Results  ? biopsy of a right breast mass 04/12/2018 showed 89% of the cells to be lambda restricted, with immunophenotype consistent with marginal zone non-Hodgkin's lymphoma (CD19, 20 and 23+) ?  ?05/13/2021 -  Chemotherapy  ? Patient is on Treatment Plan : NON-HODGKINS LYMPHOMA Rituximab q21d  ?   ? ? ?CHIEF COMPLIANT: Cycle 3 Rituximab  ? ?INTERVAL HISTORY: Dana Butler is a 66 y.o. with above-mentioned history of nodal marginal zone non-Hodgkin's lymphoma, currently on treatment with Rituximab. She presents to the clinic today for treatment. She states she didn't have any trouble with the last treatment.  ? ?ALLERGIES:  is allergic to chloraprep one step [chlorhexidine gluconate], latex, and tape. ? ?MEDICATIONS:  ?Current Outpatient Medications  ?Medication Sig Dispense Refill  ? ferrous sulfate 325 (65 FE) MG EC tablet Take 1 tablet (325 mg total) by mouth daily with breakfast.  3  ? ALPRAZolam (XANAX) 0.5 MG tablet  (Patient not taking: Reported on 05/20/2021)    ? calcium carbonate (OS-CAL) 1250 (500 Ca) MG chewable tablet Chew by mouth.    ? Cholecalciferol (VITAMIN D3) 5000 UNITS TABS Take by mouth.    ?  estradiol (VIVELLE-DOT) 0.05 MG/24HR patch Place 1 patch onto the skin 2 (two) times a week.    ? glucosamine-chondroitin 500-400 MG tablet Take by mouth.    ? ibuprofen (ADVIL,MOTRIN) 200 MG tablet Take 200 mg by mouth.    ? itraconazole (SPORANOX) 100 MG capsule Take 2 capsules (200 mg total) by mouth 2 (two) times daily. 120 capsule 2  ? Multiple Vitamins-Minerals (MULTIVITAMIN WITH MINERALS) tablet Take 1 tablet by mouth daily.    ? OMEGA-3 FATTY ACIDS-VITAMIN E PO 1,000 capsules. Take 2 capsules by mouth daily.    ? riTUXimab (RITUXAN) 100 MG/10ML injection See admin instructions.    ? zaleplon (SONATA) 10 MG capsule Take 10 mg by mouth at bedtime.    ? ?No current facility-administered medications for this visit.  ? ? ?PHYSICAL EXAMINATION: ?ECOG PERFORMANCE STATUS: 0 - Asymptomatic ? ?Vitals:  ? 07/15/21 0906  ?BP: 132/63  ?Pulse: 78  ?Resp: 18  ?Temp: (!) 97.2 ?F (36.2 ?C)  ?SpO2: 98%  ? ?Filed Weights  ? 07/15/21 0906  ?Weight: 128 lb 1.6 oz (58.1 kg)  ?  ? ?LABORATORY DATA:  ?I have reviewed the data as listed ?CMP Latest Ref Rng & Units 07/15/2021 06/25/2021 06/03/2021  ?Glucose 70 - 99 mg/dL 96 99 105(H)  ?BUN 8 - 23 mg/dL '12 10 18  '$ ?Creatinine 0.44 - 1.00 mg/dL 0.72 0.66 0.71  ?Sodium 135 - 145 mmol/L 139 138 139  ?Potassium 3.5 - 5.1 mmol/L 4.2 4.1 4.1  ?Chloride 98 - 111 mmol/L 109 106 109  ?CO2 22 - 32 mmol/L '27 26 24  '$ ?  Calcium 8.9 - 10.3 mg/dL 9.4 9.2 8.6(L)  ?Total Protein 6.5 - 8.1 g/dL 6.5 6.1(L) 6.1(L)  ?Total Bilirubin 0.3 - 1.2 mg/dL 0.5 0.8 0.5  ?Alkaline Phos 38 - 126 U/L 179(H) 169(H) 229(H)  ?AST 15 - 41 U/L 35 27 28  ?ALT 0 - 44 U/L '23 16 19  '$ ? ? ?Lab Results  ?Component Value Date  ? WBC 4.3 07/15/2021  ? HGB 12.1 07/15/2021  ? HCT 36.3 07/15/2021  ? MCV 94.5 07/15/2021  ? PLT 270 07/15/2021  ? NEUTROABS 2.3 07/15/2021  ? ? 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[place]) ?2016: left axillary lymph node biopsy 06/29/2014 for a B-cell non-Hodgkin's lymphoma, consistent with marginal  zone nodal lymphoma, advanced stage ?2016: PET scanning which showed bilateral cervical adenopathy, bilateral axillary adenopathy, some small left common iliac lymph nodes and a normal spleen ?2019: biopsy of a right breast mass 04/12/2018 showed 89% of the cells to be lambda restricted, with immunophenotype consistent with marginal zone non-Hodgkin's lymphoma (CD19, 20 and 23+) ?----------------------------------------------------------------------------------------------------------------------------  ?Current Treatment: Rituxan every 3 weeks x4 cycles starting 05/13/2021 (we will may consider maintenance Rituxan every 3 months for 2 years), today cycle 4 ?Rituxan toxicities: ?Did not have any infusion reactions. ?  ?We will add 500 mL of saline to each of her infusions. ?  ?Lab review: WBC count improved to 4.3 from 89. ?Improvement in the hemoglobin to 12.1 ?  ?Return to clinic for Rituxan maintenance every 3 months x 2 years ? ? ? ?No orders of the defined types were placed in this encounter. ? ?The patient has a good understanding of the overall plan. she agrees with it. she will call with any problems that may develop before the next visit here. ?Total time spent: 30 mins including face to face time and time spent for planning, charting and co-ordination of care ? ? Harriette Ohara, MD ?07/15/21 ? ?I, Gardiner Coins, am acting as a scribe for Dr. Lindi Adie  ? ? I have reviewed the above documentation for accuracy and completeness, and I agree with the above. ? ?

## 2021-07-16 LAB — IGG, IGA, IGM
IgA: 109 mg/dL (ref 87–352)
IgG (Immunoglobin G), Serum: 764 mg/dL (ref 586–1602)
IgM (Immunoglobulin M), Srm: 27 mg/dL (ref 26–217)

## 2021-07-16 LAB — BETA 2 MICROGLOBULIN, SERUM: Beta-2 Microglobulin: 2.8 mg/L — ABNORMAL HIGH (ref 0.6–2.4)

## 2021-08-07 ENCOUNTER — Telehealth: Payer: Self-pay

## 2021-08-07 ENCOUNTER — Other Ambulatory Visit: Payer: Self-pay | Admitting: Internal Medicine

## 2021-08-07 ENCOUNTER — Other Ambulatory Visit (HOSPITAL_COMMUNITY): Payer: Self-pay

## 2021-08-07 DIAGNOSIS — B430 Cutaneous chromomycosis: Secondary | ICD-10-CM

## 2021-08-07 MED ORDER — ITRACONAZOLE 100 MG PO CAPS
200.0000 mg | ORAL_CAPSULE | Freq: Two times a day (BID) | ORAL | 2 refills | Status: DC
  Filled 2021-08-07: qty 120, 30d supply, fill #0

## 2021-08-07 NOTE — Telephone Encounter (Signed)
Notified patient that refills were sent.  ? ?Beryle Flock, RN ? ?

## 2021-08-07 NOTE — Telephone Encounter (Signed)
Patient called, says she took her last dose of itraconazole this morning. She would like to know if she needs to continue with this. Will route to provider.  ? ?Beryle Flock, RN ? ?

## 2021-08-08 ENCOUNTER — Other Ambulatory Visit (HOSPITAL_COMMUNITY): Payer: Self-pay

## 2021-08-20 ENCOUNTER — Ambulatory Visit (INDEPENDENT_AMBULATORY_CARE_PROVIDER_SITE_OTHER): Payer: Medicare Other | Admitting: Internal Medicine

## 2021-08-20 ENCOUNTER — Encounter: Payer: Self-pay | Admitting: Internal Medicine

## 2021-08-20 ENCOUNTER — Other Ambulatory Visit: Payer: Self-pay

## 2021-08-20 DIAGNOSIS — B430 Cutaneous chromomycosis: Secondary | ICD-10-CM

## 2021-08-20 MED ORDER — ITRACONAZOLE 100 MG PO CAPS: 200.0000 mg | ORAL_CAPSULE | Freq: Two times a day (BID) | ORAL | 0 refills | Status: AC

## 2021-08-20 NOTE — Progress Notes (Signed)
?  ? ? ? ? ?[facility] for [facility] ? ?Patient Active Problem List  ? Diagnosis Date Noted  ? 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  Priority: High  ? 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  Priority: High  ? Counseling regarding goals of care 04/03/2021  ? Open wound of umbilical region 41/28/7867  ? Osteopenia determined by x-ray 10/04/2014  ? ? ?Patient's Medications  ?New Prescriptions  ? No medications on file  ?Previous Medications  ? ALPRAZOLAM (XANAX) 0.5 MG TABLET      ? CALCIUM CARBONATE (OS-CAL) 1250 (500 CA) MG CHEWABLE TABLET    Chew by mouth.  ? CHOLECALCIFEROL (VITAMIN D3) 5000 UNITS TABS    Take by mouth.  ? ESTRADIOL (VIVELLE-DOT) 0.05 MG/24HR PATCH    Place 1 patch onto the skin 2 (two) times a week.  ? FERROUS SULFATE 325 (65 FE) MG EC TABLET    Take 1 tablet (325 mg total) by mouth daily with breakfast.  ? GLUCOSAMINE-CHONDROITIN 500-400 MG TABLET    Take by mouth.  ? IBUPROFEN (ADVIL,MOTRIN) 200 MG TABLET    Take 200 mg by mouth.  ? MULTIPLE VITAMINS-MINERALS (MULTIVITAMIN WITH MINERALS) TABLET    Take 1 tablet by mouth daily.  ? OMEGA-3 FATTY ACIDS-VITAMIN E PO    1,000 capsules. Take 2 capsules by mouth daily.  ? RITUXIMAB (RITUXAN) 100 MG/10ML INJECTION    See admin instructions.  ? ZALEPLON (SONATA) 10 MG CAPSULE    Take 10 mg by mouth at bedtime.  ?Modified Medications  ? Modified Medication Previous Medication  ? ITRACONAZOLE (SPORANOX) 100 MG CAPSULE itraconazole (SPORANOX) 100 MG capsule  ?    Take 2 capsules (200 mg total) by mouth 2 (two) times daily for 14 days.    Take 2 capsules (200 mg total) by mouth 2 (two) times daily.  ?Discontinued Medications  ? No medications on file  ? ? ?Subjective: ?Dana Butler is in for her routine follow-up visit.  She started itraconazole on 05/20/2021 for her isolated cutaneous chrom0blastomycosis.  She has not had any problems tolerate it.  Her left elbow biopsy site is healing slowly but nicely.  The area on her right knee that she  was concerned about has resolved.  She has not had any new skin lesions.  She has not had any problems tolerating itraconazole.  Her serum itraconazole level last month was subtherapeutic.  I increased her itraconazole dose at the time of her last visit.  She has not had any problems tolerating it.  She has not noted any new skin lesions and feels like her left elbow biopsy site is healing nicely.  She is on her fourth cycle of rituximab for her lymphoma. ? ?Review of Systems: ?Review of Systems  ?Gastrointestinal:  Negative for abdominal pain, diarrhea, nausea and vomiting.  ? ?Past Medical History:  ?Diagnosis Date  ? Cancer ([place]) 2016  ? marginal zone non-hodgkins lymphoma  ? Medical history non-contributory   ? ? ?Social History  ? ?Tobacco Use  ? Smoking status: Never  ? Smokeless tobacco: Never  ?Substance Use Topics  ? Alcohol use: No  ? Drug use: No  ? ? ?Family History  ?Problem Relation Age of Onset  ? Breast cancer Paternal Aunt   ? ? ?Allergies  ?Allergen Reactions  ? Chloraprep One Step [Chlorhexidine Gluconate] Hives  ? Latex Rash  ? Tape Rash  ? ? ?Objective: ?Vitals:  ? 08/20/21 0938  ?BP: 135/75  ?Pulse: (!) 55  ?  Temp: (!) 97.5 ?F (36.4 ?C)  ?TempSrc: Oral  ?Weight: 124 lb (56.2 kg)  ? ?Body mass index is 22.68 kg/m?. ? ?Physical Exam ?Constitutional:   ?   Comments: She is in good spirits.  ?Skin: ?   Comments: Her left elbow biopsy site continues slow healing.  She has no new skin lesions.  ? ? ? ? ?  ?Problem List Items Addressed This Visit   ? ?  ? High  ? Chromoblastomycosis  ?  She has about 2 more weeks of itraconazole left in her current prescription.  She will complete that then stop and continue surveillance for any new skin lesions.  She will follow-up with me in 2 months. ? ?  ?  ? Relevant Medications  ? itraconazole (SPORANOX) 100 MG capsule  ? ?Michel Bickers, MD ?[facility] for Infectious Disease ?[place] ?[phone] pager   [phone] cell ?08/20/2021, 9:51 AM ?

## 2021-08-20 NOTE — Assessment & Plan Note (Signed)
She has about 2 more weeks of itraconazole left in her current prescription.  She will complete that then stop and continue surveillance for any new skin lesions.  She will follow-up with me in 2 months. ?

## 2021-08-22 ENCOUNTER — Other Ambulatory Visit: Payer: Self-pay | Admitting: *Deleted

## 2021-08-22 DIAGNOSIS — C8580 Other specified types of non-Hodgkin lymphoma, unspecified site: Secondary | ICD-10-CM

## 2021-08-22 NOTE — Progress Notes (Signed)
Received call from pt with complaint of abdominal cramping and bloating x1 week.  Pt states symptoms are now resolved and is not experiencing any fevers at this time. Pt states pts spouse is a MD and preformed abdominal assessment on pt and found pt to experience left sided rebound tenderness and felt a right lower quadrant immobile mass.  Per MD pt needing to be seen in ED for further evaluation.  Pt states she does not want to be seen in ED at this time and is wanting to know if out pt workup can be completed considering pt is asymptomatic at this time.  Verbal orders received from MD to obtain CT CAP. Orders placed, appt scheduled and pt verbalized understanding of appt date and time.  RN educated pt to seek care in ED if pt develops symptoms of abdominal pain, vomiting, diarrhea, or fever.  Pt verbalized understanding.  ?

## 2021-08-26 ENCOUNTER — Ambulatory Visit (HOSPITAL_COMMUNITY)
Admission: RE | Admit: 2021-08-26 | Discharge: 2021-08-26 | Disposition: A | Discharge status: Home / Self Care | Payer: Medicare Other | Source: Ambulatory Visit | Attending: Hematology and Oncology | Admitting: Hematology and Oncology

## 2021-08-26 DIAGNOSIS — C8580 Other specified types of non-Hodgkin lymphoma, unspecified site: Secondary | ICD-10-CM | POA: Diagnosis not present

## 2021-08-26 MED ORDER — IOHEXOL 300 MG/ML  SOLN
100.0000 mL | Freq: Once | INTRAMUSCULAR | Status: AC | PRN
  Administered 2021-08-26: 100 mL via INTRAVENOUS

## 2021-08-26 MED ORDER — SODIUM CHLORIDE (PF) 0.9 % IJ SOLN
INTRAMUSCULAR | Status: AC
  Filled 2021-08-26: qty 50

## 2021-08-28 ENCOUNTER — Ambulatory Visit: Payer: 59

## 2021-08-28 ENCOUNTER — Other Ambulatory Visit: Payer: Self-pay

## 2021-08-28 ENCOUNTER — Inpatient Hospital Stay: Payer: Medicare Other | Attending: Oncology | Admitting: Hematology and Oncology

## 2021-08-28 DIAGNOSIS — C8580 Other specified types of non-Hodgkin lymphoma, unspecified site: Secondary | ICD-10-CM | POA: Insufficient documentation

## 2021-08-28 NOTE — Assessment & Plan Note (Signed)
2016:?left axillary lymph node biopsy 06/29/2014 for a B-cell non-Hodgkin's lymphoma, consistent with marginal zone nodal lymphoma, advanced stage ?2016:?PET scanning which showed bilateral cervical adenopathy, bilateral axillary adenopathy, some small left common iliac lymph nodes and a normal spleen ?2019:?biopsy of a right breast mass 04/12/2018 showed 89% of the cells to be lambda restricted, with immunophenotype consistent with marginal zone non-Hodgkin's lymphoma (CD19, 20 and 23+) ?----------------------------------------------------------------------------------------------------------------------------? ?Current Treatment:?Rituxan every 3 weeks x4 cycles starting 05/13/2021-07/15/2021 ?Today is cycle 1 maintenance ? ?Rituxan toxicities: ?Did not have any infusion reactions. ?CT CAP 08/26/2021: Pathologically enlarged lymph nodes above and below the diaphragm consistent with diagnosis of lymphoma.  Prominent lucency in bilateral proximal humeri and clavicles and multifocal lucencies throughout the pelvis and femurs.  No splenomegaly ? ?Lab review: WBC count improved to?4.3?from 35. ?Improvement in the hemoglobin to 12.1 ?? ?Return to clinic for Rituxan maintenance every 3 months x 2 years ?I would like to refer the patient to see Dr.Kale for lymphoma follow-up ?

## 2021-08-28 NOTE — Progress Notes (Signed)
? ?Patient Care Team: ?Sueanne Margarita, DO as PCP - General (Internal Medicine) ?Jodi Marble, MD as Consulting Physician (Otolaryngology) ?Armandina Gemma, MD as Consulting Physician (General Surgery) ?Kennith Center, RD as Dietitian (Family Medicine) ? ?DIAGNOSIS:  ?Encounter Diagnosis  ?Name Primary?  ? 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 ? 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 left axillary lymph node biopsy 06/29/2014 for a B-cell non-Hodgkin's lymphoma, consistent with marginal zone nodal lymphoma, advanced stage ?  ?07/19/2014 Miscellaneous  ? PET scanning which showed bilateral cervical adenopathy, bilateral axillary adenopathy, some small left common iliac lymph nodes and a normal spleen ?  ?04/12/2018 Pathology Results  ? biopsy of a right breast mass 04/12/2018 showed 89% of the cells to be lambda restricted, with immunophenotype consistent with marginal zone non-Hodgkin's lymphoma (CD19, 20 and 23+) ?  ?05/13/2021 -  Chemotherapy  ? Patient is on Treatment Plan : NON-HODGKINS LYMPHOMA Rituximab q21d  ? ?  ?  ?08/05/2021 -  Chemotherapy  ? Patient is on Treatment Plan : NON-HODGKINS LYMPHOMA Rituximab q60d Maintenance  ? ?  ?  ? ? ?CHIEF COMPLIANT: Follow-up to discuss results of recent CT scan ? ?INTERVAL HISTORY: Dana Butler is a 66 year old with above-mentioned history of marginal zone lymphoma who received Rituxan every 3 weeks x 4 with excellent improvement in the white blood cell count.  She noted bulky abdominal lymph nodes and therefore we obtained a CT of her chest abdomen pelvis and she is here today to discuss results.  She reports no symptoms related to these lymph nodes.  She denies any abdominal pain fevers chills night sweats or weight loss. ? ? ?ALLERGIES:  is allergic to chloraprep one step [chlorhexidine gluconate], latex, and tape. ? ?MEDICATIONS:  ?Current Outpatient Medications  ?Medication Sig  Dispense Refill  ? ALPRAZolam (XANAX) 0.5 MG tablet  (Patient not taking: Reported on 05/20/2021)    ? calcium carbonate (OS-CAL) 1250 (500 Ca) MG chewable tablet Chew by mouth.    ? Cholecalciferol (VITAMIN D3) 5000 UNITS TABS Take by mouth.    ? estradiol (VIVELLE-DOT) 0.05 MG/24HR patch Place 1 patch onto the skin 2 (two) times a week.    ? ferrous sulfate 325 (65 FE) MG EC tablet Take 1 tablet (325 mg total) by mouth daily with breakfast.  3  ? glucosamine-chondroitin 500-400 MG tablet Take by mouth.    ? ibuprofen (ADVIL,MOTRIN) 200 MG tablet Take 200 mg by mouth.    ? itraconazole (SPORANOX) 100 MG capsule Take 2 capsules (200 mg total) by mouth 2 (two) times daily for 14 days. 56 capsule 0  ? Multiple Vitamins-Minerals (MULTIVITAMIN WITH MINERALS) tablet Take 1 tablet by mouth daily.    ? OMEGA-3 FATTY ACIDS-VITAMIN E PO 1,000 capsules. Take 2 capsules by mouth daily.    ? riTUXimab (RITUXAN) 100 MG/10ML injection See admin instructions.    ? zaleplon (SONATA) 10 MG capsule Take 10 mg by mouth at bedtime.    ? ?No current facility-administered medications for this visit.  ? ? ?PHYSICAL EXAMINATION: ?ECOG PERFORMANCE STATUS: 1 - Symptomatic but completely ambulatory ? ?Vitals:  ? 08/28/21 1206  ?BP: 139/63  ?Pulse: 81  ?Resp: 18  ?Temp: (!) 97.5 ?F (36.4 ?C)  ?SpO2: 98%  ? ?Filed Weights  ? 08/28/21 1206  ?Weight: 120 lb (54.4 kg)  ? ? ?BREAST: Clearly palpable abdominal cervical axillary lymph nodes ? ?LABORATORY DATA:  ?I have  reviewed the data as listed ? ?  Latest Ref Rng & Units 07/15/2021  ?  8:49 AM 06/25/2021  ?  8:10 AM 06/03/2021  ?  8:09 AM  ?CMP  ?Glucose 70 - 99 mg/dL 96   99   105    ?BUN 8 - 23 mg/dL '12   10   18    '$ ?Creatinine 0.44 - 1.00 mg/dL 0.72   0.66   0.71    ?Sodium 135 - 145 mmol/L 139   138   139    ?Potassium 3.5 - 5.1 mmol/L 4.2   4.1   4.1    ?Chloride 98 - 111 mmol/L 109   106   109    ?CO2 22 - 32 mmol/L '27   26   24    '$ ?Calcium 8.9 - 10.3 mg/dL 9.4   9.2   8.6    ?Total Protein 6.5 -  8.1 g/dL 6.5   6.1   6.1    ?Total Bilirubin 0.3 - 1.2 mg/dL 0.5   0.8   0.5    ?Alkaline Phos 38 - 126 U/L 179   169   229    ?AST 15 - 41 U/L 35   27   28    ?ALT 0 - 44 U/L '23   16   19    '$ ? ? ?Lab Results  ?Component Value Date  ? WBC 4.3 07/15/2021  ? HGB 12.1 07/15/2021  ? HCT 36.3 07/15/2021  ? MCV 94.5 07/15/2021  ? PLT 270 07/15/2021  ? NEUTROABS 2.3 07/15/2021  ? ? 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[place]) ?2016: left axillary lymph node biopsy 06/29/2014 for a B-cell non-Hodgkin's lymphoma, consistent with marginal zone nodal lymphoma, advanced stage ?2016: PET scanning which showed bilateral cervical adenopathy, bilateral axillary adenopathy, some small left common iliac lymph nodes and a normal spleen ?2019: biopsy of a right breast mass 04/12/2018 showed 89% of the cells to be lambda restricted, with immunophenotype consistent with marginal zone non-Hodgkin's lymphoma (CD19, 20 and 23+) ?----------------------------------------------------------------------------------------------------------------------------  ?Current Treatment: Rituxan every 3 weeks x4 cycles starting 05/13/2021-07/15/2021 ?Today is cycle 1 maintenance ? ?Rituxan toxicities: ?Did not have any infusion reactions. ?CT CAP 08/26/2021: Pathologically enlarged lymph nodes above and below the diaphragm consistent with diagnosis of lymphoma.  Prominent lucency in bilateral proximal humeri and clavicles and multifocal lucencies throughout the pelvis and femurs.  No splenomegaly ? ?Lab review: WBC count improved to 4.3 from 89. ?Improvement in the hemoglobin to 12.1 ?  ?Return to clinic for Rituxan maintenance every 3 months x 2 years ?I would like to refer the patient to see Dr.Kale for lymphoma follow-up ? ? ?No orders of the defined types were placed in this encounter. ? ?The patient has a good understanding of the overall plan. she agrees with it. she will call with any problems that may develop before the next visit  here. ?Total time spent: 30 mins including face to face time and time spent for planning, charting and co-ordination of care ? ? Harriette Ohara, MD ?08/28/21 ? ? ? ?

## 2021-08-29 ENCOUNTER — Inpatient Hospital Stay: Payer: Medicare Other

## 2021-08-29 DIAGNOSIS — C8580 Other specified types of non-Hodgkin lymphoma, unspecified site: Secondary | ICD-10-CM | POA: Diagnosis not present

## 2021-08-29 LAB — CMP (CANCER CENTER ONLY)
ALT: 19 U/L (ref 0–44)
AST: 25 U/L (ref 15–41)
Albumin: 4.1 g/dL (ref 3.5–5.0)
Alkaline Phosphatase: 142 U/L — ABNORMAL HIGH (ref 38–126)
Anion gap: 5 (ref 5–15)
BUN: 12 mg/dL (ref 8–23)
CO2: 26 mmol/L (ref 22–32)
Calcium: 9.4 mg/dL (ref 8.9–10.3)
Chloride: 106 mmol/L (ref 98–111)
Creatinine: 0.82 mg/dL (ref 0.44–1.00)
GFR, Estimated: >60 mL/min (ref >60)
Glucose, Bld: 95 mg/dL (ref 70–99)
Potassium: 4 mmol/L (ref 3.5–5.1)
Sodium: 137 mmol/L (ref 135–145)
Total Bilirubin: 0.8 mg/dL (ref 0.3–1.2)
Total Protein: 6.5 g/dL (ref 6.5–8.1)

## 2021-08-29 LAB — CBC WITH DIFFERENTIAL (CANCER CENTER ONLY)
Abs Immature Granulocytes: 0.01 10*3/uL (ref 0.00–0.07)
Basophils Absolute: 0 10*3/uL (ref 0.0–0.1)
Basophils Relative: 1 %
Eosinophils Absolute: 0.1 10*3/uL (ref 0.0–0.5)
Eosinophils Relative: 3 %
HCT: 38.1 % (ref 36.0–46.0)
Hemoglobin: 12.6 g/dL (ref 12.0–15.0)
Immature Granulocytes: 0 %
Lymphocytes Relative: 60 %
Lymphs Abs: 2.1 10*3/uL (ref 0.7–4.0)
MCH: 30.6 pg (ref 26.0–34.0)
MCHC: 33.1 g/dL (ref 30.0–36.0)
MCV: 92.5 fL (ref 80.0–100.0)
Monocytes Absolute: 0.3 10*3/uL (ref 0.1–1.0)
Monocytes Relative: 10 %
Neutro Abs: 0.9 10*3/uL — ABNORMAL LOW (ref 1.7–7.7)
Neutrophils Relative %: 26 %
Platelet Count: 233 10*3/uL (ref 150–400)
RBC: 4.12 MIL/uL (ref 3.87–5.11)
RDW: 13.9 % (ref 11.5–15.5)
WBC Count: 3.5 10*3/uL — ABNORMAL LOW (ref 4.0–10.5)
nRBC: 0 % (ref 0.0–0.2)

## 2021-08-29 LAB — LACTATE DEHYDROGENASE: LDH: 161 U/L (ref 98–192)

## 2021-08-29 LAB — URIC ACID: Uric Acid, Serum: 4.5 mg/dL (ref 2.5–7.1)

## 2021-09-01 ENCOUNTER — Other Ambulatory Visit: Payer: Self-pay | Admitting: *Deleted

## 2021-09-01 DIAGNOSIS — C8580 Other specified types of non-Hodgkin lymphoma, unspecified site: Secondary | ICD-10-CM

## 2021-09-01 LAB — PROTEIN ELECTROPHORESIS, SERUM, WITH REFLEX
A/G Ratio: 1.8 — ABNORMAL HIGH (ref 0.7–1.7)
Albumin ELP: 3.9 g/dL (ref 2.9–4.4)
Alpha-1-Globulin: 0.2 g/dL (ref 0.0–0.4)
Alpha-2-Globulin: 0.6 g/dL (ref 0.4–1.0)
Beta Globulin: 0.7 g/dL (ref 0.7–1.3)
Gamma Globulin: 0.7 g/dL (ref 0.4–1.8)
Globulin, Total: 2.2 g/dL (ref 2.2–3.9)
Total Protein ELP: 6.1 g/dL (ref 6.0–8.5)

## 2021-09-01 NOTE — Progress Notes (Signed)
Received call from pt with complaint of skull bone pain x1 week.  Verbal orders received from MD to order whole body PET. Orders placed, pt notified and verbalized understanding.  ?

## 2021-09-03 ENCOUNTER — Inpatient Hospital Stay (HOSPITAL_BASED_OUTPATIENT_CLINIC_OR_DEPARTMENT_OTHER): Payer: Medicare Other | Admitting: Hematology

## 2021-09-03 ENCOUNTER — Other Ambulatory Visit: Payer: Self-pay

## 2021-09-03 DIAGNOSIS — C8580 Other specified types of non-Hodgkin lymphoma, unspecified site: Secondary | ICD-10-CM | POA: Diagnosis not present

## 2021-09-03 NOTE — Progress Notes (Signed)
. ? ? ?HEMATOLOGY/ONCOLOGY CONSULTATION NOTE ? ?Date of Service: 09/03/2021 ? ?Patient Care Team: ?Sueanne Margarita, DO as PCP - General (Internal Medicine) ?Jodi Marble, MD as Consulting Physician (Otolaryngology) ?Armandina Gemma, MD as Consulting Physician (General Surgery) ?Kennith Center, RD as Dietitian (Family Medicine) ? ?CHIEF COMPLAINTS/PURPOSE OF CONSULTATION:  ?Transition of care for continued evaluation and management of marginal zone lymphoma ? ?HISTORY OF PRESENTING ILLNESS:  ? ?Dana Butler is a wonderful 66 y.o. female who has been referred to [place] by Dr. Nicholas Lose, MD for continued evaluation and management of notable marginal zone lymphoma. ?Prior to seeing Dr. Lindi Adie the patient was followed by Dr. Jana Hakim. ? ?. 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 left axillary lymph node biopsy 06/29/2014 for a B-cell non-Hodgkin's lymphoma, consistent with marginal zone nodal lymphoma, advanced stage ?  ?07/19/2014 Miscellaneous  ? PET scanning which showed bilateral cervical adenopathy, bilateral axillary adenopathy, some small left common iliac lymph nodes and a normal spleen ?  ?04/12/2018 Pathology Results  ? biopsy of a right breast mass 04/12/2018 showed 89% of the cells to be lambda restricted, with immunophenotype consistent with marginal zone non-Hodgkin's lymphoma (CD19, 20 and 23+) ?  ?05/13/2021 -  Chemotherapy  ? Patient is on Treatment Plan : NON-HODGKINS LYMPHOMA Rituximab q21d  ? ?   ?08/05/2021 -  Chemotherapy  ? Patient is on Treatment Plan : NON-HODGKINS LYMPHOMA Rituximab q60d Maintenance  ? ?   ? ?Patient was diagnosed with nodal marginal zone lymphoma based on lymph node biopsy of her left axillary lymph node on 06/29/2014 and has been following with Dr. Jana Hakim for active surveillance and until recently did not meet criteria for consideration of treatment. ?In December 2019 on 04/12/2018 the patient had a biopsy of a right breast mass that  showed pathology consistent with marginal zone lymphoma. ? ?Patient over the last year or so has had increasing WBC counts which were as high as 91.2k in December 2022.  Patient also noted to have some anemia with hemoglobin of 11 and normal platelet counts of 234k in January 2023.  She also started developing some lymphadenopathy in the neck. ?Patient was started on Rituxan every 3 weeks for 4 doses by Dr. Lindi Adie with resultant normalization of her WBC counts and normalization of her hemoglobin levels up to 12.6 today with a WBC count of 3.5k and platelets of 223k ? ?Patient had a CT chest abdomen pelvis on 08/26/2021 prior to starting maintenance Rituxan.  This imaging study showed pathologically enlarged lymph nodes above and below the diaphragm consistent with patient's history of marginal zone lymphoma.  She was also noted to have prominent lucency involving the marrow and bilateral proximal humeri and clavicles with multifocal lucencies throughout the pelvis and proximal femurs with at least one extending to the outer cortex in the left iliac bone.  No splenomegaly or solid organ lymphomatous involvement noted. ?Patient had an SPEP on account of her bone lesions and this showed no monoclonal paraproteinemia. ? ?Patient has been scheduled for a PET CT scan on 09/08/2021 for further evaluation of her bone lesions as well as her adenopathy. ? ?We discussed that there would be different approaches to treatment depending on how bulky her disease is and how much response is necessary to address any considerations for possible complications from the underlying lymphoma. ? ?We discussed options of continued maintenance Rituxan versus more definitive treatment with Bendamustine Rituxan. ?Patient and her husband would like  to think about this in more detail which would be reasonable.  We also discussed that we could look at her PET scan prior to making this decision and discussed this on the phone. ? ? ?MEDICAL HISTORY:   ?Past Medical History:  ?Diagnosis Date  ? Cancer ([place]) 2016  ? marginal zone non-hodgkins lymphoma  ? Medical history non-contributory   ? ? ?SURGICAL HISTORY: ?Past Surgical History:  ?Procedure Laterality Date  ? ABDOMINAL HYSTERECTOMY    ? AXILLARY LYMPH NODE BIOPSY Left 06/29/2014  ? Procedure: AXILLARY LYMPH NODE BIOPSY;  Surgeon: Armandina Gemma, MD;  Location: [facility];  Service: General;  Laterality: Left;  ? COLONOSCOPY    ? DIAGNOSTIC LAPAROSCOPY  2005  ? BSO  ? MASS EXCISION N/A 02/23/2019  ? Procedure: Excision of umbilicus skin lesion;  Surgeon: Wallace Going, DO;  Location: [facility];  Service: Plastics;  Laterality: N/A;  ? ? ?SOCIAL HISTORY: ?Social History  ? ?Socioeconomic History  ? Marital status: Married  ?  Spouse name: Not on file  ? Number of children: Not on file  ? Years of education: Not on file  ? Highest education level: Not on file  ?Occupational History  ? Not on file  ?Tobacco Use  ? Smoking status: Never  ? Smokeless tobacco: Never  ?Substance and Sexual Activity  ? Alcohol use: No  ? Drug use: No  ? Sexual activity: Not on file  ?Other Topics Concern  ? Not on file  ?Social History Narrative  ? Not on file  ? ?Social Determinants of Health  ? ?Financial Resource Strain: Not on file  ?Food Insecurity: Not on file  ?Transportation Needs: Not on file  ?Physical Activity: Not on file  ?Stress: Not on file  ?Social Connections: Not on file  ?Intimate Partner Violence: Not on file  ? ? ?FAMILY HISTORY: ?Family History  ?Problem Relation Age of Onset  ? Breast cancer Paternal Aunt   ? ? ?ALLERGIES:  is allergic to chloraprep one step [chlorhexidine gluconate], latex, and tape. ? ?MEDICATIONS:  ?Current Outpatient Medications  ?Medication Sig Dispense Refill  ? ALPRAZolam (XANAX) 0.5 MG tablet  (Patient not taking: Reported on 05/20/2021)    ? calcium carbonate (OS-CAL) 1250 (500 Ca) MG chewable tablet Chew by mouth.    ? Cholecalciferol (VITAMIN D3) 5000  UNITS TABS Take by mouth.    ? estradiol (VIVELLE-DOT) 0.05 MG/24HR patch Place 1 patch onto the skin 2 (two) times a week.    ? ferrous sulfate 325 (65 FE) MG EC tablet Take 1 tablet (325 mg total) by mouth daily with breakfast.  3  ? glucosamine-chondroitin 500-400 MG tablet Take by mouth.    ? ibuprofen (ADVIL,MOTRIN) 200 MG tablet Take 200 mg by mouth.    ? itraconazole (SPORANOX) 100 MG capsule Take 2 capsules (200 mg total) by mouth 2 (two) times daily for 14 days. 56 capsule 0  ? Multiple Vitamins-Minerals (MULTIVITAMIN WITH MINERALS) tablet Take 1 tablet by mouth daily.    ? OMEGA-3 FATTY ACIDS-VITAMIN E PO 1,000 capsules. Take 2 capsules by mouth daily.    ? riTUXimab (RITUXAN) 100 MG/10ML injection See admin instructions.    ? zaleplon (SONATA) 10 MG capsule Take 10 mg by mouth at bedtime.    ? ?No current facility-administered medications for this visit.  ? ? ?REVIEW OF SYSTEMS:   ? ?10 Point review of Systems was done is negative except as noted above. ?PHYSICAL EXAMINATION: ?ECOG PERFORMANCE  STATUS: 1 - Symptomatic but completely ambulatory ?Vital signs reviewed ?GENERAL:alert, in no acute distress and comfortable ?SKIN: no acute rashes, no significant lesions ?EYES: conjunctiva are pink and non-injected, sclera anicteric ?OROPHARYNX: MMM, no exudates, no oropharyngeal erythema or ulceration ?NECK: supple, no JVD ?LYMPH:  no palpable lymphadenopathy in the cervical, axillary or inguinal regions ?LUNGS: clear to auscultation b/l with normal respiratory effort ?HEART: regular rate & rhythm ?ABDOMEN:  normoactive bowel sounds , non tender, not distended.  No palpable hepatosplenomegaly. ?Extremity: no pedal edema ?PSYCH: alert & oriented x 3 with fluent speech ?NEURO: no focal motor/sensory deficits ? ?LABORATORY DATA:  ?I have reviewed the data as listed ? ?. ? ?  Latest Ref Rng & Units 08/29/2021  ?  1:39 PM 07/15/2021  ?  8:49 AM 06/25/2021  ?  8:10 AM  ?CBC  ?WBC 4.0 - 10.5 K/uL 3.5   4.3   3.7     ?Hemoglobin 12.0 - 15.0 g/dL 12.6   12.1   11.6    ?Hematocrit 36.0 - 46.0 % 38.1   36.3   33.9    ?Platelets 150 - 400 K/uL 233   270   250    ? ? ?. ? ?  Latest Ref Rng & Units 08/29/2021  ?  1:39 PM 07/15/2021

## 2021-09-04 ENCOUNTER — Encounter: Payer: Self-pay | Admitting: Hematology and Oncology

## 2021-09-04 ENCOUNTER — Telehealth: Payer: Self-pay | Admitting: Hematology

## 2021-09-04 NOTE — Telephone Encounter (Signed)
Scheduled follow-up appointment per 5/10 los. Patient is aware. ?

## 2021-09-08 ENCOUNTER — Ambulatory Visit (HOSPITAL_COMMUNITY)
Admission: RE | Admit: 2021-09-08 | Discharge: 2021-09-08 | Disposition: A | Discharge status: Home / Self Care | Payer: Medicare Other | Source: Ambulatory Visit | Attending: Hematology and Oncology | Admitting: Hematology and Oncology

## 2021-09-08 DIAGNOSIS — C8580 Other specified types of non-Hodgkin lymphoma, unspecified site: Secondary | ICD-10-CM | POA: Diagnosis not present

## 2021-09-08 DIAGNOSIS — M898X9 Other specified disorders of bone, unspecified site: Secondary | ICD-10-CM | POA: Insufficient documentation

## 2021-09-08 LAB — GLUCOSE, CAPILLARY: Glucose-Capillary: 107 mg/dL — ABNORMAL HIGH (ref 70–99)

## 2021-09-08 MED ORDER — FLUDEOXYGLUCOSE F - 18 (FDG) INJECTION
6.0000 | Freq: Once | INTRAVENOUS | Status: AC | PRN
  Administered 2021-09-08: 5.98 via INTRAVENOUS

## 2021-09-10 ENCOUNTER — Inpatient Hospital Stay (HOSPITAL_BASED_OUTPATIENT_CLINIC_OR_DEPARTMENT_OTHER): Payer: Medicare Other | Admitting: Hematology

## 2021-09-10 ENCOUNTER — Encounter: Payer: Self-pay | Admitting: Hematology and Oncology

## 2021-09-10 DIAGNOSIS — C8580 Other specified types of non-Hodgkin lymphoma, unspecified site: Secondary | ICD-10-CM | POA: Diagnosis not present

## 2021-09-10 NOTE — Progress Notes (Signed)
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  HISTORY OF PRESENTING ILLNESS:  Please see previous note for details on initial presentation  INTERVAL HISTORY:  I connected with [place] on 09/10/2021 at 8:40 AM EST by telephone visit and verified that I am speaking with the correct person using two identifiers.   I discussed the limitations, risks, security and privacy concerns of performing an evaluation and management service by telemedicine and the availability of in-person appointments. I also discussed with the patient that there may be a patient responsible charge related to this service. The patient expressed understanding and agreed to proceed.   Other persons participating in the visit and their role in the encounter: None  Patient's location: Home Provider's location: [place] is a 66 y.o. female who was connected with via phone to discussed her recent PET CT scan results and continued evaluation and management of her marginal zone lymphoma. She reports She is doing well with no new symptoms or concerns.  She has decided to go with Bendamustine Rituxan chemotherapy.  No other new or acute focal symptoms.  We reviewed her PET CT scan done 09/08/2021 which revealed "1. Bulky axillary adenopathy periaortic adenopathy and iliac adenopathy with very low metabolic activity ( Deauville 2). Findings consistent with low-grade lymphoma. 2. Normal spleen. 3. Diffuse permeative pattern within the pelvis and spine. No hypermetabolic  skeletal lesions."   MEDICAL HISTORY:  Past Medical History:  Diagnosis Date   Cancer ([place]) 2016   marginal zone non-hodgkins lymphoma   Medical history non-contributory     SURGICAL HISTORY: Past Surgical History:  Procedure Laterality Date   ABDOMINAL HYSTERECTOMY     AXILLARY LYMPH NODE BIOPSY Left 06/29/2014   Procedure: AXILLARY LYMPH NODE BIOPSY;  Surgeon: Armandina Gemma, MD;  Location: [facility];  Service: General;  Laterality: Left;   COLONOSCOPY     DIAGNOSTIC LAPAROSCOPY  2005   BSO   MASS EXCISION N/A 02/23/2019   Procedure: Excision of umbilicus skin lesion;  Surgeon: Wallace Going, DO;  Location: [facility];  Service: Plastics;  Laterality: N/A;    SOCIAL HISTORY: Social History   Socioeconomic History   Marital status: Married    Spouse name: Not on file   Number of children: Not on file   Years of education: Not on file   Highest education level: Not on file  Occupational History   Not on file  Tobacco Use   Smoking status: Never   Smokeless tobacco: Never  Substance and Sexual Activity   Alcohol use: No   Drug use: No   Sexual activity: Not on file  Other Topics Concern   Not on file  Social History Narrative   Not on file   Social Determinants of Health   Financial Resource Strain: Not on file  Food Insecurity: Not on file  Transportation Needs: Not on file  Physical Activity: Not on file  Stress: Not on file  Social Connections: Not on file  Intimate Partner Violence: Not on file    FAMILY HISTORY: Family History  Problem Relation Age  of Onset   Breast cancer Paternal Aunt     ALLERGIES:  is allergic to chloraprep one step [chlorhexidine gluconate], latex, and tape.  MEDICATIONS:  Current Outpatient Medications  Medication Sig Dispense Refill   ALPRAZolam (XANAX) 0.5 MG tablet  (Patient not taking: Reported on 05/20/2021)     calcium carbonate (OS-CAL) 1250 (500 Ca) MG chewable tablet Chew by  mouth.     Cholecalciferol (VITAMIN D3) 5000 UNITS TABS Take by mouth.     estradiol (VIVELLE-DOT) 0.05 MG/24HR patch Place 1 patch onto the skin 2 (two) times a week.     ferrous sulfate 325 (65 FE) MG EC tablet Take 1 tablet (325 mg total) by mouth daily with breakfast.  3   glucosamine-chondroitin 500-400 MG tablet Take by mouth.     ibuprofen (ADVIL,MOTRIN) 200 MG tablet Take 200 mg by mouth.     Multiple Vitamins-Minerals (MULTIVITAMIN WITH MINERALS) tablet Take 1 tablet by mouth daily.     OMEGA-3 FATTY ACIDS-VITAMIN E PO 1,000 capsules. Take 2 capsules by mouth daily.     riTUXimab (RITUXAN) 100 MG/10ML injection See admin instructions. (Patient not taking: Reported on 09/03/2021)     zaleplon (SONATA) 10 MG capsule Take 10 mg by mouth at bedtime.     No current facility-administered medications for this visit.    REVIEW OF SYSTEMS:    10 Point review of Systems was done is negative except as noted above.  PHYSICAL EXAMINATION: Telemedicine appointment . Wt Readings from Last 3 Encounters:  08/28/21 120 lb (54.4 kg)  08/20/21 124 lb (56.2 kg)  07/15/21 128 lb 1.6 oz (58.1 kg)    LABORATORY DATA:  I have reviewed the data as listed  .    Latest Ref Rng & Units 08/29/2021    1:39 PM 07/15/2021    8:49 AM 06/25/2021    8:10 AM  CBC  WBC 4.0 - 10.5 K/uL 3.5   4.3   3.7    Hemoglobin 12.0 - 15.0 g/dL 12.6   12.1   11.6    Hematocrit 36.0 - 46.0 % 38.1   36.3   33.9    Platelets 150 - 400 K/uL 233   270   250      .    Latest Ref Rng & Units 08/29/2021    1:39 PM 07/15/2021    8:49 AM 06/25/2021    8:10 AM  CMP  Glucose 70 - 99 mg/dL 95   96   99    BUN 8 - 23 mg/dL $Remove'12   12   10    'DZwsSlE$ Creatinine 0.44 - 1.00 mg/dL 0.82   0.72   0.66    Sodium 135 - 145 mmol/L 137   139   138    Potassium 3.5 - 5.1 mmol/L 4.0   4.2   4.1    Chloride 98 - 111 mmol/L 106   109   106    CO2 22 - 32 mmol/L $RemoveB'26   27   26    'rCWubVpT$ Calcium 8.9 - 10.3 mg/dL 9.4   9.4   9.2    Total Protein 6.5 - 8.1 g/dL  6.5   6.5   6.1    Total Bilirubin 0.3 - 1.2 mg/dL 0.8   0.5   0.8    Alkaline Phos 38 - 126 U/L 142   179   169    AST 15 - 41 U/L 25   35   27  ALT 0 - 44 U/L $Remo'19   23   16     'zzZDJ$ . Lab Results  Component Value Date   LDH 161 08/29/2021     RADIOGRAPHIC STUDIES: I have personally reviewed the radiological images as listed and agreed with the findings in the report. CT CHEST ABDOMEN PELVIS W CONTRAST  Result Date: 08/26/2021 CLINICAL DATA:  Marginal zone lymphoma, follow-up * Tracking Code: BO * EXAM: CT CHEST, ABDOMEN, AND PELVIS WITH CONTRAST TECHNIQUE: Multidetector CT imaging of the chest, abdomen and pelvis was performed following the standard protocol during bolus administration of intravenous contrast. RADIATION DOSE REDUCTION: This exam was performed according to the departmental dose-optimization program which includes automated exposure control, adjustment of the mA and/or kV according to patient size and/or use of iterative reconstruction technique. CONTRAST:  128mL OMNIPAQUE IOHEXOL 300 MG/ML  SOLN COMPARISON:  PET-CT April 09, 2015 FINDINGS: CT CHEST FINDINGS Cardiovascular: Normal caliber abdominal aorta. No central pulmonary embolus on this nondedicated study. Normal size heart. No significant pericardial effusion/thickening. Mediastinum/Nodes: Pathologically enlarged supraclavicular, mediastinal, bilateral axillary and subpectoral lymph nodes. For reference: -left subpectoral lymph node measures 2.2 x 1.5 cm on image 17/2. -right paratracheal lymph node measures 1.7 x 1.1 cm on image 17/2. -right supraclavicular lymph node measures 2.6 x 1.2 cm on image 4/2. No discrete thyroid nodule.  Esophagus is grossly unremarkable. Lungs/Pleura: No suspicious pulmonary nodules or masses. No pleural effusion. No pneumothorax. Musculoskeletal: Prominent lucency involving the marrow in the proximal bilateral humeri as well as the metaphysis of the bilateral clavicles. Trabecular thickening of the  vertebral bodies without discrete osseous lesion. CT ABDOMEN PELVIS FINDINGS Hepatobiliary: No suspicious hepatic lesion. Gallbladder is unremarkable. No biliary ductal dilation. Pancreas: No pancreatic ductal dilation or evidence of acute inflammation. Spleen: No splenomegaly or focal splenic lesion. Adrenals/Urinary Tract: Bilateral adrenal glands appear normal. No hydronephrosis. Kidneys demonstrate symmetric enhancement and excretion of contrast material. Urinary bladder is unremarkable for degree of distension. Stomach/Bowel: Radiopaque enteric contrast material traverses the descending colon. Stomach is minimally distended limiting evaluation. There is no pathologic dilation of small or large bowel. The appendix is not confidently identified however there is no pericecal inflammation. Large volume of formed stool throughout the colon. Vascular/Lymphatic: Normal caliber abdominal aorta. Nodular soft tissue retroperitoneal thickening with encasement of the aorta/IVC and proximal branch vessels with lateralization of the ureters, with the soft tissue thickening extending down into the pelvis over the presacral space, reflecting confluent lymphomatous tissue/lymph nodes for reference: -aortocaval lymph node measures 3.6 x 1.6 cm on image 75/2 -left periaortic lymph node measures 2.8 x 2.4 cm on image 78/2. Enlarged periportal lymph node measures 2.6 x 1.3 cm on image 62/2. Enlarged iliac chain, pelvic sidewall and iliac lymph nodes. For reference: -left external iliac lymph node measures 4.0 x 2.4 cm on image 101/2 -Right external iliac lymph node measures 4.5 x 2.7 cm on image 97/2. Reproductive: Status post hysterectomy. No adnexal masses. Other: No significant abdominopelvic free fluid. Musculoskeletal: Multifocal lucencies throughout the pelvis and proximal femurs at least 1 of which appears to extend beyond the outer cortex in the iliac bone on image 97/5. IMPRESSION: 1. Pathologically enlarged lymph nodes  above and below the diaphragm, consistent with patient's known diagnosis of lymphoma. 2. Prominent lucency involving the marrow in the bilateral proximal humeri and clavicles with multifocal lucencies throughout the pelvis and proximal femurs at least 1 of which appears to extend beyond the outer cortex in the left iliac bone. Findings are nonspecific  and may reflect osseous lymphoma with permanent alternate differential considerations of multiple myeloma and significant bone demineralization. Consider further evaluation with laboratory testing and direct tissue sampling. 3. No splenomegaly or evidence of solid organ lymphomatous involvement. 4. Large volume of formed stool throughout the colon, suggesting constipation. Electronically Signed   By: Dahlia Bailiff M.D.   On: 08/26/2021 17:42   NM PET Image Restage (PS) Whole Body  Result Date: 09/09/2021 CLINICAL DATA:  Subsequent treatment strategy for marginal zone lymphoma. Bone pain. EXAM: NUCLEAR MEDICINE PET WHOLE BODY TECHNIQUE: 6.0 mCi F-18 FDG was injected intravenously. Full-ring PET imaging was performed from the head to foot after the radiotracer. CT data was obtained and used for attenuation correction and anatomic localization. Fasting blood glucose: 107 mg/dl COMPARISON:  None Available. FINDINGS: Mediastinal blood pool activity: SUV max 1.5 HEAD/NECK: No hypermetabolic activity in the scalp. No hypermetabolic cervical lymph nodes. Incidental CT findings: none CHEST: Bilateral large axial lymph nodes with out significant metabolic activity. For example LEFT axillary lymph node measuring 20 mm short axis with SUV max equal 1.7 (similar to background blood pool) Incidental CT findings: No suspicious pulmonary nodules. Mediastinal adenopathy. ABDOMEN/PELVIS: Bulky periaortic adenopathy with low metabolic activity. For example lymph node RIGHT of the aorta measures 32 mm with SUV max equal 2.2 (image 166). Bulky adenopathy along the LEFT and RIGHT  margin aorta without metabolic activity Bulky iliac adenopathy also with low metabolic activity. For example RIGHT external iliac lymph node measuring 2.9 cm (image 181) with SUV max equal 2.1. Prominent inguinal adenopathy. For example 2.1 cm LEFT inguinal node with SUV max equal 2.1. Normal spleen with normal metabolic activity Incidental CT findings: none SKELETON: There is a diffuse permeative pattern throughout the pelvis and spine without metabolic activity. Incidental CT findings: none EXTREMITIES: No abnormal hypermetabolic activity in the lower extremities. Incidental CT findings: none IMPRESSION: 1. Bulky axillary adenopathy periaortic adenopathy and iliac adenopathy with very low metabolic activity ( Deauville 2). Findings consistent with low-grade lymphoma. 2. Normal spleen. 3. Diffuse permeative pattern within the pelvis and spine. No hypermetabolic skeletal lesions. Electronically Signed   By: Suzy Bouchard M.D.   On: 09/09/2021 16:07    ASSESSMENT & PLAN:   66 year old female with  #1 Nodal marginal zone lymphoma diagnosed in 2016 Patient was diagnosed with nodal marginal zone lymphoma based on lymph node biopsy of her left axillary lymph node on 06/29/2014 and has been following with Dr. Jana Hakim for active surveillance and until recently did not meet criteria for consideration of treatment. In December 2019 on 04/12/2018 the patient had a biopsy of a right breast mass that showed pathology consistent with marginal zone lymphoma.  Patient over the last year or so has had increasing WBC counts which were as high as 91.2k in December 2022.  Patient also noted to have some anemia with hemoglobin of 11 and normal platelet counts of 234k in January 2023.  She also started developing some lymphadenopathy in the neck.  Patient is status post Rituxan every 3 weeks x 4 doses and recently was started on maintenance Rituxan  PET CT scan done 09/08/2021 revealed "1. Bulky axillary adenopathy  periaortic adenopathy and iliac adenopathy with very low metabolic activity ( Deauville 2). Findings consistent with low-grade lymphoma. 2. Normal spleen. 3. Diffuse permeative pattern within the pelvis and spine. No hypermetabolic skeletal lesions."  #2 Extensive bone involvement Plan -Patient is here for continued evaluation and management of her nodal marginal zone lymphoma and to review  the recent findings of her PET CT scan -We reviewed her recent PET CT scan done 09/08/2021 in detail. -Her labs today were reviewed with her in detail her WBC counts remain normal and her anemia has resolved with a hemoglobin of 12.6 and normal WBC count at this time. -She has decided to go with Bendamustine Rituxan chemotherapy as opposed to continued Rituxan maintenance. This decision was made by patient based on her preference to have time limited and more definitive treatment. -will use lower dose of Benadmustine @ $RemoveBefore'70mg'wsddgVfzbwdRj$ /m2 for C1 to evaluate for tolerance. -RTC with labs in 2 weeks for toxicity check.   Follow-up Plz schedule to start Bendamustine/Rituxan in 2 weeks with labs and MD visit Labs and MD in 2 weeks after C1D1 for toxicity check.  The total time spent in the appointment was 21 minutes*.  All of the patient's questions were answered with apparent satisfaction. The patient knows to call the clinic with any problems, questions or concerns.   Sullivan Lone MD MS AAHIVMS [facility] Ophthalmic Outpatient Surgery Hematology/Oncology Physician [facility]  .*Total Encounter Time as defined by the Centers for Medicare and Medicaid Services includes, in addition to the face-to-face time of a patient visit (documented in the note above) non-face-to-face time: obtaining and reviewing outside history, ordering and reviewing medications, tests or procedures, care coordination (communications with other health care professionals or caregivers) and documentation in the medical record.  I, Melene Muller, am acting as  scribe for Dr. Sullivan Lone, MD.  .I have reviewed the above documentation for accuracy and completeness, and I agree with the above. Brunetta Genera MD

## 2021-09-15 ENCOUNTER — Encounter: Payer: Self-pay | Admitting: Hematology

## 2021-09-15 ENCOUNTER — Telehealth: Payer: Self-pay | Admitting: Hematology

## 2021-09-15 NOTE — Telephone Encounter (Signed)
.  Called patient to schedule appointment per 5/22 inbasket, left pt msg

## 2021-09-15 NOTE — Progress Notes (Signed)
START ON PATHWAY REGIMEN - Lymphoma and CLL     A cycle is every 28 days:     Rituximab-xxxx      Bendamustine   **Always confirm dose/schedule in your pharmacy ordering system**  Patient Characteristics: Marginal Zone Lymphoma, Systemic, First Line, Symptomatic Disease Type: Marginal Zone Lymphoma Disease Type: Not Applicable Disease Type: Not Applicable Localized or Systemic Disease<= Systemic Line of Therapy: First Line Asymptomatic or Symptomatic<= Symptomatic Intent of Therapy: Non-Curative / Palliative Intent, Discussed with Patient 

## 2021-09-24 MED FILL — Dexamethasone Sodium Phosphate Inj 100 MG/10ML: INTRAMUSCULAR | Qty: 1 | Status: AC

## 2021-09-25 ENCOUNTER — Inpatient Hospital Stay: Payer: Medicare Other | Attending: Oncology

## 2021-09-25 ENCOUNTER — Other Ambulatory Visit: Payer: Self-pay

## 2021-09-25 ENCOUNTER — Other Ambulatory Visit: Payer: Self-pay | Admitting: Hematology

## 2021-09-25 ENCOUNTER — Inpatient Hospital Stay: Payer: Medicare Other

## 2021-09-25 VITALS — BP 134/64 | HR 62 | Temp 98.0°F | Resp 18 | Wt 124.8 lb

## 2021-09-25 DIAGNOSIS — D649 Anemia, unspecified: Secondary | ICD-10-CM

## 2021-09-25 DIAGNOSIS — Z7189 Other specified counseling: Secondary | ICD-10-CM

## 2021-09-25 DIAGNOSIS — C8580 Other specified types of non-Hodgkin lymphoma, unspecified site: Secondary | ICD-10-CM

## 2021-09-25 DIAGNOSIS — Z79899 Other long term (current) drug therapy: Secondary | ICD-10-CM | POA: Diagnosis not present

## 2021-09-25 DIAGNOSIS — Z803 Family history of malignant neoplasm of breast: Secondary | ICD-10-CM | POA: Insufficient documentation

## 2021-09-25 DIAGNOSIS — D508 Other iron deficiency anemias: Secondary | ICD-10-CM

## 2021-09-25 DIAGNOSIS — M8580 Other specified disorders of bone density and structure, unspecified site: Secondary | ICD-10-CM

## 2021-09-25 DIAGNOSIS — R519 Headache, unspecified: Secondary | ICD-10-CM | POA: Insufficient documentation

## 2021-09-25 DIAGNOSIS — Z5112 Encounter for antineoplastic immunotherapy: Secondary | ICD-10-CM | POA: Diagnosis not present

## 2021-09-25 DIAGNOSIS — S31105A Unspecified open wound of abdominal wall, periumbilic region without penetration into peritoneal cavity, initial encounter: Secondary | ICD-10-CM

## 2021-09-25 DIAGNOSIS — B430 Cutaneous chromomycosis: Secondary | ICD-10-CM

## 2021-09-25 LAB — CMP (CANCER CENTER ONLY)
ALT: 14 U/L (ref 0–44)
AST: 23 U/L (ref 15–41)
Albumin: 3.8 g/dL (ref 3.5–5.0)
Alkaline Phosphatase: 164 U/L — ABNORMAL HIGH (ref 38–126)
Anion gap: 5 (ref 5–15)
BUN: 16 mg/dL (ref 8–23)
CO2: 26 mmol/L (ref 22–32)
Calcium: 9.4 mg/dL (ref 8.9–10.3)
Chloride: 107 mmol/L (ref 98–111)
Creatinine: 0.7 mg/dL (ref 0.44–1.00)
GFR, Estimated: >60 mL/min (ref >60)
Glucose, Bld: 124 mg/dL — ABNORMAL HIGH (ref 70–99)
Potassium: 3.9 mmol/L (ref 3.5–5.1)
Sodium: 138 mmol/L (ref 135–145)
Total Bilirubin: 0.3 mg/dL (ref 0.3–1.2)
Total Protein: 5.8 g/dL — ABNORMAL LOW (ref 6.5–8.1)

## 2021-09-25 LAB — URIC ACID: Uric Acid, Serum: 3.3 mg/dL (ref 2.5–7.1)

## 2021-09-25 LAB — CBC WITH DIFFERENTIAL (CANCER CENTER ONLY)
Abs Immature Granulocytes: 0.02 10*3/uL (ref 0.00–0.07)
Basophils Absolute: 0.1 10*3/uL (ref 0.0–0.1)
Basophils Relative: 1 %
Eosinophils Absolute: 0.3 10*3/uL (ref 0.0–0.5)
Eosinophils Relative: 4 %
HCT: 37 % (ref 36.0–46.0)
Hemoglobin: 12.7 g/dL (ref 12.0–15.0)
Immature Granulocytes: 0 %
Lymphocytes Relative: 63 %
Lymphs Abs: 4.3 10*3/uL — ABNORMAL HIGH (ref 0.7–4.0)
MCH: 30.9 pg (ref 26.0–34.0)
MCHC: 34.3 g/dL (ref 30.0–36.0)
MCV: 90 fL (ref 80.0–100.0)
Monocytes Absolute: 0.3 10*3/uL (ref 0.1–1.0)
Monocytes Relative: 5 %
Neutro Abs: 1.8 10*3/uL (ref 1.7–7.7)
Neutrophils Relative %: 27 %
Platelet Count: 247 10*3/uL (ref 150–400)
RBC: 4.11 MIL/uL (ref 3.87–5.11)
RDW: 14.5 % (ref 11.5–15.5)
WBC Count: 6.8 10*3/uL (ref 4.0–10.5)
nRBC: 0 % (ref 0.0–0.2)

## 2021-09-25 LAB — LACTATE DEHYDROGENASE: LDH: 150 U/L (ref 98–192)

## 2021-09-25 LAB — VITAMIN D 25 HYDROXY (VIT D DEFICIENCY, FRACTURES): Vit D, 25-Hydroxy: 40.59 ng/mL (ref 30–100)

## 2021-09-25 LAB — VITAMIN B12: Vitamin B-12: 607 pg/mL (ref 180–914)

## 2021-09-25 MED ORDER — SODIUM CHLORIDE 0.9 % IV SOLN
375.0000 mg/m2 | Freq: Once | INTRAVENOUS | Status: AC
  Administered 2021-09-25: 600 mg via INTRAVENOUS
  Filled 2021-09-25: qty 50

## 2021-09-25 MED ORDER — SODIUM CHLORIDE 0.9 % IV SOLN
10.0000 mg | Freq: Once | INTRAVENOUS | Status: AC
  Administered 2021-09-25: 10 mg via INTRAVENOUS
  Filled 2021-09-25: qty 10

## 2021-09-25 MED ORDER — HEPARIN SOD (PORK) LOCK FLUSH 100 UNIT/ML IV SOLN: 500.0000 [IU] | Freq: Once | INTRAVENOUS | Status: DC | PRN

## 2021-09-25 MED ORDER — PALONOSETRON HCL INJECTION 0.25 MG/5ML
0.2500 mg | Freq: Once | INTRAVENOUS | Status: AC
  Administered 2021-09-25: 0.25 mg via INTRAVENOUS
  Filled 2021-09-25: qty 5

## 2021-09-25 MED ORDER — SODIUM CHLORIDE 0.9% FLUSH: 10.0000 mL | INTRAVENOUS | Status: DC | PRN

## 2021-09-25 MED ORDER — ACYCLOVIR 400 MG PO TABS: 400.0000 mg | ORAL_TABLET | Freq: Every day | ORAL | 3 refills | Status: DC

## 2021-09-25 MED ORDER — ONDANSETRON HCL 8 MG PO TABS: 8.0000 mg | ORAL_TABLET | Freq: Two times a day (BID) | ORAL | 1 refills | Status: AC | PRN

## 2021-09-25 MED ORDER — SODIUM CHLORIDE 0.9 % IV SOLN
70.0000 mg/m2 | Freq: Once | INTRAVENOUS | Status: AC
  Administered 2021-09-25: 100 mg via INTRAVENOUS
  Filled 2021-09-25: qty 4

## 2021-09-25 MED ORDER — DEXAMETHASONE 4 MG PO TABS: 8.0000 mg | ORAL_TABLET | Freq: Every day | ORAL | 1 refills | Status: AC

## 2021-09-25 MED ORDER — PROCHLORPERAZINE MALEATE 10 MG PO TABS: 10.0000 mg | ORAL_TABLET | Freq: Four times a day (QID) | ORAL | 1 refills | Status: AC | PRN

## 2021-09-25 MED ORDER — SODIUM CHLORIDE 0.9 % IV SOLN: Freq: Once | INTRAVENOUS | Status: AC

## 2021-09-25 MED ORDER — DIPHENHYDRAMINE HCL 25 MG PO CAPS
50.0000 mg | ORAL_CAPSULE | Freq: Once | ORAL | Status: AC
  Administered 2021-09-25: 50 mg via ORAL
  Filled 2021-09-25: qty 2

## 2021-09-25 MED ORDER — ACETAMINOPHEN 325 MG PO TABS
650.0000 mg | ORAL_TABLET | Freq: Once | ORAL | Status: AC
  Administered 2021-09-25: 650 mg via ORAL
  Filled 2021-09-25: qty 2

## 2021-09-25 MED FILL — Dexamethasone Sodium Phosphate Inj 100 MG/10ML: INTRAMUSCULAR | Qty: 1 | Status: AC

## 2021-09-25 NOTE — Patient Instructions (Signed)
[facility]  Discharge Instructions: Thank you for choosing [facility] to provide your oncology and hematology care.   If you have a lab appointment with the Cancer Center, please go directly to the Cancer Center and check in at the registration area.   Wear comfortable clothing and clothing appropriate for easy access to any Portacath or PICC line.   We strive to give you quality time with your provider. You may need to reschedule your appointment if you arrive late (15 or more minutes).  Arriving late affects you and other patients whose appointments are after yours.  Also, if you miss three or more appointments without notifying the office, you may be dismissed from the clinic at the provider's discretion.      For prescription refill requests, have your pharmacy contact our office and allow 72 hours for refills to be completed.    Today you received the following chemotherapy and/or immunotherapy agents: Rituximab, Bendeka.      To help prevent nausea and vomiting after your treatment, we encourage you to take your nausea medication as directed.  BELOW ARE SYMPTOMS THAT SHOULD BE REPORTED IMMEDIATELY: *FEVER GREATER THAN 100.4 F (38 C) OR HIGHER *CHILLS OR SWEATING *NAUSEA AND VOMITING THAT IS NOT CONTROLLED WITH YOUR NAUSEA MEDICATION *UNUSUAL SHORTNESS OF BREATH *UNUSUAL BRUISING OR BLEEDING *URINARY PROBLEMS (pain or burning when urinating, or frequent urination) *BOWEL PROBLEMS (unusual diarrhea, constipation, pain near the anus) TENDERNESS IN MOUTH AND THROAT WITH OR WITHOUT PRESENCE OF ULCERS (sore throat, sores in mouth, or a toothache) UNUSUAL RASH, SWELLING OR PAIN  UNUSUAL VAGINAL DISCHARGE OR ITCHING   Items with * indicate a potential emergency and should be followed up as soon as possible or go to the Emergency Department if any problems should occur.  Please show the CHEMOTHERAPY ALERT CARD or IMMUNOTHERAPY ALERT CARD at  check-in to the Emergency Department and triage nurse.  Should you have questions after your visit or need to cancel or reschedule your appointment, please contact [facility]  Dept: [phone]  and follow the prompts.  Office hours are 8:00 a.m. to 4:30 p.m. Monday - Friday. Please note that voicemails left after 4:00 p.m. may not be returned until the following business day.  We are closed weekends and major holidays. You have access to a nurse at all times for urgent questions. Please call the main number to the clinic Dept: [phone] and follow the prompts.   For any non-urgent questions, you may also contact your provider using MyChart. We now offer e-Visits for anyone 18 and older to request care online for non-urgent symptoms. For details visit [email or web address].   Also download the MyChart app! Go to the app store, search "MyChart", open the app, select [facility], and log in with your MyChart username and password.  Due to Covid, a mask is required upon entering the hospital/clinic. If you do not have a mask, one will be given to you upon arrival. For doctor visits, patients may have 1 support person aged 18 or older with them. For treatment visits, patients cannot have anyone with them due to current Covid guidelines and our immunocompromised population.   Bendamustine Injection What is this medication? BENDAMUSTINE (BEN da MUS teen) is a chemotherapy drug. It is used to treat chronic lymphocytic leukemia and non-Hodgkin lymphoma. This medicine may be used for other purposes; ask your health care provider or pharmacist if you have questions. COMMON BRAND NAME(S):   BELRAPZO, BENDEKA, Treanda, VIVIMUSTA What should I tell my care team before I take this medication? They need to know if you have any of these conditions: infection (especially a virus infection such as chickenpox, cold sores, or herpes) kidney disease liver disease an unusual or  allergic reaction to bendamustine, mannitol, other medicines, foods, dyes, or preservatives pregnant or trying to get pregnant breast-feeding How should I use this medication? This medicine is for infusion into a vein. It is given by a health care professional in a hospital or clinic setting. Talk to your pediatrician regarding the use of this medicine in children. Special care may be needed. Overdosage: If you think you have taken too much of this medicine contact a poison control center or emergency room at once. NOTE: This medicine is only for you. Do not share this medicine with others. What if I miss a dose? It is important not to miss your dose. Call your doctor or health care professional if you are unable to keep an appointment. What may interact with this medication? Do not take this medicine with any of the following medications: clozapine This medicine may also interact with the following medications: atazanavir cimetidine ciprofloxacin enoxacin fluvoxamine medicines for seizures like carbamazepine and phenobarbital mexiletine rifampin tacrine thiabendazole zileuton This list may not describe all possible interactions. Give your health care provider a list of all the medicines, herbs, non-prescription drugs, or dietary supplements you use. Also tell them if you smoke, drink alcohol, or use illegal drugs. Some items may interact with your medicine. What should I watch for while using this medication? This drug may make you feel generally unwell. This is not uncommon, as chemotherapy can affect healthy cells as well as cancer cells. Report any side effects. Continue your course of treatment even though you feel ill unless your doctor tells you to stop. You may need blood work done while you are taking this medicine. Call your doctor or healthcare provider for advice if you get a fever, chills or sore throat, or other symptoms of a cold or flu. Do not treat yourself. This drug  decreases your body's ability to fight infections. Try to avoid being around people who are sick. This medicine may cause serious skin reactions. They can happen weeks to months after starting the medicine. Contact your healthcare provider right away if you notice fevers or flu-like symptoms with a rash. The rash may be red or purple and then turn into blisters or peeling of the skin. Or, you might notice a red rash with swelling of the face, lips or lymph nodes in your neck or under your arms. In some patients, this medicine may cause a serious brain infection that may cause death. If you have any problems seeing, thinking, speaking, walking, or standing, tell your health care provider right away. If you cannot reach your health care provider, urgently seek other source of medical care. This medicine may increase your risk to bruise or bleed. Call your doctor or healthcare provider if you notice any unusual bleeding. Talk to your doctor about your risk of cancer. You may be more at risk for certain types of cancers if you take this medicine. This medicine may increase your risk of skin cancer. Check your skin for changes to moles or for new growths while taking this medicine. Call your health care provider if you notice any of these skin changes. Do not become pregnant while taking this medicine or for at least 6 months after stopping it.   Women should inform their doctor if they wish to become pregnant or think they might be pregnant. Men should not father a child while taking this medicine and for at least 3 months after stopping it. There is a potential for serious side effects to an unborn child. Talk to your healthcare provider or pharmacist for more information. Do not breast-feed an infant while taking this medicine or for at least 1 week after stopping it. This medicine may make it more difficult to father a child. You should talk with your doctor or healthcare provider if you are concerned about your  fertility. What side effects may I notice from receiving this medication? Side effects that you should report to your doctor or health care professional as soon as possible: allergic reactions like skin rash, itching or hives, swelling of the face, lips, or tongue low blood counts - this medicine may decrease the number of white blood cells, red blood cells and platelets. You may be at increased risk for infections and bleeding. rash, fever, and swollen lymph nodes redness, blistering, peeling, or loosening of the skin, including inside the mouth signs of infection like fever or chills, cough, sore throat, pain or difficulty passing urine signs of decreased platelets or bleeding like bruising, pinpoint red spots on the skin, black, tarry stools, blood in the urine signs of decreased red blood cells like being unusually weak or tired, fainting spells, lightheadedness signs and symptoms of kidney injury like trouble passing urine or change in the amount of urine signs and symptoms of liver injury like dark yellow or brown urine; general ill feeling or flu-like symptoms; light-colored stools; loss of appetite; nausea; right upper belly pain; unusually weak or tired; yellowing of the eyes or skin Side effects that usually do not require medical attention (report to your doctor or health care professional if they continue or are bothersome): constipation decreased appetite diarrhea headache mouth sores nausea, vomiting tiredness This list may not describe all possible side effects. Call your doctor for medical advice about side effects. You may report side effects to FDA at 1-800-FDA-1088. Where should I keep my medication? This drug is given in a hospital or clinic and will not be stored at home. NOTE: This sheet is a summary. It may not cover all possible information. If you have questions about this medicine, talk to your doctor, pharmacist, or health care provider.  2023 Elsevier/Gold  Standard (2019-10-10 00:00:00) 

## 2021-09-25 NOTE — Progress Notes (Signed)
vitamin

## 2021-09-26 ENCOUNTER — Inpatient Hospital Stay: Payer: Medicare Other

## 2021-09-26 ENCOUNTER — Telehealth: Payer: Self-pay

## 2021-09-26 VITALS — BP 138/67 | HR 83 | Temp 97.8°F | Resp 18

## 2021-09-26 DIAGNOSIS — Z5112 Encounter for antineoplastic immunotherapy: Secondary | ICD-10-CM | POA: Diagnosis not present

## 2021-09-26 DIAGNOSIS — C8580 Other specified types of non-Hodgkin lymphoma, unspecified site: Secondary | ICD-10-CM

## 2021-09-26 MED ORDER — SODIUM CHLORIDE 0.9 % IV SOLN: Freq: Once | INTRAVENOUS | Status: AC

## 2021-09-26 MED ORDER — SODIUM CHLORIDE 0.9 % IV SOLN
10.0000 mg | Freq: Once | INTRAVENOUS | Status: AC
  Administered 2021-09-26: 10 mg via INTRAVENOUS
  Filled 2021-09-26: qty 10

## 2021-09-26 MED ORDER — SODIUM CHLORIDE 0.9 % IV SOLN
70.0000 mg/m2 | Freq: Once | INTRAVENOUS | Status: AC
  Administered 2021-09-26: 100 mg via INTRAVENOUS
  Filled 2021-09-26: qty 4

## 2021-09-26 NOTE — Patient Instructions (Signed)
[facility]  Discharge Instructions: Thank you for choosing [facility] to provide your oncology and hematology care.   If you have a lab appointment with the [facility], please go directly to the [facility] and check in at the registration area.   Wear comfortable clothing and clothing appropriate for easy access to any Portacath or PICC line.   We strive to give you quality time with your provider. You may need to reschedule your appointment if you arrive late (15 or more minutes).  Arriving late affects you and other patients whose appointments are after yours.  Also, if you miss three or more appointments without notifying the office, you may be dismissed from the clinic at the provider's discretion.      For prescription refill requests, have your pharmacy contact our office and allow 72 hours for refills to be completed.    Today you received the following chemotherapy and/or immunotherapy agents: Bendeka.       To help prevent nausea and vomiting after your treatment, we encourage you to take your nausea medication as directed.  BELOW ARE SYMPTOMS THAT SHOULD BE REPORTED IMMEDIATELY: *FEVER GREATER THAN 100.4 F (38 C) OR HIGHER *CHILLS OR SWEATING *NAUSEA AND VOMITING THAT IS NOT CONTROLLED WITH YOUR NAUSEA MEDICATION *UNUSUAL SHORTNESS OF BREATH *UNUSUAL BRUISING OR BLEEDING *URINARY PROBLEMS (pain or burning when urinating, or frequent urination) *BOWEL PROBLEMS (unusual diarrhea, constipation, pain near the anus) TENDERNESS IN MOUTH AND THROAT WITH OR WITHOUT PRESENCE OF ULCERS (sore throat, sores in mouth, or a toothache) UNUSUAL RASH, SWELLING OR PAIN  UNUSUAL VAGINAL DISCHARGE OR ITCHING   Items with * indicate a potential emergency and should be followed up as soon as possible or go to the Emergency Department if any problems should occur.  Please show the CHEMOTHERAPY ALERT CARD or IMMUNOTHERAPY ALERT CARD at check-in to  the Emergency Department and triage nurse.  Should you have questions after your visit or need to cancel or reschedule your appointment, please contact [facility]  Dept: [phone]  and follow the prompts.  Office hours are 8:00 a.m. to 4:30 p.m. Monday - Friday. Please note that voicemails left after 4:00 p.m. may not be returned until the following business day.  We are closed weekends and major holidays. You have access to a nurse at all times for urgent questions. Please call the main number to the clinic Dept: [phone] and follow the prompts.   For any non-urgent questions, you may also contact your provider using MyChart. We now offer e-Visits for anyone 83 and older to request care online for non-urgent symptoms. For details visit [email or web address].   Also download the MyChart app! Go to the app store, search "MyChart", open the app, select [facility], and log in with your MyChart username and password.  Due to Covid, a mask is required upon entering the hospital/clinic. If you do not have a mask, one will be given to you upon arrival. For doctor visits, patients may have 1 support person aged 64 or older with them. For treatment visits, patients cannot have anyone with them due to current Covid guidelines and our immunocompromised population.

## 2021-09-26 NOTE — Telephone Encounter (Signed)
LM for patient that this nurse was calling to see how they were doing after their treatment. Please call back to Dr. Kale's nurse at [phone] if they have any questions or concerns regarding the treatment. 

## 2021-09-26 NOTE — Telephone Encounter (Signed)
-----   Message from Anastasia Pall, RN sent at 09/25/2021 12:16 PM EDT ----- Regarding: 1st time Bendeka infusion, being seen by Dr. Lanae Crumbly, Ms. Murin received her 1st bendeka treatment today. She tolerated it well. Please follow-up with her. Thank you.

## 2021-10-03 ENCOUNTER — Telehealth: Payer: Self-pay

## 2021-10-03 NOTE — Telephone Encounter (Signed)
Returned call to pt. Pt states she has had a headache since day 2 post treatment. Pt does not normally have headaches on a regular basis. Pt states headache has been on and off daily and it gets worse in the pm. Pt has tried ES Tylenol and Motrin without much relief. Pt states it is an all over headache. Dr Irene Limbo will send in prescription for Fioricet. Pt acknowledged and verbalized understanding.

## 2021-10-06 ENCOUNTER — Other Ambulatory Visit: Payer: Self-pay | Admitting: Hematology

## 2021-10-06 MED ORDER — BUTALBITAL-APAP-CAFFEINE 50-325-40 MG PO TABS: 1.0000 | ORAL_TABLET | Freq: Four times a day (QID) | ORAL | 0 refills | Status: AC | PRN

## 2021-10-10 ENCOUNTER — Inpatient Hospital Stay (HOSPITAL_BASED_OUTPATIENT_CLINIC_OR_DEPARTMENT_OTHER): Payer: Medicare Other | Admitting: Hematology

## 2021-10-10 ENCOUNTER — Other Ambulatory Visit: Payer: Self-pay

## 2021-10-10 ENCOUNTER — Inpatient Hospital Stay: Payer: Medicare Other

## 2021-10-10 VITALS — BP 128/70 | HR 64 | Temp 97.7°F | Resp 16 | Ht 62.0 in | Wt 124.8 lb

## 2021-10-10 DIAGNOSIS — R519 Headache, unspecified: Secondary | ICD-10-CM

## 2021-10-10 DIAGNOSIS — Z5112 Encounter for antineoplastic immunotherapy: Secondary | ICD-10-CM | POA: Diagnosis not present

## 2021-10-10 DIAGNOSIS — C8580 Other specified types of non-Hodgkin lymphoma, unspecified site: Secondary | ICD-10-CM

## 2021-10-10 LAB — CMP (CANCER CENTER ONLY)
ALT: 27 U/L (ref 0–44)
AST: 28 U/L (ref 15–41)
Albumin: 3.7 g/dL (ref 3.5–5.0)
Alkaline Phosphatase: 146 U/L — ABNORMAL HIGH (ref 38–126)
Anion gap: 4 — ABNORMAL LOW (ref 5–15)
BUN: 14 mg/dL (ref 8–23)
CO2: 29 mmol/L (ref 22–32)
Calcium: 9.3 mg/dL (ref 8.9–10.3)
Chloride: 105 mmol/L (ref 98–111)
Creatinine: 0.71 mg/dL (ref 0.44–1.00)
GFR, Estimated: >60 mL/min (ref >60)
Glucose, Bld: 81 mg/dL (ref 70–99)
Potassium: 4.1 mmol/L (ref 3.5–5.1)
Sodium: 138 mmol/L (ref 135–145)
Total Bilirubin: 0.4 mg/dL (ref 0.3–1.2)
Total Protein: 5.9 g/dL — ABNORMAL LOW (ref 6.5–8.1)

## 2021-10-10 LAB — CBC WITH DIFFERENTIAL (CANCER CENTER ONLY)
Abs Immature Granulocytes: 0.01 10*3/uL (ref 0.00–0.07)
Basophils Absolute: 0.1 10*3/uL (ref 0.0–0.1)
Basophils Relative: 2 %
Eosinophils Absolute: 0.3 10*3/uL (ref 0.0–0.5)
Eosinophils Relative: 10 %
HCT: 34.3 % — ABNORMAL LOW (ref 36.0–46.0)
Hemoglobin: 11.7 g/dL — ABNORMAL LOW (ref 12.0–15.0)
Immature Granulocytes: 0 %
Lymphocytes Relative: 19 %
Lymphs Abs: 0.6 10*3/uL — ABNORMAL LOW (ref 0.7–4.0)
MCH: 30.6 pg (ref 26.0–34.0)
MCHC: 34.1 g/dL (ref 30.0–36.0)
MCV: 89.8 fL (ref 80.0–100.0)
Monocytes Absolute: 0.4 10*3/uL (ref 0.1–1.0)
Monocytes Relative: 13 %
Neutro Abs: 1.7 10*3/uL (ref 1.7–7.7)
Neutrophils Relative %: 56 %
Platelet Count: 219 10*3/uL (ref 150–400)
RBC: 3.82 MIL/uL — ABNORMAL LOW (ref 3.87–5.11)
RDW: 15.4 % (ref 11.5–15.5)
WBC Count: 3 10*3/uL — ABNORMAL LOW (ref 4.0–10.5)
nRBC: 0 % (ref 0.0–0.2)

## 2021-10-10 LAB — LACTATE DEHYDROGENASE: LDH: 128 U/L (ref 98–192)

## 2021-10-15 ENCOUNTER — Telehealth: Payer: Self-pay | Admitting: Hematology

## 2021-10-15 ENCOUNTER — Ambulatory Visit: Payer: 59 | Admitting: Hematology and Oncology

## 2021-10-15 ENCOUNTER — Other Ambulatory Visit: Payer: 59

## 2021-10-15 ENCOUNTER — Ambulatory Visit: Payer: 59

## 2021-10-15 NOTE — Telephone Encounter (Signed)
Left message with follow-up appointments per 6/16 los.

## 2021-10-16 ENCOUNTER — Encounter: Payer: Self-pay | Admitting: Hematology

## 2021-10-22 ENCOUNTER — Ambulatory Visit (INDEPENDENT_AMBULATORY_CARE_PROVIDER_SITE_OTHER): Payer: Medicare Other | Admitting: Internal Medicine

## 2021-10-22 ENCOUNTER — Encounter: Payer: Self-pay | Admitting: Internal Medicine

## 2021-10-22 ENCOUNTER — Other Ambulatory Visit: Payer: Self-pay

## 2021-10-22 DIAGNOSIS — B430 Cutaneous chromomycosis: Secondary | ICD-10-CM

## 2021-10-22 NOTE — Progress Notes (Signed)
Virtual Visit via Telephone Note  I connected with Dana Butler on 10/22/21 at  9:30 AM EDT by telephone and verified that I am speaking with the correct person using two identifiers.  Location: Patient: Home Provider: [facility]   I discussed the limitations, risks, security and privacy concerns of performing an evaluation and management service by telephone and the availability of in person appointments. I also discussed with the patient that there may be a patient responsible charge related to this service. The patient expressed understanding and agreed to proceed.   History of Present Illness: I called and spoke with Dana Butler today.  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  I treated her with itraconazole for 3 and half months.  She completed therapy on 09/03/2021.  She recently had follow-up with her dermatologist.  She has not had any new lesions or recurrence of her existing lesion on her left elbow.   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  Follow Up Instructions: Follow-up here as needed   I discussed the assessment and treatment plan with the patient. The patient was provided an opportunity to ask questions and all were answered. The patient agreed with the plan and demonstrated an understanding of the instructions.   The patient was advised to call back or seek an in-person evaluation if the symptoms worsen or if the condition fails to improve as anticipated.  I provided 13 minutes of non-face-to-face time during this encounter.   Michel Bickers, MD

## 2021-10-27 ENCOUNTER — Other Ambulatory Visit: Payer: Self-pay | Admitting: Hematology

## 2021-10-27 ENCOUNTER — Inpatient Hospital Stay: Payer: Medicare Other | Attending: Hematology

## 2021-10-27 ENCOUNTER — Inpatient Hospital Stay (HOSPITAL_BASED_OUTPATIENT_CLINIC_OR_DEPARTMENT_OTHER): Payer: Medicare Other | Admitting: Hematology

## 2021-10-27 ENCOUNTER — Other Ambulatory Visit: Payer: Self-pay

## 2021-10-27 ENCOUNTER — Inpatient Hospital Stay: Payer: Medicare Other

## 2021-10-27 VITALS — BP 128/69 | HR 77 | Temp 98.5°F | Resp 16 | Wt 127.2 lb

## 2021-10-27 DIAGNOSIS — Z803 Family history of malignant neoplasm of breast: Secondary | ICD-10-CM | POA: Diagnosis not present

## 2021-10-27 DIAGNOSIS — Z5111 Encounter for antineoplastic chemotherapy: Secondary | ICD-10-CM | POA: Insufficient documentation

## 2021-10-27 DIAGNOSIS — C8580 Other specified types of non-Hodgkin lymphoma, unspecified site: Secondary | ICD-10-CM | POA: Insufficient documentation

## 2021-10-27 DIAGNOSIS — Z5112 Encounter for antineoplastic immunotherapy: Secondary | ICD-10-CM | POA: Diagnosis present

## 2021-10-27 LAB — CBC WITH DIFFERENTIAL (CANCER CENTER ONLY)
Abs Immature Granulocytes: 0.01 10*3/uL (ref 0.00–0.07)
Basophils Absolute: 0.1 10*3/uL (ref 0.0–0.1)
Basophils Relative: 2 %
Eosinophils Absolute: 0.7 10*3/uL — ABNORMAL HIGH (ref 0.0–0.5)
Eosinophils Relative: 13 %
HCT: 35.2 % — ABNORMAL LOW (ref 36.0–46.0)
Hemoglobin: 12.1 g/dL (ref 12.0–15.0)
Immature Granulocytes: 0 %
Lymphocytes Relative: 14 %
Lymphs Abs: 0.7 10*3/uL (ref 0.7–4.0)
MCH: 30.6 pg (ref 26.0–34.0)
MCHC: 34.4 g/dL (ref 30.0–36.0)
MCV: 89.1 fL (ref 80.0–100.0)
Monocytes Absolute: 0.4 10*3/uL (ref 0.1–1.0)
Monocytes Relative: 8 %
Neutro Abs: 3.2 10*3/uL (ref 1.7–7.7)
Neutrophils Relative %: 63 %
Platelet Count: 255 10*3/uL (ref 150–400)
RBC: 3.95 MIL/uL (ref 3.87–5.11)
RDW: 15.7 % — ABNORMAL HIGH (ref 11.5–15.5)
WBC Count: 5.2 10*3/uL (ref 4.0–10.5)
nRBC: 0 % (ref 0.0–0.2)

## 2021-10-27 LAB — CMP (CANCER CENTER ONLY)
ALT: 20 U/L (ref 0–44)
AST: 27 U/L (ref 15–41)
Albumin: 3.8 g/dL (ref 3.5–5.0)
Alkaline Phosphatase: 173 U/L — ABNORMAL HIGH (ref 38–126)
Anion gap: 4 — ABNORMAL LOW (ref 5–15)
BUN: 13 mg/dL (ref 8–23)
CO2: 28 mmol/L (ref 22–32)
Calcium: 9 mg/dL (ref 8.9–10.3)
Chloride: 106 mmol/L (ref 98–111)
Creatinine: 0.67 mg/dL (ref 0.44–1.00)
GFR, Estimated: >60 mL/min (ref >60)
Glucose, Bld: 93 mg/dL (ref 70–99)
Potassium: 4.1 mmol/L (ref 3.5–5.1)
Sodium: 138 mmol/L (ref 135–145)
Total Bilirubin: 0.3 mg/dL (ref 0.3–1.2)
Total Protein: 6.2 g/dL — ABNORMAL LOW (ref 6.5–8.1)

## 2021-10-27 LAB — LACTATE DEHYDROGENASE: LDH: 149 U/L (ref 98–192)

## 2021-10-27 MED ORDER — SODIUM CHLORIDE 0.9 % IV SOLN
70.0000 mg/m2 | Freq: Once | INTRAVENOUS | Status: AC
  Administered 2021-10-27: 100 mg via INTRAVENOUS
  Filled 2021-10-27: qty 4

## 2021-10-27 MED ORDER — PALONOSETRON HCL INJECTION 0.25 MG/5ML
0.2500 mg | Freq: Once | INTRAVENOUS | Status: AC
  Administered 2021-10-27: 0.25 mg via INTRAVENOUS
  Filled 2021-10-27: qty 5

## 2021-10-27 MED ORDER — SODIUM CHLORIDE 0.9 % IV SOLN
375.0000 mg/m2 | Freq: Once | INTRAVENOUS | Status: AC
  Administered 2021-10-27: 600 mg via INTRAVENOUS
  Filled 2021-10-27: qty 50

## 2021-10-27 MED ORDER — SODIUM CHLORIDE 0.9 % IV SOLN
10.0000 mg | Freq: Once | INTRAVENOUS | Status: AC
  Administered 2021-10-27: 10 mg via INTRAVENOUS
  Filled 2021-10-27: qty 10

## 2021-10-27 MED ORDER — SODIUM CHLORIDE 0.9 % IV SOLN: Freq: Once | INTRAVENOUS | Status: AC

## 2021-10-27 MED ORDER — ACETAMINOPHEN 325 MG PO TABS
650.0000 mg | ORAL_TABLET | Freq: Once | ORAL | Status: AC
  Administered 2021-10-27: 650 mg via ORAL
  Filled 2021-10-27: qty 2

## 2021-10-27 MED ORDER — DIPHENHYDRAMINE HCL 25 MG PO CAPS
50.0000 mg | ORAL_CAPSULE | Freq: Once | ORAL | Status: AC
  Administered 2021-10-27: 50 mg via ORAL
  Filled 2021-10-27: qty 2

## 2021-10-27 NOTE — Patient Instructions (Signed)
[facility] ONCOL[facility]  Discharge Instructions: Thank you for choosing [facility] to provide your oncology and hematology care.   If you have a lab appointment with the [facility], please go directly to the [facility] and check in at the registration area.   Wear comfortable clothing and clothing appropriate for easy access to any Portacath or PICC line.   We strive to give you quality time with your provider. You may need to reschedule your appointment if you arrive late (15 or more minutes).  Arriving late affects you and other patients whose appointments are after yours.  Also, if you miss three or more appointments without notifying the office, you may be dismissed from the clinic at the provider's discretion.      For prescription refill requests, have your pharmacy contact our office and allow 72 hours for refills to be completed.    Today you received the following chemotherapy and/or immunotherapy agents: Rituximab, Bendeka.      To help prevent nausea and vomiting after your treatment, we encourage you to take your nausea medication as directed.  BELOW ARE SYMPTOMS THAT SHOULD BE REPORTED IMMEDIATELY: *FEVER GREATER THAN 100.4 F (38 C) OR HIGHER *CHILLS OR SWEATING *NAUSEA AND VOMITING THAT IS NOT CONTROLLED WITH YOUR NAUSEA MEDICATION *UNUSUAL SHORTNESS OF BREATH *UNUSUAL BRUISING OR BLEEDING *URINARY PROBLEMS (pain or burning when urinating, or frequent urination) *BOWEL PROBLEMS (unusual diarrhea, constipation, pain near the anus) TENDERNESS IN MOUTH AND THROAT WITH OR WITHOUT PRESENCE OF ULCERS (sore throat, sores in mouth, or a toothache) UNUSUAL RASH, SWELLING OR PAIN  UNUSUAL VAGINAL DISCHARGE OR ITCHING   Items with * indicate a potential emergency and should be followed up as soon as possible or go to the Emergency Department if any problems should occur.  Please show the CHEMOTHERAPY ALERT CARD or IMMUNOTHERAPY ALERT CARD at  check-in to the Emergency Department and triage nurse.  Should you have questions after your visit or need to cancel or reschedule your appointment, please contact [facility]  Dept: [phone]  and follow the prompts.  Office hours are 8:00 a.m. to 4:30 p.m. Monday - Friday. Please note that voicemails left after 4:00 p.m. may not be returned until the following business day.  We are closed weekends and major holidays. You have access to a nurse at all times for urgent questions. Please call the main number to the clinic Dept: [phone] and follow the prompts.   For any non-urgent questions, you may also contact your provider using MyChart. We now offer e-Visits for anyone 52 and older to request care online for non-urgent symptoms. For details visit [email or web address].   Also download the MyChart app! Go to the app store, search "MyChart", open the app, select [facility], and log in with your MyChart username and password.  Due to Covid, a mask is required upon entering the hospital/clinic. If you do not have a mask, one will be given to you upon arrival. For doctor visits, patients may have 1 support person aged 17 or older with them. For treatment visits, patients cannot have anyone with them due to current Covid guidelines and our immunocompromised population.   Bendamustine Injection What is this medication? BENDAMUSTINE (BEN da MUS teen) is a chemotherapy drug. It is used to treat chronic lymphocytic leukemia and non-Hodgkin lymphoma. This medicine may be used for other purposes; ask your health care provider or pharmacist if you have questions. COMMON BRAND NAME(S):  Neldon Mc What should I tell my care team before I take this medication? They need to know if you have any of these conditions: infection (especially a virus infection such as chickenpox, cold sores, or herpes) kidney disease liver disease an unusual or  allergic reaction to bendamustine, mannitol, other medicines, foods, dyes, or preservatives pregnant or trying to get pregnant breast-feeding How should I use this medication? This medicine is for infusion into a vein. It is given by a health care professional in a hospital or clinic setting. Talk to your pediatrician regarding the use of this medicine in children. Special care may be needed. Overdosage: If you think you have taken too much of this medicine contact a poison control center or emergency room at once. NOTE: This medicine is only for you. Do not share this medicine with others. What if I miss a dose? It is important not to miss your dose. Call your doctor or health care professional if you are unable to keep an appointment. What may interact with this medication? Do not take this medicine with any of the following medications: clozapine This medicine may also interact with the following medications: atazanavir cimetidine ciprofloxacin enoxacin fluvoxamine medicines for seizures like carbamazepine and phenobarbital mexiletine rifampin tacrine thiabendazole zileuton This list may not describe all possible interactions. Give your health care provider a list of all the medicines, herbs, non-prescription drugs, or dietary supplements you use. Also tell them if you smoke, drink alcohol, or use illegal drugs. Some items may interact with your medicine. What should I watch for while using this medication? This drug may make you feel generally unwell. This is not uncommon, as chemotherapy can affect healthy cells as well as cancer cells. Report any side effects. Continue your course of treatment even though you feel ill unless your doctor tells you to stop. You may need blood work done while you are taking this medicine. Call your doctor or healthcare provider for advice if you get a fever, chills or sore throat, or other symptoms of a cold or flu. Do not treat yourself. This drug  decreases your body's ability to fight infections. Try to avoid being around people who are sick. This medicine may cause serious skin reactions. They can happen weeks to months after starting the medicine. Contact your healthcare provider right away if you notice fevers or flu-like symptoms with a rash. The rash may be red or purple and then turn into blisters or peeling of the skin. Or, you might notice a red rash with swelling of the face, lips or lymph nodes in your neck or under your arms. In some patients, this medicine may cause a serious brain infection that may cause death. If you have any problems seeing, thinking, speaking, walking, or standing, tell your health care provider right away. If you cannot reach your health care provider, urgently seek other source of medical care. This medicine may increase your risk to bruise or bleed. Call your doctor or healthcare provider if you notice any unusual bleeding. Talk to your doctor about your risk of cancer. You may be more at risk for certain types of cancers if you take this medicine. This medicine may increase your risk of skin cancer. Check your skin for changes to moles or for new growths while taking this medicine. Call your health care provider if you notice any of these skin changes. Do not become pregnant while taking this medicine or for at least 6 months after stopping it.  Women should inform their doctor if they wish to become pregnant or think they might be pregnant. Men should not father a child while taking this medicine and for at least 3 months after stopping it. There is a potential for serious side effects to an unborn child. Talk to your healthcare provider or pharmacist for more information. Do not breast-feed an infant while taking this medicine or for at least 1 week after stopping it. This medicine may make it more difficult to father a child. You should talk with your doctor or healthcare provider if you are concerned about your  fertility. What side effects may I notice from receiving this medication? Side effects that you should report to your doctor or health care professional as soon as possible: allergic reactions like skin rash, itching or hives, swelling of the face, lips, or tongue low blood counts - this medicine may decrease the number of white blood cells, red blood cells and platelets. You may be at increased risk for infections and bleeding. rash, fever, and swollen lymph nodes redness, blistering, peeling, or loosening of the skin, including inside the mouth signs of infection like fever or chills, cough, sore throat, pain or difficulty passing urine signs of decreased platelets or bleeding like bruising, pinpoint red spots on the skin, black, tarry stools, blood in the urine signs of decreased red blood cells like being unusually weak or tired, fainting spells, lightheadedness signs and symptoms of kidney injury like trouble passing urine or change in the amount of urine signs and symptoms of liver injury like dark yellow or brown urine; general ill feeling or flu-like symptoms; light-colored stools; loss of appetite; nausea; right upper belly pain; unusually weak or tired; yellowing of the eyes or skin Side effects that usually do not require medical attention (report to your doctor or health care professional if they continue or are bothersome): constipation decreased appetite diarrhea headache mouth sores nausea, vomiting tiredness This list may not describe all possible side effects. Call your doctor for medical advice about side effects. You may report side effects to FDA at 1-800-FDA-1088. Where should I keep my medication? This drug is given in a hospital or clinic and will not be stored at home. NOTE: This sheet is a summary. It may not cover all possible information. If you have questions about this medicine, talk to your doctor, pharmacist, or health care provider.  2023 Elsevier/Gold  Standard (2019-10-10 00:00:00)

## 2021-10-27 NOTE — Progress Notes (Signed)
[name]   HEMATOLOGY/ONCOLOGY CLINIC VISIT NOTE  Date of Service: 10/27/2021   Patient Care Team: Sueanne Margarita, DO as PCP - General (Internal Medicine) Jodi Marble, MD as Consulting Physician (Otolaryngology) Armandina Gemma, MD as Consulting Physician (General Surgery) Kennith Center, RD as Dietitian (Family Medicine)  CHIEF COMPLAINTS/PURPOSE OF CONSULTATION: F/u prior to second cycle of Bendamustine Rituxan chemotherapy  HISTORY OF PRESENTING ILLNESS:  Please see previous note for details on initial presentation  INTERVAL HISTORY:  Dana Butler is a 66 y.o. female was seen in oncologic follow-up prior to C2 of Bendamustine Rituxan chemotherapy for symptomatic stage IV marginal zone lymphoma. She reports She is doing well with no new symptoms or concerns.  She notes recent pink eye infection and she took ciprofloxacin eye drops for this with resolution of symptoms.  She notes that she tolerated the Bendamustine Rituxan chemoimmunotherapy well overall with no nausea or vomiting no diarrhea.  No new lumps, bumps, or lesions/rashes. No overt hypersensitivity reactions. No other new or acute focal symptoms.  Labs done today were reviewed in detail.  MEDICAL HISTORY:  Past Medical History:  Diagnosis Date   Cancer ([place]) 2016   marginal zone non-hodgkins lymphoma   Medical history non-contributory     SURGICAL HISTORY: Past Surgical History:  Procedure Laterality Date   ABDOMINAL HYSTERECTOMY     AXILLARY LYMPH NODE BIOPSY Left 06/29/2014   Procedure: AXILLARY LYMPH NODE BIOPSY;  Surgeon: Armandina Gemma, MD;  Location: [facility];  Service: General;  Laterality: Left;   COLONOSCOPY     DIAGNOSTIC LAPAROSCOPY  2005   BSO   MASS EXCISION N/A 02/23/2019   Procedure: Excision of umbilicus skin lesion;  Surgeon: Wallace Going, DO;  Location: [facility];  Service: Plastics;  Laterality: N/A;    SOCIAL HISTORY: Social History   Socioeconomic  History   Marital status: Married    Spouse name: Not on file   Number of children: Not on file   Years of education: Not on file   Highest education level: Not on file  Occupational History   Not on file  Tobacco Use   Smoking status: Never   Smokeless tobacco: Never  Substance and Sexual Activity   Alcohol use: No   Drug use: No   Sexual activity: Not on file  Other Topics Concern   Not on file  Social History Narrative   Not on file   Social Determinants of Health   Financial Resource Strain: Not on file  Food Insecurity: Not on file  Transportation Needs: Not on file  Physical Activity: Not on file  Stress: Not on file  Social Connections: Not on file  Intimate Partner Violence: Not on file    FAMILY HISTORY: Family History  Problem Relation Age of Onset   Breast cancer Paternal Aunt     ALLERGIES:  is allergic to chloraprep one step [chlorhexidine gluconate], latex, and tape.  MEDICATIONS:  Current Outpatient Medications  Medication Sig Dispense Refill   acyclovir (ZOVIRAX) 400 MG tablet Take 1 tablet (400 mg total) by mouth daily. 30 tablet 3   ALPRAZolam (XANAX) 0.5 MG tablet      butalbital-acetaminophen-caffeine (FIORICET) 50-325-40 MG tablet Take 1-2 tablets by mouth every 6 (six) hours as needed for headache. 20 tablet 0   calcium carbonate (OS-CAL) 1250 (500 Ca) MG chewable tablet Chew by mouth.     Cholecalciferol (VITAMIN D3) 5000 UNITS TABS Take by mouth.     dexamethasone (DECADRON) 4  MG tablet Take 2 tablets (8 mg total) by mouth daily. Take 2 tablets daily. Start the day after bendamustine chemotherapy for 2 days. Take with food. 60 tablet 1   estradiol (VIVELLE-DOT) 0.05 MG/24HR patch Place 1 patch onto the skin 2 (two) times a week.     ferrous sulfate 325 (65 FE) MG EC tablet Take 1 tablet (325 mg total) by mouth daily with breakfast.  3   glucosamine-chondroitin 500-400 MG tablet Take by mouth.     ibuprofen (ADVIL,MOTRIN) 200 MG tablet Take  200 mg by mouth.     Multiple Vitamins-Minerals (MULTIVITAMIN WITH MINERALS) tablet Take 1 tablet by mouth daily.     OMEGA-3 FATTY ACIDS-VITAMIN E PO 1,000 capsules. Take 2 capsules by mouth daily.     ondansetron (ZOFRAN) 8 MG tablet Take 1 tablet (8 mg total) by mouth 2 (two) times daily as needed for nausea or vomiting (2 times daily PRN for refractory nausea / vomiting). Start on day 2 after bendamustine chemo. 20 tablet 1   prochlorperazine (COMPAZINE) 10 MG tablet Take 1 tablet (10 mg total) by mouth every 6 (six) hours as needed for nausea or vomiting. 30 tablet 1   riTUXimab (RITUXAN) 100 MG/10ML injection See admin instructions.     zaleplon (SONATA) 10 MG capsule Take 10 mg by mouth at bedtime.     No current facility-administered medications for this visit.   Facility-Administered Medications Ordered in Other Visits  Medication Dose Route Frequency Provider Last Rate Last Admin   acetaminophen (TYLENOL) tablet 650 mg  650 mg Oral Once Brunetta Genera, MD       bendamustine [facility]) 100 mg in sodium chloride 0.9 % 50 mL (1.8519 mg/mL) chemo infusion  70 mg/m2 (Order-Specific) Intravenous Once Brunetta Genera, MD       dexamethasone (DECADRON) 10 mg in sodium chloride 0.9 % 50 mL IVPB  10 mg Intravenous Once Brunetta Genera, MD       diphenhydrAMINE (BENADRYL) capsule 50 mg  50 mg Oral Once Brunetta Genera, MD       palonosetron (ALOXI) injection 0.25 mg  0.25 mg Intravenous Once Brunetta Genera, MD       riTUXimab-pvvr (RUXIENCE) 600 mg in sodium chloride 0.9 % 190 mL infusion  375 mg/m2 (Order-Specific) Intravenous Once Brunetta Genera, MD        REVIEW OF SYSTEMS:   10 Point review of Systems was done is negative except as noted above.  PHYSICAL EXAMINATION:. Wt Readings from Last 3 Encounters:  10/27/21 127 lb 4 oz (57.7 kg)  10/10/21 124 lb 12.8 oz (56.6 kg)  09/25/21 124 lb 12.8 oz (56.6 kg)  There were no vitals filed for this  visit.  NAD GENERAL:alert, in no acute distress and comfortable SKIN: no acute rashes, no significant lesions EYES: conjunctiva are pink and non-injected, sclera anicteric NECK: supple, no JVD LYMPH:  no palpable lymphadenopathy in the cervical, axillary or inguinal regions LUNGS: clear to auscultation b/l with normal respiratory effort HEART: regular rate & rhythm ABDOMEN:  normoactive bowel sounds , non tender, not distended. Extremity: no pedal edema PSYCH: alert & oriented x 3 with fluent speech NEURO: no focal motor/sensory deficits  Exam performed in chair.  LABORATORY DATA:  I have reviewed the data as listed  .    Latest Ref Rng & Units 10/27/2021   10:11 AM 10/10/2021    1:14 PM 09/25/2021    8:04 AM  CBC  WBC 4.0 -  10.5 K/uL 5.2  3.0  6.8   Hemoglobin 12.0 - 15.0 g/dL 12.1  11.7  12.7   Hematocrit 36.0 - 46.0 % 35.2  34.3  37.0   Platelets 150 - 400 K/uL 255  219  247   ANC 1700  .    Latest Ref Rng & Units 10/27/2021   10:11 AM 10/10/2021    1:14 PM 09/25/2021    8:04 AM  CMP  Glucose 70 - 99 mg/dL 93  81  124   BUN 8 - 23 mg/dL '13  14  16   '$ Creatinine 0.44 - 1.00 mg/dL 0.67  0.71  0.70   Sodium 135 - 145 mmol/L 138  138  138   Potassium 3.5 - 5.1 mmol/L 4.1  4.1  3.9   Chloride 98 - 111 mmol/L 106  105  107   CO2 22 - 32 mmol/L '28  29  26   '$ Calcium 8.9 - 10.3 mg/dL 9.0  9.3  9.4   Total Protein 6.5 - 8.1 g/dL 6.2  5.9  5.8   Total Bilirubin 0.3 - 1.2 mg/dL 0.3  0.4  0.3   Alkaline Phos 38 - 126 U/L 173  146  164   AST 15 - 41 U/L '27  28  23   '$ ALT 0 - 44 U/L '20  27  14     '$ Lab Results  Component Value Date   LDH 128 10/10/2021     RADIOGRAPHIC STUDIES: I have personally reviewed the radiological images as listed and agreed with the findings in the report. No results found.  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 Initially diagnosed in 2016. Patient was diagnosed with nodal marginal  zone lymphoma based on lymph node biopsy of her left axillary lymph node on 06/29/2014 and has been following with Dr. Jana Hakim for active surveillance and until recently did not meet criteria for consideration of treatment. In December 2019 on 04/12/2018 the patient had a biopsy of a right breast mass that showed pathology consistent with marginal zone lymphoma.  Patient over the last year or so has had increasing WBC counts which were as high as 91.2k in December 2022.  Patient also noted to have some anemia with hemoglobin of 11 and normal platelet counts of 234k in January 2023.  She also started developing some lymphadenopathy in the neck.  Patient is status post Rituxan every 3 weeks x 4 doses and recently was started on maintenance Rituxan  PET CT scan done 09/08/2021 revealed "1. Bulky axillary adenopathy periaortic adenopathy and iliac adenopathy with very low metabolic activity ( [place] 2). Findings consistent with low-grade lymphoma. 2. Normal spleen. 3. Diffuse permeative pattern within the pelvis and spine. No hypermetabolic skeletal lesions."  #2 Extensive bone involvement from marginal zone lymphoma #3 headaches without any red flags likely migrainous versus tension headaches.  Plan -Patient is here for followup prior to second cycle of Bendamustine Rituxan chemotherapy. She reports she is tolerating treatment well with no prohibitive toxicities. -Taking her posttreatment steroids for 2 to 3 days might help prevent her headaches. She is advised to continue to do this if she feels this helps. -She does not have any focal neurological deficits or any vision changes or any other red signs and her headaches have currently resolved and so we will hold off on additional imaging. -Use Fioricet as needed for uncontrolled headaches. -Labs done today were reviewed in detail.  CBC shows improved Hgb at 12.1, WBC count of 5.2k with an ANC of 3200 and normal platelets at 255k CMP  stable LDH at 149. We shall plan to continue Bendamustine Rituxan with reduced dose Bendamustine at 70 mg per metered square. Plan to pursue at least 4 cycles of treatment if tolerated to try to get deeper response after which we will plan to pursue maintenance Rituxan for 2 years. Plan for PET/CT after completion of 4 cycles of Bendamustine/Rituxan.  Follow-up Follow-up for cycle 3 of Bendamustine Rituxan as currently scheduled in 4 weeks. Please schedule cycle 4 of Bendamustine Rituxan as per orders with labs and MD visit with cycle 4-day 1 of treatment  .The total time spent in the appointment was 30 minutes* .  All of the patient's questions were answered with apparent satisfaction. The patient knows to call the clinic with any problems, questions or concerns.   Sullivan Lone MD MS AAHIVMS [facility] Hematology/Oncology Physician [facility]  .*Total Encounter Time as defined by the Centers for Medicare and Medicaid Services includes, in addition to the face-to-face time of a patient visit (documented in the note above) non-face-to-face time: obtaining and reviewing outside history, ordering and reviewing medications, tests or procedures, care coordination (communications with other health care professionals or caregivers) and documentation in the medical record.  I, Melene Muller, am acting as scribe for Dr. Sullivan Lone, MD.  .I have reviewed the above documentation for accuracy and completeness, and I agree with the above. Brunetta Genera MD

## 2021-10-29 ENCOUNTER — Other Ambulatory Visit: Payer: Self-pay

## 2021-10-29 ENCOUNTER — Inpatient Hospital Stay: Payer: Medicare Other

## 2021-10-29 VITALS — BP 108/59 | HR 65 | Temp 97.8°F

## 2021-10-29 DIAGNOSIS — C8580 Other specified types of non-Hodgkin lymphoma, unspecified site: Secondary | ICD-10-CM

## 2021-10-29 DIAGNOSIS — Z5112 Encounter for antineoplastic immunotherapy: Secondary | ICD-10-CM | POA: Diagnosis not present

## 2021-10-29 MED ORDER — SODIUM CHLORIDE 0.9 % IV SOLN: Freq: Once | INTRAVENOUS | Status: AC

## 2021-10-29 MED ORDER — SODIUM CHLORIDE 0.9 % IV SOLN
10.0000 mg | Freq: Once | INTRAVENOUS | Status: AC
  Administered 2021-10-29: 10 mg via INTRAVENOUS
  Filled 2021-10-29: qty 10

## 2021-10-29 MED ORDER — SODIUM CHLORIDE 0.9 % IV SOLN
70.0000 mg/m2 | Freq: Once | INTRAVENOUS | Status: AC
  Administered 2021-10-29: 100 mg via INTRAVENOUS
  Filled 2021-10-29: qty 4

## 2021-10-29 MED ORDER — PALONOSETRON HCL INJECTION 0.25 MG/5ML
0.2500 mg | Freq: Once | INTRAVENOUS | Status: AC
  Administered 2021-10-29: 0.25 mg via INTRAVENOUS
  Filled 2021-10-29: qty 5

## 2021-10-29 NOTE — Patient Instructions (Signed)
[facility] ONCOLOGY  Discharge Instructions: Thank you for choosing [facility] to provide your oncology and hematology care.   If you have a lab appointment with the [facility], please go directly to the [facility] and check in at the registration area.   Wear comfortable clothing and clothing appropriate for easy access to any Portacath or PICC line.   We strive to give you quality time with your provider. You may need to reschedule your appointment if you arrive late (15 or more minutes).  Arriving late affects you and other patients whose appointments are after yours.  Also, if you miss three or more appointments without notifying the office, you may be dismissed from the clinic at the provider's discretion.      For prescription refill requests, have your pharmacy contact our office and allow 72 hours for refills to be completed.    Today you received the following chemotherapy and/or immunotherapy agents :  Bendeka      To help prevent nausea and vomiting after your treatment, we encourage you to take your nausea medication as directed.  BELOW ARE SYMPTOMS THAT SHOULD BE REPORTED IMMEDIATELY: *FEVER GREATER THAN 100.4 F (38 C) OR HIGHER *CHILLS OR SWEATING *NAUSEA AND VOMITING THAT IS NOT CONTROLLED WITH YOUR NAUSEA MEDICATION *UNUSUAL SHORTNESS OF BREATH *UNUSUAL BRUISING OR BLEEDING *URINARY PROBLEMS (pain or burning when urinating, or frequent urination) *BOWEL PROBLEMS (unusual diarrhea, constipation, pain near the anus) TENDERNESS IN MOUTH AND THROAT WITH OR WITHOUT PRESENCE OF ULCERS (sore throat, sores in mouth, or a toothache) UNUSUAL RASH, SWELLING OR PAIN  UNUSUAL VAGINAL DISCHARGE OR ITCHING   Items with * indicate a potential emergency and should be followed up as soon as possible or go to the Emergency Department if any problems should occur.  Please show the CHEMOTHERAPY ALERT CARD or IMMUNOTHERAPY ALERT CARD at check-in to  the Emergency Department and triage nurse.  Should you have questions after your visit or need to cancel or reschedule your appointment, please contact [facility]  Dept: [phone]  and follow the prompts.  Office hours are 8:00 a.m. to 4:30 p.m. Monday - Friday. Please note that voicemails left after 4:00 p.m. may not be returned until the following business day.  We are closed weekends and major holidays. You have access to a nurse at all times for urgent questions. Please call the main number to the clinic Dept: [phone] and follow the prompts.   For any non-urgent questions, you may also contact your provider using MyChart. We now offer e-Visits for anyone 8 and older to request care online for non-urgent symptoms. For details visit [email or web address].   Also download the MyChart app! Go to the app store, search "MyChart", open the app, select [facility], and log in with your MyChart username and password.  Masks are optional in the cancer centers. If you would like for your care team to wear a mask while they are taking care of you, please let them know. For doctor visits, patients may have with them one support person who is at least 66 years old. At this time, visitors are not allowed in the infusion area.

## 2021-10-30 ENCOUNTER — Telehealth: Payer: Self-pay | Admitting: Hematology

## 2021-10-30 NOTE — Telephone Encounter (Signed)
Scheduled follow-up appointments per 7/3 los. Patient is aware.

## 2021-11-02 ENCOUNTER — Encounter: Payer: Self-pay | Admitting: Hematology

## 2021-11-17 ENCOUNTER — Other Ambulatory Visit: Payer: Self-pay

## 2021-11-24 ENCOUNTER — Inpatient Hospital Stay (HOSPITAL_BASED_OUTPATIENT_CLINIC_OR_DEPARTMENT_OTHER): Payer: Medicare Other | Admitting: Hematology

## 2021-11-24 ENCOUNTER — Inpatient Hospital Stay: Payer: Medicare Other

## 2021-11-24 ENCOUNTER — Other Ambulatory Visit: Payer: Self-pay

## 2021-11-24 VITALS — BP 122/61 | HR 77 | Temp 98.0°F | Resp 16 | Ht 62.0 in | Wt 121.2 lb

## 2021-11-24 VITALS — BP 119/61 | HR 62 | Temp 97.8°F | Resp 17

## 2021-11-24 DIAGNOSIS — C8580 Other specified types of non-Hodgkin lymphoma, unspecified site: Secondary | ICD-10-CM

## 2021-11-24 DIAGNOSIS — Z5111 Encounter for antineoplastic chemotherapy: Secondary | ICD-10-CM | POA: Diagnosis not present

## 2021-11-24 DIAGNOSIS — Z5112 Encounter for antineoplastic immunotherapy: Secondary | ICD-10-CM | POA: Diagnosis not present

## 2021-11-24 LAB — CBC WITH DIFFERENTIAL (CANCER CENTER ONLY)
Abs Immature Granulocytes: 0.01 10*3/uL (ref 0.00–0.07)
Basophils Absolute: 0.1 10*3/uL (ref 0.0–0.1)
Basophils Relative: 2 %
Eosinophils Absolute: 0.2 10*3/uL (ref 0.0–0.5)
Eosinophils Relative: 8 %
HCT: 34.3 % — ABNORMAL LOW (ref 36.0–46.0)
Hemoglobin: 11.9 g/dL — ABNORMAL LOW (ref 12.0–15.0)
Immature Granulocytes: 0 %
Lymphocytes Relative: 11 %
Lymphs Abs: 0.3 10*3/uL — ABNORMAL LOW (ref 0.7–4.0)
MCH: 31.1 pg (ref 26.0–34.0)
MCHC: 34.7 g/dL (ref 30.0–36.0)
MCV: 89.6 fL (ref 80.0–100.0)
Monocytes Absolute: 0.3 10*3/uL (ref 0.1–1.0)
Monocytes Relative: 8 %
Neutro Abs: 2.2 10*3/uL (ref 1.7–7.7)
Neutrophils Relative %: 71 %
Platelet Count: 259 10*3/uL (ref 150–400)
RBC: 3.83 MIL/uL — ABNORMAL LOW (ref 3.87–5.11)
RDW: 15.9 % — ABNORMAL HIGH (ref 11.5–15.5)
WBC Count: 3.1 10*3/uL — ABNORMAL LOW (ref 4.0–10.5)
nRBC: 0 % (ref 0.0–0.2)

## 2021-11-24 LAB — CMP (CANCER CENTER ONLY)
ALT: 21 U/L (ref 0–44)
AST: 29 U/L (ref 15–41)
Albumin: 3.9 g/dL (ref 3.5–5.0)
Alkaline Phosphatase: 118 U/L (ref 38–126)
Anion gap: 5 (ref 5–15)
BUN: 6 mg/dL — ABNORMAL LOW (ref 8–23)
CO2: 28 mmol/L (ref 22–32)
Calcium: 9.2 mg/dL (ref 8.9–10.3)
Chloride: 106 mmol/L (ref 98–111)
Creatinine: 0.68 mg/dL (ref 0.44–1.00)
GFR, Estimated: >60 mL/min (ref >60)
Glucose, Bld: 111 mg/dL — ABNORMAL HIGH (ref 70–99)
Potassium: 3.7 mmol/L (ref 3.5–5.1)
Sodium: 139 mmol/L (ref 135–145)
Total Bilirubin: 0.4 mg/dL (ref 0.3–1.2)
Total Protein: 6.2 g/dL — ABNORMAL LOW (ref 6.5–8.1)

## 2021-11-24 LAB — LACTATE DEHYDROGENASE: LDH: 152 U/L (ref 98–192)

## 2021-11-24 MED ORDER — ACETAMINOPHEN 325 MG PO TABS
650.0000 mg | ORAL_TABLET | Freq: Once | ORAL | Status: AC
  Administered 2021-11-24: 650 mg via ORAL
  Filled 2021-11-24: qty 2

## 2021-11-24 MED ORDER — SODIUM CHLORIDE 0.9 % IV SOLN: Freq: Once | INTRAVENOUS | Status: AC

## 2021-11-24 MED ORDER — SODIUM CHLORIDE 0.9 % IV SOLN
70.0000 mg/m2 | Freq: Once | INTRAVENOUS | Status: AC
  Administered 2021-11-24: 100 mg via INTRAVENOUS
  Filled 2021-11-24: qty 4

## 2021-11-24 MED ORDER — SODIUM CHLORIDE 0.9 % IV SOLN
10.0000 mg | Freq: Once | INTRAVENOUS | Status: AC
  Administered 2021-11-24: 10 mg via INTRAVENOUS
  Filled 2021-11-24: qty 10

## 2021-11-24 MED ORDER — PALONOSETRON HCL INJECTION 0.25 MG/5ML
0.2500 mg | Freq: Once | INTRAVENOUS | Status: AC
  Administered 2021-11-24: 0.25 mg via INTRAVENOUS
  Filled 2021-11-24: qty 5

## 2021-11-24 MED ORDER — SODIUM CHLORIDE 0.9 % IV SOLN
375.0000 mg/m2 | Freq: Once | INTRAVENOUS | Status: AC
  Administered 2021-11-24: 600 mg via INTRAVENOUS
  Filled 2021-11-24: qty 50

## 2021-11-24 MED ORDER — DIPHENHYDRAMINE HCL 25 MG PO CAPS
50.0000 mg | ORAL_CAPSULE | Freq: Once | ORAL | Status: AC
  Administered 2021-11-24: 50 mg via ORAL
  Filled 2021-11-24: qty 2

## 2021-11-24 NOTE — Patient Instructions (Signed)
[facility] ONCOLOG[facility]  Discharge Instructions: Thank you for choosing [facility] to provide your oncology and hematology care.   If you have a lab appointment with the [facility], please go directly to the [facility] and check in at the registration area.   Wear comfortable clothing and clothing appropriate for easy access to any Portacath or PICC line.   We strive to give you quality time with your provider. You may need to reschedule your appointment if you arrive late (15 or more minutes).  Arriving late affects you and other patients whose appointments are after yours.  Also, if you miss three or more appointments without notifying the office, you may be dismissed from the clinic at the provider's discretion.      For prescription refill requests, have your pharmacy contact our office and allow 72 hours for refills to be completed.    Today you received the following chemotherapy and/or immunotherapy agents: Rituximab and Bendeka      To help prevent nausea and vomiting after your treatment, we encourage you to take your nausea medication as directed.  BELOW ARE SYMPTOMS THAT SHOULD BE REPORTED IMMEDIATELY: *FEVER GREATER THAN 100.4 F (38 C) OR HIGHER *CHILLS OR SWEATING *NAUSEA AND VOMITING THAT IS NOT CONTROLLED WITH YOUR NAUSEA MEDICATION *UNUSUAL SHORTNESS OF BREATH *UNUSUAL BRUISING OR BLEEDING *URINARY PROBLEMS (pain or burning when urinating, or frequent urination) *BOWEL PROBLEMS (unusual diarrhea, constipation, pain near the anus) TENDERNESS IN MOUTH AND THROAT WITH OR WITHOUT PRESENCE OF ULCERS (sore throat, sores in mouth, or a toothache) UNUSUAL RASH, SWELLING OR PAIN  UNUSUAL VAGINAL DISCHARGE OR ITCHING   Items with * indicate a potential emergency and should be followed up as soon as possible or go to the Emergency Department if any problems should occur.  Please show the CHEMOTHERAPY ALERT CARD or IMMUNOTHERAPY ALERT CARD at  check-in to the Emergency Department and triage nurse.  Should you have questions after your visit or need to cancel or reschedule your appointment, please contact [facility]  Dept: [phone]  and follow the prompts.  Office hours are 8:00 a.m. to 4:30 p.m. Monday - Friday. Please note that voicemails left after 4:00 p.m. may not be returned until the following business day.  We are closed weekends and major holidays. You have access to a nurse at all times for urgent questions. Please call the main number to the clinic Dept: [phone] and follow the prompts.   For any non-urgent questions, you may also contact your provider using MyChart. We now offer e-Visits for anyone 29 and older to request care online for non-urgent symptoms. For details visit [email or web address].   Also download the MyChart app! Go to the app store, search "MyChart", open the app, select [facility], and log in with your MyChart username and password.  Masks are optional in the cancer centers. If you would like for your care team to wear a mask while they are taking care of you, please let them know. For doctor visits, patients may have with them one support person who is at least 66 years old. At this time, visitors are not allowed in the infusion area.

## 2021-11-25 ENCOUNTER — Inpatient Hospital Stay: Payer: Medicare Other | Attending: Oncology

## 2021-11-25 ENCOUNTER — Other Ambulatory Visit: Payer: Self-pay

## 2021-11-25 VITALS — BP 118/67 | HR 69 | Temp 97.7°F | Resp 18

## 2021-11-25 DIAGNOSIS — Z803 Family history of malignant neoplasm of breast: Secondary | ICD-10-CM | POA: Diagnosis not present

## 2021-11-25 DIAGNOSIS — M8580 Other specified disorders of bone density and structure, unspecified site: Secondary | ICD-10-CM

## 2021-11-25 DIAGNOSIS — C8580 Other specified types of non-Hodgkin lymphoma, unspecified site: Secondary | ICD-10-CM | POA: Insufficient documentation

## 2021-11-25 DIAGNOSIS — Z5111 Encounter for antineoplastic chemotherapy: Secondary | ICD-10-CM | POA: Insufficient documentation

## 2021-11-25 DIAGNOSIS — D508 Other iron deficiency anemias: Secondary | ICD-10-CM

## 2021-11-25 MED ORDER — SODIUM CHLORIDE 0.9 % IV SOLN
70.0000 mg/m2 | Freq: Once | INTRAVENOUS | Status: AC
  Administered 2021-11-25: 100 mg via INTRAVENOUS
  Filled 2021-11-25: qty 4

## 2021-11-25 MED ORDER — SODIUM CHLORIDE 0.9 % IV SOLN: Freq: Once | INTRAVENOUS | Status: AC

## 2021-11-25 MED ORDER — SODIUM CHLORIDE 0.9 % IV SOLN
10.0000 mg | Freq: Once | INTRAVENOUS | Status: AC
  Administered 2021-11-25: 10 mg via INTRAVENOUS
  Filled 2021-11-25: qty 10

## 2021-11-25 NOTE — Patient Instructions (Signed)
[facility] ONCOLOGY  Discharge Instructions: Thank you for choosing [facility] to provide your oncology and hematology care.   If you have a lab appointment with the [facility], please go directly to the [facility] and check in at the registration area.   Wear comfortable clothing and clothing appropriate for easy access to any Portacath or PICC line.   We strive to give you quality time with your provider. You may need to reschedule your appointment if you arrive late (15 or more minutes).  Arriving late affects you and other patients whose appointments are after yours.  Also, if you miss three or more appointments without notifying the office, you may be dismissed from the clinic at the provider's discretion.      For prescription refill requests, have your pharmacy contact our office and allow 72 hours for refills to be completed.    Today you received the following chemotherapy and/or immunotherapy agents: Bendeka      To help prevent nausea and vomiting after your treatment, we encourage you to take your nausea medication as directed.  BELOW ARE SYMPTOMS THAT SHOULD BE REPORTED IMMEDIATELY: *FEVER GREATER THAN 100.4 F (38 C) OR HIGHER *CHILLS OR SWEATING *NAUSEA AND VOMITING THAT IS NOT CONTROLLED WITH YOUR NAUSEA MEDICATION *UNUSUAL SHORTNESS OF BREATH *UNUSUAL BRUISING OR BLEEDING *URINARY PROBLEMS (pain or burning when urinating, or frequent urination) *BOWEL PROBLEMS (unusual diarrhea, constipation, pain near the anus) TENDERNESS IN MOUTH AND THROAT WITH OR WITHOUT PRESENCE OF ULCERS (sore throat, sores in mouth, or a toothache) UNUSUAL RASH, SWELLING OR PAIN  UNUSUAL VAGINAL DISCHARGE OR ITCHING   Items with * indicate a potential emergency and should be followed up as soon as possible or go to the Emergency Department if any problems should occur.  Please show the CHEMOTHERAPY ALERT CARD or IMMUNOTHERAPY ALERT CARD at check-in to  the Emergency Department and triage nurse.  Should you have questions after your visit or need to cancel or reschedule your appointment, please contact [facility]  Dept: [phone]  and follow the prompts.  Office hours are 8:00 a.m. to 4:30 p.m. Monday - Friday. Please note that voicemails left after 4:00 p.m. may not be returned until the following business day.  We are closed weekends and major holidays. You have access to a nurse at all times for urgent questions. Please call the main number to the clinic Dept: [phone] and follow the prompts.   For any non-urgent questions, you may also contact your provider using MyChart. We now offer e-Visits for anyone 76 and older to request care online for non-urgent symptoms. For details visit [email or web address].   Also download the MyChart app! Go to the app store, search "MyChart", open the app, select [facility], and log in with your MyChart username and password.  Masks are optional in the cancer centers. If you would like for your care team to wear a mask while they are taking care of you, please let them know. For doctor visits, patients may have with them one support Jager Koska who is at least 66 years old. At this time, visitors are not allowed in the infusion area.

## 2021-11-27 ENCOUNTER — Other Ambulatory Visit: Payer: Self-pay

## 2021-11-27 DIAGNOSIS — C8580 Other specified types of non-Hodgkin lymphoma, unspecified site: Secondary | ICD-10-CM

## 2021-11-27 MED ORDER — ERGOCALCIFEROL 1.25 MG (50000 UT) PO CAPS: 50000.0000 [IU] | ORAL_CAPSULE | ORAL | 2 refills | Status: DC

## 2021-11-30 ENCOUNTER — Encounter: Payer: Self-pay | Admitting: Hematology

## 2021-11-30 NOTE — Progress Notes (Signed)
[name]   HEMATOLOGY/ONCOLOGY CLINIC VISIT NOTE  Date of Service: 11/24/2021    Patient Care Team: Sueanne Margarita, DO as PCP - General (Internal Medicine) Jodi Marble, MD as Consulting Physician (Otolaryngology) Armandina Gemma, MD as Consulting Physician (General Surgery) Kennith Center, RD as Dietitian (Family Medicine)  CHIEF COMPLAINTS/PURPOSE OF CONSULTATION: Follow-up prior to cycle 3 of Bendamustine Rituxan chemotherapy  HISTORY OF PRESENTING ILLNESS:  Please see previous note for details on initial presentation  INTERVAL HISTORY:  [name] is a 66 y.o. female here for follow-up prior to cycle 3 of Bendamustine Rituxan chemotherapy for stage IV marginal zone lymphoma. She notes that she tolerated her last cycle well with no acute toxicities other than grade 1-2 fatigue. Has not had as much issue with headaches. Reasonable p.o. intake. No fevers no chills no night sweats no unexpected weight loss. No new lumps or bumps. Labs done today were reviewed with the patient in detail.  MEDICAL HISTORY:  Past Medical History:  Diagnosis Date   Cancer ([place]) 2016   marginal zone non-hodgkins lymphoma   Medical history non-contributory     SURGICAL HISTORY: Past Surgical History:  Procedure Laterality Date   ABDOMINAL HYSTERECTOMY     AXILLARY LYMPH NODE BIOPSY Left 06/29/2014   Procedure: AXILLARY LYMPH NODE BIOPSY;  Surgeon: Armandina Gemma, MD;  Location: [facility];  Service: General;  Laterality: Left;   COLONOSCOPY     DIAGNOSTIC LAPAROSCOPY  2005   BSO   MASS EXCISION N/A 02/23/2019   Procedure: Excision of umbilicus skin lesion;  Surgeon: Wallace Going, DO;  Location: [facility];  Service: Plastics;  Laterality: N/A;    SOCIAL HISTORY: Social History   Socioeconomic History   Marital status: Married    Spouse name: Not on file   Number of children: Not on file   Years of education: Not on file   Highest education level: Not on  file  Occupational History   Not on file  Tobacco Use   Smoking status: Never   Smokeless tobacco: Never  Substance and Sexual Activity   Alcohol use: No   Drug use: No   Sexual activity: Not on file  Other Topics Concern   Not on file  Social History Narrative   Not on file   Social Determinants of Health   Financial Resource Strain: Not on file  Food Insecurity: Not on file  Transportation Needs: Not on file  Physical Activity: Not on file  Stress: Not on file  Social Connections: Not on file  Intimate Partner Violence: Not on file    FAMILY HISTORY: Family History  Problem Relation Age of Onset   Breast cancer Paternal Aunt     ALLERGIES:  is allergic to chloraprep one step [chlorhexidine gluconate], latex, and tape.  MEDICATIONS:  Current Outpatient Medications  Medication Sig Dispense Refill   acyclovir (ZOVIRAX) 400 MG tablet Take 1 tablet (400 mg total) by mouth daily. 30 tablet 3   ALPRAZolam (XANAX) 0.5 MG tablet      butalbital-acetaminophen-caffeine (FIORICET) 50-325-40 MG tablet Take 1-2 tablets by mouth every 6 (six) hours as needed for headache. 20 tablet 0   calcium carbonate (OS-CAL) 1250 (500 Ca) MG chewable tablet Chew by mouth.     Cholecalciferol (VITAMIN D3) 5000 UNITS TABS Take by mouth.     dexamethasone (DECADRON) 4 MG tablet Take 2 tablets (8 mg total) by mouth daily. Take 2 tablets daily. Start the day after bendamustine chemotherapy for  2 days. Take with food. 60 tablet 1   estradiol (VIVELLE-DOT) 0.05 MG/24HR patch Place 1 patch onto the skin 2 (two) times a week.     ferrous sulfate 325 (65 FE) MG EC tablet Take 1 tablet (325 mg total) by mouth daily with breakfast.  3   glucosamine-chondroitin 500-400 MG tablet Take by mouth.     Multiple Vitamins-Minerals (MULTIVITAMIN WITH MINERALS) tablet Take 1 tablet by mouth daily.     OMEGA-3 FATTY ACIDS-VITAMIN E PO 1,000 capsules. Take 2 capsules by mouth daily.     ondansetron (ZOFRAN) 8 MG  tablet Take 1 tablet (8 mg total) by mouth 2 (two) times daily as needed for nausea or vomiting (2 times daily PRN for refractory nausea / vomiting). Start on day 2 after bendamustine chemo. 20 tablet 1   prochlorperazine (COMPAZINE) 10 MG tablet Take 1 tablet (10 mg total) by mouth every 6 (six) hours as needed for nausea or vomiting. 30 tablet 1   riTUXimab (RITUXAN) 100 MG/10ML injection See admin instructions.     zaleplon (SONATA) 10 MG capsule Take 10 mg by mouth at bedtime.     ergocalciferol (VITAMIN D2) 1.25 MG (50000 UT) capsule Take 1 capsule (50,000 Units total) by mouth once a week. 8 capsule 2   ibuprofen (ADVIL,MOTRIN) 200 MG tablet Take 200 mg by mouth.     No current facility-administered medications for this visit.    REVIEW OF SYSTEMS:   10 Point review of Systems was done is negative except as noted above.  PHYSICAL EXAMINATION:. Wt Readings from Last 3 Encounters:  11/24/21 121 lb 3.2 oz (55 kg)  10/27/21 127 lb 4 oz (57.7 kg)  10/10/21 124 lb 12.8 oz (56.6 kg)   Vitals:   11/24/21 1024  BP: 122/61  Pulse: 77  Resp: 16  Temp: 98 F (36.7 C)  SpO2: (!) 2%   NAD GENERAL:alert, in no acute distress and comfortable SKIN: no acute rashes, no significant lesions EYES: conjunctiva are pink and non-injected, sclera anicteric OROPHARYNX: MMM, no exudates, no oropharyngeal erythema or ulceration NECK: supple, no JVD LYMPH:  no palpable lymphadenopathy in the cervical, axillary or inguinal regions LUNGS: clear to auscultation b/l with normal respiratory effort HEART: regular rate & rhythm ABDOMEN:  normoactive bowel sounds , non tender, not distended. Extremity: no pedal edema PSYCH: alert & oriented x 3 with fluent speech NEURO: no focal motor/sensory deficits  LABORATORY DATA:  I have reviewed the data as listed  .    Latest Ref Rng & Units 11/24/2021   10:12 AM 10/27/2021   10:11 AM 10/10/2021    1:14 PM  CBC  WBC 4.0 - 10.5 K/uL 3.1  5.2  3.0    Hemoglobin 12.0 - 15.0 g/dL 11.9  12.1  11.7   Hematocrit 36.0 - 46.0 % 34.3  35.2  34.3   Platelets 150 - 400 K/uL 259  255  219   ANC 1700  .    Latest Ref Rng & Units 11/24/2021   10:12 AM 10/27/2021   10:11 AM 10/10/2021    1:14 PM  CMP  Glucose 70 - 99 mg/dL 111  93  81   BUN 8 - 23 mg/dL '6  13  14   '$ Creatinine 0.44 - 1.00 mg/dL 0.68  0.67  0.71   Sodium 135 - 145 mmol/L 139  138  138   Potassium 3.5 - 5.1 mmol/L 3.7  4.1  4.1   Chloride 98 - 111 mmol/L  106  106  105   CO2 22 - 32 mmol/L '28  28  29   '$ Calcium 8.9 - 10.3 mg/dL 9.2  9.0  9.3   Total Protein 6.5 - 8.1 g/dL 6.2  6.2  5.9   Total Bilirubin 0.3 - 1.2 mg/dL 0.4  0.3  0.4   Alkaline Phos 38 - 126 U/L 118  173  146   AST 15 - 41 U/L '29  27  28   '$ ALT 0 - 44 U/L '21  20  27     '$ Lab Results  Component Value Date   LDH 152 11/24/2021     RADIOGRAPHIC STUDIES: I have personally reviewed the radiological images as listed and agreed with the findings in the report. No results found.  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 Initially diagnosed in 2016. Patient was diagnosed with nodal marginal zone lymphoma based on lymph node biopsy of her left axillary lymph node on 06/29/2014 and has been following with Dr. Jana Hakim for active surveillance and until recently did not meet criteria for consideration of treatment. In December 2019 on 04/12/2018 the patient had a biopsy of a right breast mass that showed pathology consistent with marginal zone lymphoma.  Patient over the last year or so has had increasing WBC counts which were as high as 91.2k in December 2022.  Patient also noted to have some anemia with hemoglobin of 11 and normal platelet counts of 234k in January 2023.  She also started developing some lymphadenopathy in the neck.  Patient is status post Rituxan every 3 weeks x 4 doses and recently was started on maintenance Rituxan  PET CT scan done  09/08/2021 revealed "1. Bulky axillary adenopathy periaortic adenopathy and iliac adenopathy with very low metabolic activity ( [place] 2). Findings consistent with low-grade lymphoma. 2. Normal spleen. 3. Diffuse permeative pattern within the pelvis and spine. No hypermetabolic skeletal lesions."  #2 Extensive bone involvement from marginal zone lymphoma #3 headaches without any red flags likely migrainous versus tension headaches.  Now much better controlled.  Plan -Patient is here for follow-up prior to cycle 3 of Bendamustine Rituxan chemoimmunotherapy . She notes no acute toxicities since her last clinic visit and last treatment. -Notes some grade 1-2 fatigue which has resolved. -Labs done today were reviewed in detail with the patient and show stable blood counts and chemistry and normal LDH. -We shall continue to proceed with Bendamustine at 70 mg per metered square and Rituxan. -Patient is appropriate to proceed with cycle 3 of chemoimmunotherapy starting today. Chemoimmunotherapy orders were reviewed and signed Patient is aware of side effects to monitor for and call us.  Follow-up Follow-up as per scheduled appointments for cycle 4 of Bendamustine Rituxan  The total time spent in the appointment was 31 minutes*.  All of the patient's questions were answered with apparent satisfaction. The patient knows to call the clinic with any problems, questions or concerns.   Sullivan Lone MD MS AAHIVMS [facility] Hematology/Oncology Physician [facility]  .*Total Encounter Time as defined by the Centers for Medicare and Medicaid Services includes, in addition to the face-to-face time of a patient visit (documented in the note above) non-face-to-face time: obtaining and reviewing outside history, ordering and reviewing medications, tests or procedures, care coordination (communications with other health care professionals or caregivers) and documentation in the medical  record.

## 2021-12-01 ENCOUNTER — Other Ambulatory Visit: Payer: Self-pay

## 2021-12-01 DIAGNOSIS — C8580 Other specified types of non-Hodgkin lymphoma, unspecified site: Secondary | ICD-10-CM

## 2021-12-01 MED ORDER — ACYCLOVIR 400 MG PO TABS: 400.0000 mg | ORAL_TABLET | Freq: Every day | ORAL | 3 refills | Status: AC

## 2021-12-07 ENCOUNTER — Other Ambulatory Visit: Payer: Self-pay

## 2021-12-22 ENCOUNTER — Other Ambulatory Visit: Payer: Self-pay

## 2021-12-22 ENCOUNTER — Inpatient Hospital Stay: Payer: Medicare Other

## 2021-12-22 ENCOUNTER — Inpatient Hospital Stay (HOSPITAL_BASED_OUTPATIENT_CLINIC_OR_DEPARTMENT_OTHER): Payer: Medicare Other | Admitting: Hematology

## 2021-12-22 VITALS — BP 144/66 | HR 63 | Temp 97.4°F | Resp 14 | Wt 124.6 lb

## 2021-12-22 VITALS — BP 137/67 | HR 62 | Temp 97.7°F | Resp 14

## 2021-12-22 DIAGNOSIS — Z5111 Encounter for antineoplastic chemotherapy: Secondary | ICD-10-CM | POA: Diagnosis not present

## 2021-12-22 DIAGNOSIS — C8580 Other specified types of non-Hodgkin lymphoma, unspecified site: Secondary | ICD-10-CM

## 2021-12-22 LAB — CBC WITH DIFFERENTIAL (CANCER CENTER ONLY)
Abs Immature Granulocytes: 0.01 10*3/uL (ref 0.00–0.07)
Basophils Absolute: 0.1 10*3/uL (ref 0.0–0.1)
Basophils Relative: 2 %
Eosinophils Absolute: 0.4 10*3/uL (ref 0.0–0.5)
Eosinophils Relative: 11 %
HCT: 34.9 % — ABNORMAL LOW (ref 36.0–46.0)
Hemoglobin: 12.2 g/dL (ref 12.0–15.0)
Immature Granulocytes: 0 %
Lymphocytes Relative: 9 %
Lymphs Abs: 0.3 10*3/uL — ABNORMAL LOW (ref 0.7–4.0)
MCH: 32.2 pg (ref 26.0–34.0)
MCHC: 35 g/dL (ref 30.0–36.0)
MCV: 92.1 fL (ref 80.0–100.0)
Monocytes Absolute: 0.3 10*3/uL (ref 0.1–1.0)
Monocytes Relative: 9 %
Neutro Abs: 2.6 10*3/uL (ref 1.7–7.7)
Neutrophils Relative %: 69 %
Platelet Count: 198 10*3/uL (ref 150–400)
RBC: 3.79 MIL/uL — ABNORMAL LOW (ref 3.87–5.11)
RDW: 16.1 % — ABNORMAL HIGH (ref 11.5–15.5)
WBC Count: 3.7 10*3/uL — ABNORMAL LOW (ref 4.0–10.5)
nRBC: 0 % (ref 0.0–0.2)

## 2021-12-22 LAB — CMP (CANCER CENTER ONLY)
ALT: 17 U/L (ref 0–44)
AST: 23 U/L (ref 15–41)
Albumin: 3.9 g/dL (ref 3.5–5.0)
Alkaline Phosphatase: 136 U/L — ABNORMAL HIGH (ref 38–126)
Anion gap: 3 — ABNORMAL LOW (ref 5–15)
BUN: 8 mg/dL (ref 8–23)
CO2: 28 mmol/L (ref 22–32)
Calcium: 9.5 mg/dL (ref 8.9–10.3)
Chloride: 109 mmol/L (ref 98–111)
Creatinine: 0.51 mg/dL (ref 0.44–1.00)
GFR, Estimated: >60 mL/min (ref >60)
Glucose, Bld: 97 mg/dL (ref 70–99)
Potassium: 3.9 mmol/L (ref 3.5–5.1)
Sodium: 140 mmol/L (ref 135–145)
Total Bilirubin: 0.3 mg/dL (ref 0.3–1.2)
Total Protein: 6 g/dL — ABNORMAL LOW (ref 6.5–8.1)

## 2021-12-22 LAB — LACTATE DEHYDROGENASE: LDH: 130 U/L (ref 98–192)

## 2021-12-22 MED ORDER — ACETAMINOPHEN 325 MG PO TABS
650.0000 mg | ORAL_TABLET | Freq: Once | ORAL | Status: AC
  Administered 2021-12-22: 650 mg via ORAL
  Filled 2021-12-22: qty 2

## 2021-12-22 MED ORDER — SODIUM CHLORIDE 0.9 % IV SOLN: Freq: Once | INTRAVENOUS | Status: AC

## 2021-12-22 MED ORDER — SODIUM CHLORIDE 0.9 % IV SOLN
10.0000 mg | Freq: Once | INTRAVENOUS | Status: AC
  Administered 2021-12-22: 10 mg via INTRAVENOUS
  Filled 2021-12-22: qty 10

## 2021-12-22 MED ORDER — SODIUM CHLORIDE 0.9 % IV SOLN
70.0000 mg/m2 | Freq: Once | INTRAVENOUS | Status: AC
  Administered 2021-12-22: 100 mg via INTRAVENOUS
  Filled 2021-12-22: qty 4

## 2021-12-22 MED ORDER — SODIUM CHLORIDE 0.9 % IV SOLN
375.0000 mg/m2 | Freq: Once | INTRAVENOUS | Status: AC
  Administered 2021-12-22: 600 mg via INTRAVENOUS
  Filled 2021-12-22: qty 50

## 2021-12-22 MED ORDER — DIPHENHYDRAMINE HCL 25 MG PO CAPS
50.0000 mg | ORAL_CAPSULE | Freq: Once | ORAL | Status: AC
  Administered 2021-12-22: 50 mg via ORAL
  Filled 2021-12-22: qty 2

## 2021-12-22 MED ORDER — PALONOSETRON HCL INJECTION 0.25 MG/5ML
0.2500 mg | Freq: Once | INTRAVENOUS | Status: AC
  Administered 2021-12-22: 0.25 mg via INTRAVENOUS
  Filled 2021-12-22: qty 5

## 2021-12-22 MED ORDER — ERYTHROMYCIN 5 MG/GM OP OINT: 1.0000 | TOPICAL_OINTMENT | Freq: Every day | OPHTHALMIC | 0 refills | Status: AC

## 2021-12-22 NOTE — Patient Instructions (Addendum)
[facility] ONCOLOGY  Discharge Instructions: Thank you for choosing [facility] to provide your oncology and hematology care.   If you have a lab appointment with the [facility], please go directly to the [facility] and check in at the registration area.   Wear comfortable clothing and clothing appropriate for easy access to any Portacath or PICC line.   We strive to give you quality time with your provider. You may need to reschedule your appointment if you arrive late (15 or more minutes).  Arriving late affects you and other patients whose appointments are after yours.  Also, if you miss three or more appointments without notifying the office, you may be dismissed from the clinic at the provider's discretion.      For prescription refill requests, have your pharmacy contact our office and allow 72 hours for refills to be completed.    Today you received the following chemotherapy and/or immunotherapy agents: Rituximab and Bendeka      To help prevent nausea and vomiting after your treatment, we encourage you to take your nausea medication as directed.  BELOW ARE SYMPTOMS THAT SHOULD BE REPORTED IMMEDIATELY: *FEVER GREATER THAN 100.4 F (38 C) OR HIGHER *CHILLS OR SWEATING *NAUSEA AND VOMITING THAT IS NOT CONTROLLED WITH YOUR NAUSEA MEDICATION *UNUSUAL SHORTNESS OF BREATH *UNUSUAL BRUISING OR BLEEDING *URINARY PROBLEMS (pain or burning when urinating, or frequent urination) *BOWEL PROBLEMS (unusual diarrhea, constipation, pain near the anus) TENDERNESS IN MOUTH AND THROAT WITH OR WITHOUT PRESENCE OF ULCERS (sore throat, sores in mouth, or a toothache) UNUSUAL RASH, SWELLING OR PAIN  UNUSUAL VAGINAL DISCHARGE OR ITCHING   Items with * indicate a potential emergency and should be followed up as soon as possible or go to the Emergency Department if any problems should occur.  Please show the CHEMOTHERAPY ALERT CARD or IMMUNOTHERAPY ALERT CARD at  check-in to the Emergency Department and triage nurse.  Should you have questions after your visit or need to cancel or reschedule your appointment, please contact [facility]  Dept: [phone]  and follow the prompts.  Office hours are 8:00 a.m. to 4:30 p.m. Monday - Friday. Please note that voicemails left after 4:00 p.m. may not be returned until the following business day.  We are closed weekends and major holidays. You have access to a nurse at all times for urgent questions. Please call the main number to the clinic Dept: [phone] and follow the prompts.   For any non-urgent questions, you may also contact your provider using MyChart. We now offer e-Visits for anyone 34 and older to request care online for non-urgent symptoms. For details visit [email or web address].   Also download the MyChart app! Go to the app store, search "MyChart", open the app, select [facility], and log in with your MyChart username and password.  Masks are optional in the cancer centers. If you would like for your care team to wear a mask while they are taking care of you, please let them know. You may have one support person who is at least 66 years old accompany you for your appointments.

## 2021-12-23 ENCOUNTER — Inpatient Hospital Stay: Payer: Medicare Other

## 2021-12-23 VITALS — BP 135/72 | HR 82 | Temp 97.5°F | Resp 16

## 2021-12-23 DIAGNOSIS — Z5111 Encounter for antineoplastic chemotherapy: Secondary | ICD-10-CM | POA: Diagnosis not present

## 2021-12-23 DIAGNOSIS — C8580 Other specified types of non-Hodgkin lymphoma, unspecified site: Secondary | ICD-10-CM

## 2021-12-23 MED ORDER — SODIUM CHLORIDE 0.9 % IV SOLN
70.0000 mg/m2 | Freq: Once | INTRAVENOUS | Status: AC
  Administered 2021-12-23: 100 mg via INTRAVENOUS
  Filled 2021-12-23: qty 4

## 2021-12-23 MED ORDER — SODIUM CHLORIDE 0.9 % IV SOLN: Freq: Once | INTRAVENOUS | Status: AC

## 2021-12-23 MED ORDER — SODIUM CHLORIDE 0.9 % IV SOLN
10.0000 mg | Freq: Once | INTRAVENOUS | Status: AC
  Administered 2021-12-23: 10 mg via INTRAVENOUS
  Filled 2021-12-23: qty 10

## 2021-12-23 NOTE — Patient Instructions (Signed)
[facility] ONCOLOGY   Discharge Instructions: Thank you for choosing [facility] to provide your oncology and hematology care.   If you have a lab appointment with the [facility], please go directly to the [facility] and check in at the registration area.   Wear comfortable clothing and clothing appropriate for easy access to any Portacath or PICC line.   We strive to give you quality time with your provider. You may need to reschedule your appointment if you arrive late (15 or more minutes).  Arriving late affects you and other patients whose appointments are after yours.  Also, if you miss three or more appointments without notifying the office, you may be dismissed from the clinic at the provider's discretion.      For prescription refill requests, have your pharmacy contact our office and allow 72 hours for refills to be completed.    Today you received the following chemotherapy and/or immunotherapy agents: Bendamustine Verl D[place])       To help prevent nausea and vomiting after your treatment, we encourage you to take your nausea medication as directed.  BELOW ARE SYMPTOMS THAT SHOULD BE REPORTED IMMEDIATELY: *FEVER GREATER THAN 100.4 F (38 C) OR HIGHER *CHILLS OR SWEATING *NAUSEA AND VOMITING THAT IS NOT CONTROLLED WITH YOUR NAUSEA MEDICATION *UNUSUAL SHORTNESS OF BREATH *UNUSUAL BRUISING OR BLEEDING *URINARY PROBLEMS (pain or burning when urinating, or frequent urination) *BOWEL PROBLEMS (unusual diarrhea, constipation, pain near the anus) TENDERNESS IN MOUTH AND THROAT WITH OR WITHOUT PRESENCE OF ULCERS (sore throat, sores in mouth, or a toothache) UNUSUAL RASH, SWELLING OR PAIN  UNUSUAL VAGINAL DISCHARGE OR ITCHING   Items with * indicate a potential emergency and should be followed up as soon as possible or go to the Emergency Department if any problems should occur.  Please show the CHEMOTHERAPY ALERT CARD or IMMUNOTHERAPY ALERT CARD  at check-in to the Emergency Department and triage nurse.  Should you have questions after your visit or need to cancel or reschedule your appointment, please contact [facility]  Dept: [phone]  and follow the prompts.  Office hours are 8:00 a.m. to 4:30 p.m. Monday - Friday. Please note that voicemails left after 4:00 p.m. may not be returned until the following business day.  We are closed weekends and major holidays. You have access to a nurse at all times for urgent questions. Please call the main number to the clinic Dept: [phone] and follow the prompts.   For any non-urgent questions, you may also contact your provider using MyChart. We now offer e-Visits for anyone 15 and older to request care online for non-urgent symptoms. For details visit [email or web address].   Also download the MyChart app! Go to the app store, search "MyChart", open the app, select [facility], and log in with your MyChart username and password.  Masks are optional in the cancer centers. If you would like for your care team to wear a mask while they are taking care of you, please let them know. You may have one support person who is at least 66 years old accompany you for your appointments.

## 2021-12-24 ENCOUNTER — Other Ambulatory Visit: Payer: Self-pay

## 2021-12-28 ENCOUNTER — Encounter: Payer: Self-pay | Admitting: Hematology

## 2021-12-28 NOTE — Progress Notes (Signed)
Dana Butler   HEMATOLOGY/ONCOLOGY CLINIC VISIT NOTE  Date of Service: 12/22/2021    Patient Care Team: Sueanne Margarita, DO as PCP - General (Internal Medicine) Jodi Marble, MD as Consulting Physician (Otolaryngology) Armandina Gemma, MD as Consulting Physician (General Surgery) Kennith Center, RD as Dietitian (Family Medicine)  CHIEF COMPLAINTS/PURPOSE OF CONSULTATION: Follow-up prior to cycle 4 of Bendamustine Rituxan chemotherapy  HISTORY OF PRESENTING ILLNESS:  Please see previous note for details on initial presentation  INTERVAL HISTORY:  Dana Butler is a 66 y.o. female here for follow-up prior to cycle 4 of Bendamustine Rituxan chemotherapy for stage IV marginal zone lymphoma.. Patient notes no acute new symptoms since her last clinic visit. She notes no notable toxicities other than grade 1 fatigue from her third cycle of Bendamustine Rituxan chemotherapy. No fevers no chills no night sweats. No new lumps or bumps. Slight left pinkeye with discharge.  Has been using gentamicin eyedrops with improvement.  Prescribed erythromycin ointment. Labs done today were discussed in detail with the patient.  MEDICAL HISTORY:  Past Medical History:  Diagnosis Date   Cancer ([place]) 2016   marginal zone non-hodgkins lymphoma   Medical history non-contributory     SURGICAL HISTORY: Past Surgical History:  Procedure Laterality Date   ABDOMINAL HYSTERECTOMY     AXILLARY LYMPH NODE BIOPSY Left 06/29/2014   Procedure: AXILLARY LYMPH NODE BIOPSY;  Surgeon: Armandina Gemma, MD;  Location: [place] [facility];  Service: General;  Laterality: Left;   COLONOSCOPY     DIAGNOSTIC LAPAROSCOPY  2005   BSO   MASS EXCISION N/A 02/23/2019   Procedure: Excision of umbilicus skin lesion;  Surgeon: Wallace Going, DO;  Location: [facility];  Service: Plastics;  Laterality: N/A;    SOCIAL HISTORY: Social History   Socioeconomic History   Marital status: Married    Spouse name: Not  on file   Number of children: Not on file   Years of education: Not on file   Highest education level: Not on file  Occupational History   Not on file  Tobacco Use   Smoking status: Never   Smokeless tobacco: Never  Substance and Sexual Activity   Alcohol use: No   Drug use: No   Sexual activity: Not on file  Other Topics Concern   Not on file  Social History Narrative   Not on file   Social Determinants of Health   Financial Resource Strain: Not on file  Food Insecurity: Not on file  Transportation Needs: Not on file  Physical Activity: Not on file  Stress: Not on file  Social Connections: Not on file  Intimate Partner Violence: Not on file    FAMILY HISTORY: Family History  Problem Relation Age of Onset   Breast cancer Paternal Aunt     ALLERGIES:  is allergic to chloraprep one step [chlorhexidine gluconate], latex, and tape.  MEDICATIONS:  Current Outpatient Medications  Medication Sig Dispense Refill   erythromycin ophthalmic ointment Place 1 Application into the left eye at bedtime. 3.5 g 0   acyclovir (ZOVIRAX) 400 MG tablet Take 1 tablet (400 mg total) by mouth daily. 30 tablet 3   ALPRAZolam (XANAX) 0.5 MG tablet      butalbital-acetaminophen-caffeine (FIORICET) 50-325-40 MG tablet Take 1-2 tablets by mouth every 6 (six) hours as needed for headache. 20 tablet 0   calcium carbonate (OS-CAL) 1250 (500 Ca) MG chewable tablet Chew by mouth.     Cholecalciferol (VITAMIN D3) 5000 UNITS TABS Take by  mouth.     dexamethasone (DECADRON) 4 MG tablet Take 2 tablets (8 mg total) by mouth daily. Take 2 tablets daily. Start the day after bendamustine chemotherapy for 2 days. Take with food. 60 tablet 1   ergocalciferol (VITAMIN D2) 1.25 MG (50000 UT) capsule Take 1 capsule (50,000 Units total) by mouth once a week. 8 capsule 2   estradiol (VIVELLE-DOT) 0.05 MG/24HR patch Place 1 patch onto the skin 2 (two) times a week.     ferrous sulfate 325 (65 FE) MG EC tablet Take 1  tablet (325 mg total) by mouth daily with breakfast.  3   glucosamine-chondroitin 500-400 MG tablet Take by mouth.     Multiple Vitamins-Minerals (MULTIVITAMIN WITH MINERALS) tablet Take 1 tablet by mouth daily.     OMEGA-3 FATTY ACIDS-VITAMIN E PO 1,000 capsules. Take 2 capsules by mouth daily.     ondansetron (ZOFRAN) 8 MG tablet Take 1 tablet (8 mg total) by mouth 2 (two) times daily as needed for nausea or vomiting (2 times daily PRN for refractory nausea / vomiting). Start on day 2 after bendamustine chemo. (Patient not taking: Reported on 12/22/2021) 20 tablet 1   prochlorperazine (COMPAZINE) 10 MG tablet Take 1 tablet (10 mg total) by mouth every 6 (six) hours as needed for nausea or vomiting. (Patient not taking: Reported on 12/22/2021) 30 tablet 1   riTUXimab (RITUXAN) 100 MG/10ML injection See admin instructions.     Turmeric 450 MG CAPS as directed Orally     zaleplon (SONATA) 10 MG capsule Take 10 mg by mouth at bedtime.     No current facility-administered medications for this visit.    REVIEW OF SYSTEMS:   10 Point review of Systems was done is negative except as noted above.  PHYSICAL EXAMINATION:. Wt Readings from Last 3 Encounters:  12/22/21 124 lb 9.6 oz (56.5 kg)  11/24/21 121 lb 3.2 oz (55 kg)  10/27/21 127 lb 4 oz (57.7 kg)   Vitals:   12/22/21 1043  BP: (!) 144/66  Pulse: 63  Resp: 14  Temp: (!) 97.4 F (36.3 C)  SpO2: 96%   NAD GENERAL:alert, in no acute distress and comfortable SKIN: no acute rashes, no significant lesions EYES: Left eye with conjunctival congestion and minimal mucoid discharge. OROPHARYNX: MMM, no exudates, no oropharyngeal erythema or ulceration NECK: supple, no JVD LYMPH:  no palpable lymphadenopathy in the cervical, axillary or inguinal regions LUNGS: clear to auscultation b/l with normal respiratory effort HEART: regular rate & rhythm ABDOMEN:  normoactive bowel sounds , non tender, not distended. Extremity: no pedal edema PSYCH:  alert & oriented x 3 with fluent speech NEURO: no focal motor/sensory deficits  LABORATORY DATA:  I have reviewed the data as listed  .    Latest Ref Rng & Units 12/22/2021   10:12 AM 11/24/2021   10:12 AM 10/27/2021   10:11 AM  CBC  WBC 4.0 - 10.5 K/uL 3.7  3.1  5.2   Hemoglobin 12.0 - 15.0 g/dL 12.2  11.9  12.1   Hematocrit 36.0 - 46.0 % 34.9  34.3  35.2   Platelets 150 - 400 K/uL 198  259  255   ANC 1700  .    Latest Ref Rng & Units 12/22/2021   10:12 AM 11/24/2021   10:12 AM 10/27/2021   10:11 AM  CMP  Glucose 70 - 99 mg/dL 97  111  93   BUN 8 - 23 mg/dL '8  6  13   '$ Creatinine  0.44 - 1.00 mg/dL 0.51  0.68  0.67   Sodium 135 - 145 mmol/L 140  139  138   Potassium 3.5 - 5.1 mmol/L 3.9  3.7  4.1   Chloride 98 - 111 mmol/L 109  106  106   CO2 22 - 32 mmol/L '28  28  28   '$ Calcium 8.9 - 10.3 mg/dL 9.5  9.2  9.0   Total Protein 6.5 - 8.1 g/dL 6.0  6.2  6.2   Total Bilirubin 0.3 - 1.2 mg/dL 0.3  0.4  0.3   Alkaline Phos 38 - 126 U/L 136  118  173   AST 15 - 41 U/L '23  29  27   '$ ALT 0 - 44 U/L '17  21  20     '$ Lab Results  Component Value Date   LDH 130 12/22/2021     RADIOGRAPHIC STUDIES: I have personally reviewed the radiological images as listed and agreed with the findings in the report. No results found.  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 Initially diagnosed in 2016. Patient was diagnosed with nodal marginal zone lymphoma based on lymph node biopsy of her left axillary lymph node on 06/29/2014 and has been following with Dr. Jana Hakim for active surveillance and until recently did not meet criteria for consideration of treatment. In December 2019 on 04/12/2018 the patient had a biopsy of a right breast mass that showed pathology consistent with marginal zone lymphoma.  Patient over the last year or so has had increasing WBC counts which were as high as 91.2k in December 2022.  Patient also noted to have  some anemia with hemoglobin of 11 and normal platelet counts of 234k in January 2023.  She also started developing some lymphadenopathy in the neck.  Patient is status post Rituxan every 3 weeks x 4 doses and recently was started on maintenance Rituxan  PET CT scan done 09/08/2021 revealed "1. Bulky axillary adenopathy periaortic adenopathy and iliac adenopathy with very low metabolic activity ( [place] 2). Findings consistent with low-grade lymphoma. 2. Normal spleen. 3. Diffuse permeative pattern within the pelvis and spine. No hypermetabolic skeletal lesions."  #2 Extensive bone involvement from marginal zone lymphoma #3 headaches without any red flags likely migrainous versus tension headaches.  Now much better controlled.  Plan -Patient's labs done today were discussed in detail with her and show stable CBC CMP and LDH No notable toxicities from her cycle 3 of Bendamustine Rituxan other than grade 1-2 fatigue. Patient is appropriate to proceed with cycle 4 which is her last planned cycle of Bendamustine Rituxan with dose reduced Bendamustine at 70 mg per metered squared. Chemoimmunotherapy orders were reviewed and signed Patient plan to get a PET CT scan in 6 weeks We will see her back in 8 weeks with plans to start maintenance Rituxan Follow-up PET/CT in 6 weeks RTC with Dr Irene Limbo with labs and starting maintenance Rituxan in 2 months  The total time spent in the appointment was 30 minutes*.  All of the patient's questions were answered with apparent satisfaction. The patient knows to call the clinic with any problems, questions or concerns.   Sullivan Lone MD MS AAHIVMS [facility] Hematology/Oncology Physician [facility]  .*Total Encounter Time as defined by the Centers for Medicare and Medicaid Services includes, in addition to the face-to-face time of a patient visit (documented in the note above) non-face-to-face time: obtaining and reviewing outside  history,  ordering and reviewing medications, tests or procedures, care coordination (communications with other health care professionals or caregivers) and documentation in the medical record.

## 2022-01-15 ENCOUNTER — Other Ambulatory Visit: Payer: 59

## 2022-01-15 ENCOUNTER — Ambulatory Visit: Payer: 59 | Admitting: Hematology and Oncology

## 2022-01-15 ENCOUNTER — Ambulatory Visit: Payer: 59

## 2022-01-20 ENCOUNTER — Other Ambulatory Visit: Payer: Self-pay | Admitting: Hematology

## 2022-01-22 ENCOUNTER — Other Ambulatory Visit: Payer: Self-pay | Admitting: Hematology

## 2022-01-22 DIAGNOSIS — Z7189 Other specified counseling: Secondary | ICD-10-CM

## 2022-01-22 DIAGNOSIS — C8580 Other specified types of non-Hodgkin lymphoma, unspecified site: Secondary | ICD-10-CM

## 2022-01-22 NOTE — Progress Notes (Signed)
Maintenance Rituxan orders placed

## 2022-01-30 ENCOUNTER — Telehealth: Payer: Self-pay | Admitting: Hematology

## 2022-01-30 NOTE — Telephone Encounter (Signed)
Scheduled follow-up appointment per 8/28 los. Patient is aware.

## 2022-02-05 ENCOUNTER — Ambulatory Visit (HOSPITAL_COMMUNITY)
Admission: RE | Admit: 2022-02-05 | Discharge: 2022-02-05 | Disposition: A | Discharge status: Home / Self Care | Payer: Medicare Other | Source: Ambulatory Visit | Attending: Hematology | Admitting: Hematology

## 2022-02-05 DIAGNOSIS — C8580 Other specified types of non-Hodgkin lymphoma, unspecified site: Secondary | ICD-10-CM | POA: Diagnosis present

## 2022-02-05 DIAGNOSIS — Z01818 Encounter for other preprocedural examination: Secondary | ICD-10-CM | POA: Insufficient documentation

## 2022-02-05 DIAGNOSIS — M810 Age-related osteoporosis without current pathological fracture: Secondary | ICD-10-CM | POA: Diagnosis not present

## 2022-02-05 DIAGNOSIS — C8300 Small cell B-cell lymphoma, unspecified site: Secondary | ICD-10-CM | POA: Diagnosis not present

## 2022-02-05 LAB — GLUCOSE, CAPILLARY: Glucose-Capillary: 96 mg/dL (ref 70–99)

## 2022-02-05 MED ORDER — FLUDEOXYGLUCOSE F - 18 (FDG) INJECTION
6.2000 | Freq: Once | INTRAVENOUS | Status: AC
  Administered 2022-02-05: 6.19 via INTRAVENOUS

## 2022-02-13 ENCOUNTER — Other Ambulatory Visit: Payer: Self-pay

## 2022-02-13 DIAGNOSIS — C8580 Other specified types of non-Hodgkin lymphoma, unspecified site: Secondary | ICD-10-CM

## 2022-02-16 ENCOUNTER — Other Ambulatory Visit: Payer: Self-pay

## 2022-02-16 ENCOUNTER — Inpatient Hospital Stay (HOSPITAL_BASED_OUTPATIENT_CLINIC_OR_DEPARTMENT_OTHER): Payer: Medicare Other | Admitting: Hematology

## 2022-02-16 ENCOUNTER — Inpatient Hospital Stay: Payer: Medicare Other | Attending: Oncology

## 2022-02-16 ENCOUNTER — Inpatient Hospital Stay: Payer: Medicare Other

## 2022-02-16 VITALS — BP 145/80 | HR 64 | Temp 97.8°F | Resp 16 | Wt 127.8 lb

## 2022-02-16 DIAGNOSIS — Z5111 Encounter for antineoplastic chemotherapy: Secondary | ICD-10-CM | POA: Insufficient documentation

## 2022-02-16 DIAGNOSIS — C8580 Other specified types of non-Hodgkin lymphoma, unspecified site: Secondary | ICD-10-CM

## 2022-02-16 DIAGNOSIS — Z7189 Other specified counseling: Secondary | ICD-10-CM | POA: Diagnosis not present

## 2022-02-16 DIAGNOSIS — M810 Age-related osteoporosis without current pathological fracture: Secondary | ICD-10-CM

## 2022-02-16 DIAGNOSIS — Z803 Family history of malignant neoplasm of breast: Secondary | ICD-10-CM | POA: Diagnosis not present

## 2022-02-16 LAB — LACTATE DEHYDROGENASE: LDH: 140 U/L (ref 98–192)

## 2022-02-16 LAB — CBC WITH DIFFERENTIAL (CANCER CENTER ONLY)
Abs Immature Granulocytes: 0 10*3/uL (ref 0.00–0.07)
Basophils Absolute: 0.1 10*3/uL (ref 0.0–0.1)
Basophils Relative: 2 %
Eosinophils Absolute: 0.4 10*3/uL (ref 0.0–0.5)
Eosinophils Relative: 13 %
HCT: 36.3 % (ref 36.0–46.0)
Hemoglobin: 12.3 g/dL (ref 12.0–15.0)
Immature Granulocytes: 0 %
Lymphocytes Relative: 10 %
Lymphs Abs: 0.3 10*3/uL — ABNORMAL LOW (ref 0.7–4.0)
MCH: 31.4 pg (ref 26.0–34.0)
MCHC: 33.9 g/dL (ref 30.0–36.0)
MCV: 92.6 fL (ref 80.0–100.0)
Monocytes Absolute: 0.4 10*3/uL (ref 0.1–1.0)
Monocytes Relative: 12 %
Neutro Abs: 1.8 10*3/uL (ref 1.7–7.7)
Neutrophils Relative %: 63 %
Platelet Count: 219 10*3/uL (ref 150–400)
RBC: 3.92 MIL/uL (ref 3.87–5.11)
RDW: 14.1 % (ref 11.5–15.5)
WBC Count: 2.8 10*3/uL — ABNORMAL LOW (ref 4.0–10.5)
nRBC: 0 % (ref 0.0–0.2)

## 2022-02-16 LAB — CMP (CANCER CENTER ONLY)
ALT: 16 U/L (ref 0–44)
AST: 22 U/L (ref 15–41)
Albumin: 4 g/dL (ref 3.5–5.0)
Alkaline Phosphatase: 125 U/L (ref 38–126)
Anion gap: 5 (ref 5–15)
BUN: 11 mg/dL (ref 8–23)
CO2: 27 mmol/L (ref 22–32)
Calcium: 9.2 mg/dL (ref 8.9–10.3)
Chloride: 107 mmol/L (ref 98–111)
Creatinine: 0.67 mg/dL (ref 0.44–1.00)
GFR, Estimated: >60 mL/min (ref >60)
Glucose, Bld: 98 mg/dL (ref 70–99)
Potassium: 4 mmol/L (ref 3.5–5.1)
Sodium: 139 mmol/L (ref 135–145)
Total Bilirubin: 0.4 mg/dL (ref 0.3–1.2)
Total Protein: 6.4 g/dL — ABNORMAL LOW (ref 6.5–8.1)

## 2022-02-16 MED ORDER — METHYLPREDNISOLONE SODIUM SUCC 125 MG IJ SOLR
125.0000 mg | Freq: Every day | INTRAMUSCULAR | Status: DC
  Administered 2022-02-16: 125 mg via INTRAVENOUS
  Filled 2022-02-16: qty 2

## 2022-02-16 MED ORDER — FAMOTIDINE IN NACL 20-0.9 MG/50ML-% IV SOLN
20.0000 mg | Freq: Once | INTRAVENOUS | Status: AC
  Administered 2022-02-16: 20 mg via INTRAVENOUS
  Filled 2022-02-16: qty 50

## 2022-02-16 MED ORDER — DIPHENHYDRAMINE HCL 25 MG PO CAPS
50.0000 mg | ORAL_CAPSULE | Freq: Once | ORAL | Status: AC
  Administered 2022-02-16: 50 mg via ORAL
  Filled 2022-02-16: qty 2

## 2022-02-16 MED ORDER — ACETAMINOPHEN 325 MG PO TABS
650.0000 mg | ORAL_TABLET | Freq: Once | ORAL | Status: AC
  Administered 2022-02-16: 650 mg via ORAL
  Filled 2022-02-16: qty 2

## 2022-02-16 MED ORDER — SODIUM CHLORIDE 0.9 % IV SOLN: 375.0000 mg/m2 | Freq: Once | INTRAVENOUS | Status: DC

## 2022-02-16 MED ORDER — SODIUM CHLORIDE 0.9 % IV SOLN: Freq: Once | INTRAVENOUS | Status: AC

## 2022-02-16 MED ORDER — SODIUM CHLORIDE 0.9 % IV SOLN
375.0000 mg/m2 | Freq: Once | INTRAVENOUS | Status: AC
  Administered 2022-02-16: 600 mg via INTRAVENOUS
  Filled 2022-02-16: qty 50

## 2022-02-16 NOTE — Patient Instructions (Signed)
[facility]COL[facility]  Discharge Instructions: Thank you for choosing [facility] to provide your oncology and hematology care.   If you have a lab appointment with the [facility], please go directly to the [facility] and check in at the registration area.   Wear comfortable clothing and clothing appropriate for easy access to any Portacath or PICC line.   We strive to give you quality time with your provider. You may need to reschedule your appointment if you arrive late (15 or more minutes).  Arriving late affects you and other patients whose appointments are after yours.  Also, if you miss three or more appointments without notifying the office, you may be dismissed from the clinic at the provider's discretion.      For prescription refill requests, have your pharmacy contact our office and allow 72 hours for refills to be completed.    Today you received the following chemotherapy and/or immunotherapy agents: rituximab-pvvr      To help prevent nausea and vomiting after your treatment, we encourage you to take your nausea medication as directed.  BELOW ARE SYMPTOMS THAT SHOULD BE REPORTED IMMEDIATELY: *FEVER GREATER THAN 100.4 F (38 C) OR HIGHER *CHILLS OR SWEATING *NAUSEA AND VOMITING THAT IS NOT CONTROLLED WITH YOUR NAUSEA MEDICATION *UNUSUAL SHORTNESS OF BREATH *UNUSUAL BRUISING OR BLEEDING *URINARY PROBLEMS (pain or burning when urinating, or frequent urination) *BOWEL PROBLEMS (unusual diarrhea, constipation, pain near the anus) TENDERNESS IN MOUTH AND THROAT WITH OR WITHOUT PRESENCE OF ULCERS (sore throat, sores in mouth, or a toothache) UNUSUAL RASH, SWELLING OR PAIN  UNUSUAL VAGINAL DISCHARGE OR ITCHING   Items with * indicate a potential emergency and should be followed up as soon as possible or go to the Emergency Department if any problems should occur.  Please show the CHEMOTHERAPY ALERT CARD or IMMUNOTHERAPY ALERT CARD at  check-in to the Emergency Department and triage nurse.  Should you have questions after your visit or need to cancel or reschedule your appointment, please contact [facility]  Dept: [phone]  and follow the prompts.  Office hours are 8:00 a.m. to 4:30 p.m. Monday - Friday. Please note that voicemails left after 4:00 p.m. may not be returned until the following business day.  We are closed weekends and major holidays. You have access to a nurse at all times for urgent questions. Please call the main number to the clinic Dept: [phone] and follow the prompts.   For any non-urgent questions, you may also contact your provider using MyChart. We now offer e-Visits for anyone 50 and older to request care online for non-urgent symptoms. For details visit [email or web address].   Also download the MyChart app! Go to the app store, search "MyChart", open the app, select [facility], and log in with your MyChart username and password.  Masks are optional in the cancer centers. If you would like for your care team to wear a mask while they are taking care of you, please let them know. You may have one support person who is at least 66 years old accompany you for your appointments.

## 2022-02-16 NOTE — Progress Notes (Signed)
[name]   HEMATOLOGY/ONCOLOGY CLINIC VISIT NOTE  Date of Service: 02/16/22     Patient Care Team: Sueanne Margarita, DO as PCP - General (Internal Medicine) Jodi Marble, MD as Consulting Physician (Otolaryngology) Armandina Gemma, MD as Consulting Physician (General Surgery) Kennith Center, RD as Dietitian (Family Medicine)  CHIEF COMPLAINTS/PURPOSE OF CONSULTATION: Follow-up prior starting maintenance Rituxan.  HISTORY OF PRESENTING ILLNESS:  Please see previous note for details on initial presentation  INTERVAL HISTORY:  Dana Butler is a 66 y.o. female here today for continued evaluation and management of stage IV marginal zone lymphoma. She is starting her maintenance Rituxan today.  Patient was last seen by me on 12/22/2021 and was doing well with no new symptoms.   Patient has been feeling well from her past visit. She reports she has gained more energy.   She notes she had bone density study about two years ago, which showed that she was on the borderline of osteopenia. She denies being on any osteopenia medicine.  She has received her Shingles vaccine and the influenza vaccine.   She had several questions about future treatment, which were answered.    MEDICAL HISTORY:  Past Medical History:  Diagnosis Date   Cancer ([facility]) 2016   marginal zone non-hodgkins lymphoma   Medical history non-contributory     SURGICAL HISTORY: Past Surgical History:  Procedure Laterality Date   ABDOMINAL HYSTERECTOMY     AXILLARY LYMPH NODE BIOPSY Left 06/29/2014   Procedure: AXILLARY LYMPH NODE BIOPSY;  Surgeon: Armandina Gemma, MD;  Location: [facility];  Service: General;  Laterality: Left;   COLONOSCOPY     DIAGNOSTIC LAPAROSCOPY  2005   BSO   MASS EXCISION N/A 02/23/2019   Procedure: Excision of umbilicus skin lesion;  Surgeon: Wallace Going, DO;  Location: [facility];  Service: Plastics;  Laterality: N/A;    SOCIAL HISTORY: Social History    Socioeconomic History   Marital status: Married    Spouse name: Not on file   Number of children: Not on file   Years of education: Not on file   Highest education level: Not on file  Occupational History   Not on file  Tobacco Use   Smoking status: Never   Smokeless tobacco: Never  Substance and Sexual Activity   Alcohol use: No   Drug use: No   Sexual activity: Not on file  Other Topics Concern   Not on file  Social History Narrative   Not on file   Social Determinants of Health   Financial Resource Strain: Not on file  Food Insecurity: Not on file  Transportation Needs: Not on file  Physical Activity: Not on file  Stress: Not on file  Social Connections: Not on file  Intimate Partner Violence: Not on file    FAMILY HISTORY: Family History  Problem Relation Age of Onset   Breast cancer Paternal Aunt     ALLERGIES:  is allergic to chloraprep one step [chlorhexidine gluconate], latex, and tape.  MEDICATIONS:  Current Outpatient Medications  Medication Sig Dispense Refill   acyclovir (ZOVIRAX) 400 MG tablet Take 1 tablet (400 mg total) by mouth daily. 30 tablet 3   ALPRAZolam (XANAX) 0.5 MG tablet      butalbital-acetaminophen-caffeine (FIORICET) 50-325-40 MG tablet Take 1-2 tablets by mouth every 6 (six) hours as needed for headache. 20 tablet 0   calcium carbonate (OS-CAL) 1250 (500 Ca) MG chewable tablet Chew by mouth.     Cholecalciferol (VITAMIN D3)  5000 UNITS TABS Take by mouth.     dexamethasone (DECADRON) 4 MG tablet Take 2 tablets (8 mg total) by mouth daily. Take 2 tablets daily. Start the day after bendamustine chemotherapy for 2 days. Take with food. 60 tablet 1   ergocalciferol (VITAMIN D2) 1.25 MG (50000 UT) capsule Take 1 capsule (50,000 Units total) by mouth once a week. 8 capsule 2   erythromycin ophthalmic ointment Place 1 Application into the left eye at bedtime. 3.5 g 0   estradiol (VIVELLE-DOT) 0.05 MG/24HR patch Place 1 patch onto the skin 2  (two) times a week.     ferrous sulfate 325 (65 FE) MG EC tablet Take 1 tablet (325 mg total) by mouth daily with breakfast.  3   glucosamine-chondroitin 500-400 MG tablet Take by mouth.     Multiple Vitamins-Minerals (MULTIVITAMIN WITH MINERALS) tablet Take 1 tablet by mouth daily.     OMEGA-3 FATTY ACIDS-VITAMIN E PO 1,000 capsules. Take 2 capsules by mouth daily.     ondansetron (ZOFRAN) 8 MG tablet Take 1 tablet (8 mg total) by mouth 2 (two) times daily as needed for nausea or vomiting (2 times daily PRN for refractory nausea / vomiting). Start on day 2 after bendamustine chemo. (Patient not taking: Reported on 12/22/2021) 20 tablet 1   prochlorperazine (COMPAZINE) 10 MG tablet Take 1 tablet (10 mg total) by mouth every 6 (six) hours as needed for nausea or vomiting. (Patient not taking: Reported on 12/22/2021) 30 tablet 1   riTUXimab (RITUXAN) 100 MG/10ML injection See admin instructions.     Turmeric 450 MG CAPS as directed Orally     zaleplon (SONATA) 10 MG capsule Take 10 mg by mouth at bedtime.     No current facility-administered medications for this visit.    REVIEW OF SYSTEMS:   10 Point review of Systems was done is negative except as noted above.  PHYSICAL EXAMINATION:. Wt Readings from Last 3 Encounters:  12/22/21 124 lb 9.6 oz (56.5 kg)  11/24/21 121 lb 3.2 oz (55 kg)  10/27/21 127 lb 4 oz (57.7 kg)   There were no vitals filed for this visit.  NAD GENERAL:alert, in no acute distress and comfortable SKIN: no acute rashes, no significant lesions EYES: Left eye with conjunctival congestion and minimal mucoid discharge. OROPHARYNX: MMM, no exudates, no oropharyngeal erythema or ulceration NECK: supple, no JVD LYMPH:  no palpable lymphadenopathy in the cervical, axillary or inguinal regions LUNGS: clear to auscultation b/l with normal respiratory effort HEART: regular rate & rhythm ABDOMEN:  normoactive bowel sounds , non tender, not distended. Extremity: no pedal  edema PSYCH: alert & oriented x 3 with fluent speech NEURO: no focal motor/sensory deficits  LABORATORY DATA:  I have reviewed the data as listed  .    Latest Ref Rng & Units 02/16/2022    9:10 AM 12/22/2021   10:12 AM 11/24/2021   10:12 AM  CBC  WBC 4.0 - 10.5 K/uL 2.8  3.7  3.1   Hemoglobin 12.0 - 15.0 g/dL 12.3  12.2  11.9   Hematocrit 36.0 - 46.0 % 36.3  34.9  34.3   Platelets 150 - 400 K/uL 219  198  259   ANC 1700  .    Latest Ref Rng & Units 02/16/2022    9:10 AM 12/22/2021   10:12 AM 11/24/2021   10:12 AM  CMP  Glucose 70 - 99 mg/dL 98  97  111   BUN 8 - 23 mg/dL 11  8  6   Creatinine 0.44 - 1.00 mg/dL 0.67  0.51  0.68   Sodium 135 - 145 mmol/L 139  140  139   Potassium 3.5 - 5.1 mmol/L 4.0  3.9  3.7   Chloride 98 - 111 mmol/L 107  109  106   CO2 22 - 32 mmol/L '27  28  28   '$ Calcium 8.9 - 10.3 mg/dL 9.2  9.5  9.2   Total Protein 6.5 - 8.1 g/dL 6.4  6.0  6.2   Total Bilirubin 0.3 - 1.2 mg/dL 0.4  0.3  0.4   Alkaline Phos 38 - 126 U/L 125  136  118   AST 15 - 41 U/L '22  23  29   '$ ALT 0 - 44 U/L '16  17  21     '$ Lab Results  Component Value Date   LDH 140 02/16/2022     RADIOGRAPHIC STUDIES: I have personally reviewed the radiological images as listed and agreed with the findings in the report. NM PET Image Restag (PS) Skull Base To Thigh  Result Date: 02/07/2022 CLINICAL DATA:  Subsequent treatment strategy for marginal zone lymphoma. EXAM: NUCLEAR MEDICINE PET SKULL BASE TO THIGH TECHNIQUE: 6.19 mCi F-18 FDG was injected intravenously. Full-ring PET imaging was performed from the skull base to thigh after the radiotracer. CT data was obtained and used for attenuation correction and anatomic localization. Fasting blood glucose: 96 mg/dl COMPARISON:  Multiple prior PET CTs. The most recent is 09/08/2021. Most recent CT examination is 09/08/2021 FINDINGS: Mediastinal blood pool activity: SUV max 1.70 Liver activity: SUV max 3.97 NECK: No hypermetabolic lymph nodes in  the neck. Incidental CT findings: None. CHEST: Interval decrease in size of bilateral axillary lymph nodes. 8 mm node near the lateral aspect of the pectoralis minor muscle on image 57/4 previously measured 16 mm. SUV max is 2.48 (Deauville 2) and was previously 1.72. Adjacent node anterior to the teres minor muscle measures 9 mm on image 57/4. This previously measured 14 mm. SUV max is 2.53 (Deauville 2) and was previously 1.76. Right axillary nodes are much smaller. These are all sub 8 mm and measured up to 14 mm on the prior study. Very low level hypermetabolism with SUV max of 1.13 (Deauville 1). This was previously 1.4. No mediastinal or hilar lymphadenopathy. No worrisome pulmonary nodules. No hypermetabolic breast masses. Incidental CT findings: Stable scattered vascular calcifications. ABDOMEN/PELVIS: Much improved abdominal and pelvic lymphadenopathy. Masslike area of retroperitoneal adenopathy seen on the prior study is no longer present. An index left-sided retroperitoneal node measures a maximum of 17 mm on image 118/4 and previously measured 27 mm. SUV max is 1.82 and was previously 2.19. Left lateral external iliac node measures 7 mm on image 167/4 and previously measured 22 mm. SUV max is 1.80 and was previously 2.14 (Deauville 2). Left inguinal node measures 9 mm on image 181/4 and previously measured 17 mm. SUV max is 1.59 (Deauville 1) and was previously 2.10. The solid abdominal organs are unremarkable. No splenic hypermetabolism or splenic lesions. Incidental CT findings: Stable scattered vascular calcifications. SKELETON: No hypermetabolic bone disease is identified. Stable aggressive appearing pattern of osteoporosis as seen previously. Incidental CT findings: None. IMPRESSION: 1. Significant interval decrease in size of adenopathy involving the chest, abdomen and pelvis as detailed above. Persistent low level hypermetabolism. Deauville 1 and Deauville 2 as above. Findings suggest a good  response to treatment. 2. No new or progressive areas of disease. 3. Persistent aggressive pattern  osteoporosis but no hypermetabolic bone disease is identified. Electronically Signed   By: Marijo Sanes M.D.   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 Initially diagnosed in 2016. Patient was diagnosed with nodal marginal zone lymphoma based on lymph node biopsy of her left axillary lymph node on 06/29/2014 and has been following with Dr. Jana Hakim for active surveillance and until recently did not meet criteria for consideration of treatment. In December 2019 on 04/12/2018 the patient had a biopsy of a right breast mass that showed pathology consistent with marginal zone lymphoma.  Patient over the last year or so has had increasing WBC counts which were as high as 91.2k in December 2022.  Patient also noted to have some anemia with hemoglobin of 11 and normal platelet counts of 234k in January 2023.  She also started developing some lymphadenopathy in the neck.  Patient is status post Rituxan every 3 weeks x 4 doses and recently was started on maintenance Rituxan  PET CT scan done 09/08/2021 revealed "1. Bulky axillary adenopathy periaortic adenopathy and iliac adenopathy with very low metabolic activity ( Deauville 2). Findings consistent with low-grade lymphoma. 2. Normal spleen. 3. Diffuse permeative pattern within the pelvis and spine. No hypermetabolic skeletal lesions."  #2 Extensive bone involvement from marginal zone lymphoma #3 headaches without any red flags likely migrainous versus tension headaches.  Now much better controlled.  Plan -Patient's labs done today were discussed in detail with her and show stable CBC CMP and LDH -Discussed her PET scan results with the patient. PET scan showed normal results.  -Started her maintenance Rituximab today. -Recommended her to get COVID-19 booster and RSV  vaccine.  -Will order bone density study.   Follow-up: Per integrated scheduling  The total time spent in the appointment was 30 minutes* .  All of the patient's questions were answered with apparent satisfaction. The patient knows to call the clinic with any problems, questions or concerns.   Zettie Cooley, am acting as a scribe for Sullivan Lone, MD.  Sullivan Lone MD [place] AAHIVMS [facility] Hematology/Oncology Physician [facility]  .*Total Encounter Time as defined by the Centers for Medicare and Medicaid Services includes, in addition to the face-to-face time of a patient visit (documented in the note above) non-face-to-face time: obtaining and reviewing outside history, ordering and reviewing medications, tests or procedures, care coordination (communications with other health care professionals or caregivers) and documentation in the medical record.

## 2022-02-22 ENCOUNTER — Encounter: Payer: Self-pay | Admitting: Hematology

## 2022-02-22 NOTE — Patient Instructions (Signed)
tegrea

## 2022-02-23 ENCOUNTER — Other Ambulatory Visit: Payer: Self-pay | Admitting: Hematology

## 2022-02-23 DIAGNOSIS — M810 Age-related osteoporosis without current pathological fracture: Secondary | ICD-10-CM

## 2022-04-07 ENCOUNTER — Ambulatory Visit
Admission: RE | Admit: 2022-04-07 | Discharge: 2022-04-07 | Disposition: A | Discharge status: Home / Self Care | Payer: Medicare Other | Source: Ambulatory Visit | Attending: Hematology | Admitting: Hematology

## 2022-04-07 DIAGNOSIS — M810 Age-related osteoporosis without current pathological fracture: Secondary | ICD-10-CM

## 2022-04-10 ENCOUNTER — Other Ambulatory Visit: Payer: Self-pay | Admitting: Hematology

## 2022-04-10 DIAGNOSIS — C8580 Other specified types of non-Hodgkin lymphoma, unspecified site: Secondary | ICD-10-CM

## 2022-04-15 ENCOUNTER — Other Ambulatory Visit: Payer: Self-pay | Admitting: Obstetrics and Gynecology

## 2022-04-15 DIAGNOSIS — Z1231 Encounter for screening mammogram for malignant neoplasm of breast: Secondary | ICD-10-CM

## 2022-04-16 ENCOUNTER — Other Ambulatory Visit: Payer: 59

## 2022-04-16 ENCOUNTER — Ambulatory Visit: Payer: 59

## 2022-04-16 ENCOUNTER — Ambulatory Visit: Payer: 59 | Admitting: Hematology and Oncology

## 2022-04-23 ENCOUNTER — Other Ambulatory Visit: Payer: Self-pay

## 2022-04-23 ENCOUNTER — Inpatient Hospital Stay (HOSPITAL_BASED_OUTPATIENT_CLINIC_OR_DEPARTMENT_OTHER): Payer: Medicare Other | Admitting: Hematology

## 2022-04-23 ENCOUNTER — Inpatient Hospital Stay: Payer: Medicare Other | Attending: Oncology

## 2022-04-23 ENCOUNTER — Inpatient Hospital Stay: Payer: Medicare Other

## 2022-04-23 VITALS — BP 145/70 | HR 64 | Temp 97.6°F | Resp 17

## 2022-04-23 DIAGNOSIS — Z5112 Encounter for antineoplastic immunotherapy: Secondary | ICD-10-CM | POA: Diagnosis present

## 2022-04-23 DIAGNOSIS — Z7189 Other specified counseling: Secondary | ICD-10-CM

## 2022-04-23 DIAGNOSIS — C8580 Other specified types of non-Hodgkin lymphoma, unspecified site: Secondary | ICD-10-CM | POA: Insufficient documentation

## 2022-04-23 DIAGNOSIS — Z803 Family history of malignant neoplasm of breast: Secondary | ICD-10-CM | POA: Insufficient documentation

## 2022-04-23 DIAGNOSIS — M8580 Other specified disorders of bone density and structure, unspecified site: Secondary | ICD-10-CM | POA: Insufficient documentation

## 2022-04-23 LAB — CBC WITH DIFFERENTIAL (CANCER CENTER ONLY)
Abs Immature Granulocytes: 0.01 10*3/uL (ref 0.00–0.07)
Basophils Absolute: 0 10*3/uL (ref 0.0–0.1)
Basophils Relative: 1 %
Eosinophils Absolute: 0.2 10*3/uL (ref 0.0–0.5)
Eosinophils Relative: 8 %
HCT: 36.1 % (ref 36.0–46.0)
Hemoglobin: 12.6 g/dL (ref 12.0–15.0)
Immature Granulocytes: 0 %
Lymphocytes Relative: 9 %
Lymphs Abs: 0.2 10*3/uL — ABNORMAL LOW (ref 0.7–4.0)
MCH: 31.9 pg (ref 26.0–34.0)
MCHC: 34.9 g/dL (ref 30.0–36.0)
MCV: 91.4 fL (ref 80.0–100.0)
Monocytes Absolute: 0.3 10*3/uL (ref 0.1–1.0)
Monocytes Relative: 12 %
Neutro Abs: 1.7 10*3/uL (ref 1.7–7.7)
Neutrophils Relative %: 70 %
Platelet Count: 208 10*3/uL (ref 150–400)
RBC: 3.95 MIL/uL (ref 3.87–5.11)
RDW: 13.8 % (ref 11.5–15.5)
WBC Count: 2.4 10*3/uL — ABNORMAL LOW (ref 4.0–10.5)
nRBC: 0 % (ref 0.0–0.2)

## 2022-04-23 LAB — CMP (CANCER CENTER ONLY)
ALT: 15 U/L (ref 0–44)
AST: 21 U/L (ref 15–41)
Albumin: 3.8 g/dL (ref 3.5–5.0)
Alkaline Phosphatase: 138 U/L — ABNORMAL HIGH (ref 38–126)
Anion gap: 6 (ref 5–15)
BUN: 10 mg/dL (ref 8–23)
CO2: 27 mmol/L (ref 22–32)
Calcium: 8.9 mg/dL (ref 8.9–10.3)
Chloride: 107 mmol/L (ref 98–111)
Creatinine: 0.59 mg/dL (ref 0.44–1.00)
GFR, Estimated: >60 mL/min (ref >60)
Glucose, Bld: 106 mg/dL — ABNORMAL HIGH (ref 70–99)
Potassium: 3.5 mmol/L (ref 3.5–5.1)
Sodium: 140 mmol/L (ref 135–145)
Total Bilirubin: 0.5 mg/dL (ref 0.3–1.2)
Total Protein: 6.2 g/dL — ABNORMAL LOW (ref 6.5–8.1)

## 2022-04-23 LAB — LACTATE DEHYDROGENASE: LDH: 122 U/L (ref 98–192)

## 2022-04-23 MED ORDER — ACETAMINOPHEN 325 MG PO TABS
650.0000 mg | ORAL_TABLET | Freq: Once | ORAL | Status: AC
  Administered 2022-04-23: 650 mg via ORAL
  Filled 2022-04-23: qty 2

## 2022-04-23 MED ORDER — FAMOTIDINE IN NACL 20-0.9 MG/50ML-% IV SOLN
20.0000 mg | Freq: Once | INTRAVENOUS | Status: AC
  Administered 2022-04-23: 20 mg via INTRAVENOUS
  Filled 2022-04-23: qty 50

## 2022-04-23 MED ORDER — METHYLPREDNISOLONE SODIUM SUCC 125 MG IJ SOLR
125.0000 mg | Freq: Every day | INTRAMUSCULAR | Status: DC
  Administered 2022-04-23: 125 mg via INTRAVENOUS
  Filled 2022-04-23: qty 2

## 2022-04-23 MED ORDER — DIPHENHYDRAMINE HCL 25 MG PO CAPS
50.0000 mg | ORAL_CAPSULE | Freq: Once | ORAL | Status: AC
  Administered 2022-04-23: 50 mg via ORAL
  Filled 2022-04-23: qty 2

## 2022-04-23 MED ORDER — SODIUM CHLORIDE 0.9 % IV SOLN: Freq: Once | INTRAVENOUS | Status: AC

## 2022-04-23 MED ORDER — SODIUM CHLORIDE 0.9 % IV SOLN
375.0000 mg/m2 | Freq: Once | INTRAVENOUS | Status: AC
  Administered 2022-04-23: 600 mg via INTRAVENOUS
  Filled 2022-04-23: qty 10

## 2022-04-23 NOTE — Patient Instructions (Signed)
[facility]  Discharge Instructions: Thank you for choosing [facility] to provide your oncology and hematology care.   If you have a lab appointment with the [facility], please go directly to the [facility] and check in at the registration area.   Wear comfortable clothing and clothing appropriate for easy access to any Portacath or PICC line.   We strive to give you quality time with your provider. You may need to reschedule your appointment if you arrive late (15 or more minutes).  Arriving late affects you and other patients whose appointments are after yours.  Also, if you miss three or more appointments without notifying the office, you may be dismissed from the clinic at the provider's discretion.      For prescription refill requests, have your pharmacy contact our office and allow 72 hours for refills to be completed.    Today you received the following chemotherapy and/or immunotherapy agents Rituxan      To help prevent nausea and vomiting after your treatment, we encourage you to take your nausea medication as directed.  BELOW ARE SYMPTOMS THAT SHOULD BE REPORTED IMMEDIATELY: *FEVER GREATER THAN 100.4 F (38 C) OR HIGHER *CHILLS OR SWEATING *NAUSEA AND VOMITING THAT IS NOT CONTROLLED WITH YOUR NAUSEA MEDICATION *UNUSUAL SHORTNESS OF BREATH *UNUSUAL BRUISING OR BLEEDING *URINARY PROBLEMS (pain or burning when urinating, or frequent urination) *BOWEL PROBLEMS (unusual diarrhea, constipation, pain near the anus) TENDERNESS IN MOUTH AND THROAT WITH OR WITHOUT PRESENCE OF ULCERS (sore throat, sores in mouth, or a toothache) UNUSUAL RASH, SWELLING OR PAIN  UNUSUAL VAGINAL DISCHARGE OR ITCHING   Items with * indicate a potential emergency and should be followed up as soon as possible or go to the Emergency Department if any problems should occur.  Please show the CHEMOTHERAPY ALERT CARD or IMMUNOTHERAPY ALERT CARD at check-in to the  Emergency Department and triage nurse.  Should you have questions after your visit or need to cancel or reschedule your appointment, please contact [facility]  Dept: [phone]  and follow the prompts.  Office hours are 8:00 a.m. to 4:30 p.m. Monday - Friday. Please note that voicemails left after 4:00 p.m. may not be returned until the following business day.  We are closed weekends and major holidays. You have access to a nurse at all times for urgent questions. Please call the main number to the clinic Dept: [phone] and follow the prompts.   For any non-urgent questions, you may also contact your provider using MyChart. We now offer e-Visits for anyone 46 and older to request care online for non-urgent symptoms. For details visit [email or web address].   Also download the MyChart app! Go to the app store, search "MyChart", open the app, select [facility], and log in with your MyChart username and password.  Rituximab Injection What is this medication? RITUXIMAB (ri TUX i mab) treats leukemia and lymphoma. It works by blocking a protein that causes cancer cells to grow and multiply. This helps to slow or stop the spread of cancer cells. It may also be used to treat autoimmune conditions, such as arthritis. It works by slowing down an overactive immune system. It is a monoclonal antibody. This medicine may be used for other purposes; ask your health care provider or pharmacist if you have questions. COMMON BRAND NAME(S): RIABNI, Rituxan, RUXIENCE, truxima What should I tell my care team before I take this medication? They need to know if you  have any of these conditions: Chest pain Heart disease Immune system problems Infection, such as chickenpox, cold sores, hepatitis B, herpes Irregular heartbeat or rhythm Kidney disease Low blood counts, such as low white cells, platelets, red cells Lung disease Recent or upcoming vaccine An unusual or allergic  reaction to rituximab, other medications, foods, dyes, or preservatives Pregnant or trying to get pregnant Breast-feeding How should I use this medication? This medication is injected into a vein. It is given by a care team in a hospital or clinic setting. A special MedGuide will be given to you before each treatment. Be sure to read this information carefully each time. Talk to your care team about the use of this medication in children. While this medication may be prescribed for children as young as 6 months for selected conditions, precautions do apply. Overdosage: If you think you have taken too much of this medicine contact a poison control center or emergency room at once. NOTE: This medicine is only for you. Do not share this medicine with others. What if I miss a dose? Keep appointments for follow-up doses. It is important not to miss your dose. Call your care team if you are unable to keep an appointment. What may interact with this medication? Do not take this medication with any of the following: Live vaccines This medication may also interact with the following: Cisplatin This list may not describe all possible interactions. Give your health care provider a list of all the medicines, herbs, non-prescription drugs, or dietary supplements you use. Also tell them if you smoke, drink alcohol, or use illegal drugs. Some items may interact with your medicine. What should I watch for while using this medication? Your condition will be monitored carefully while you are receiving this medication. You may need blood work while taking this medication. This medication can cause serious infusion reactions. To reduce the risk your care team may give you other medications to take before receiving this one. Be sure to follow the directions from your care team. This medication may increase your risk of getting an infection. Call your care team for advice if you get a fever, chills, sore throat, or  other symptoms of a cold or flu. Do not treat yourself. Try to avoid being around people who are sick. Call your care team if you are around anyone with measles, chickenpox, or if you develop sores or blisters that do not heal properly. Avoid taking medications that contain aspirin, acetaminophen, ibuprofen, naproxen, or ketoprofen unless instructed by your care team. These medications may hide a fever. This medication may cause serious skin reactions. They can happen weeks to months after starting the medication. Contact your care team right away if you notice fevers or flu-like symptoms with a rash. The rash may be red or purple and then turn into blisters or peeling of the skin. You may also notice a red rash with swelling of the face, lips, or lymph nodes in your neck or under your arms. In some patients, this medication may cause a serious brain infection that may cause death. If you have any problems seeing, thinking, speaking, walking, or standing, tell your care team right away. If you cannot reach your care team, urgently seek another source of medical care. Talk to your care team if you may be pregnant. Serious birth defects can occur if you take this medication during pregnancy and for 12 months after the last dose. You will need a negative pregnancy test before starting this  medication. Contraception is recommended while taking this medication and for 12 months after the last dose. Your care team can help you find the option that works for you. Do not breastfeed while taking this medication and for at least 6 months after the last dose. What side effects may I notice from receiving this medication? Side effects that you should report to your care team as soon as possible: Allergic reactions or angioedema--skin rash, itching or hives, swelling of the face, eyes, lips, tongue, arms, or legs, trouble swallowing or breathing Bowel blockage--stomach cramping, unable to have a bowel movement or pass  gas, loss of appetite, vomiting Dizziness, loss of balance or coordination, confusion or trouble speaking Heart attack--pain or tightness in the chest, shoulders, arms, or jaw, nausea, shortness of breath, cold or clammy skin, feeling faint or lightheaded Heart rhythm changes--fast or irregular heartbeat, dizziness, feeling faint or lightheaded, chest pain, trouble breathing Infection--fever, chills, cough, sore throat, wounds that don't heal, pain or trouble when passing urine, general feeling of discomfort or being unwell Infusion reactions--chest pain, shortness of breath or trouble breathing, feeling faint or lightheaded Kidney injury--decrease in the amount of urine, swelling of the ankles, hands, or feet Liver injury--right upper belly pain, loss of appetite, nausea, light-colored stool, dark yellow or brown urine, yellowing skin or eyes, unusual weakness or fatigue Redness, blistering, peeling, or loosening of the skin, including inside the mouth Stomach pain that is severe, does not go away, or gets worse Tumor lysis syndrome (TLS)--nausea, vomiting, diarrhea, decrease in the amount of urine, dark urine, unusual weakness or fatigue, confusion, muscle pain or cramps, fast or irregular heartbeat, joint pain Side effects that usually do not require medical attention (report to your care team if they continue or are bothersome): Headache Joint pain Nausea Runny or stuffy nose Unusual weakness or fatigue This list may not describe all possible side effects. Call your doctor for medical advice about side effects. You may report side effects to FDA at 1-800-FDA-1088. Where should I keep my medication? This medication is given in a hospital or clinic. It will not be stored at home. NOTE: This sheet is a summary. It may not cover all possible information. If you have questions about this medicine, talk to your doctor, pharmacist, or health care provider.  2023 Elsevier/Gold Standard (2021-08-26  00:00:00)

## 2022-04-29 ENCOUNTER — Encounter: Payer: Self-pay | Admitting: Hematology

## 2022-04-29 NOTE — Progress Notes (Signed)
[name]   HEMATOLOGY/ONCOLOGY CLINIC VISIT NOTE  Date of Service: .04/23/2022  Patient Care Team: Sueanne Margarita, DO as PCP - General (Internal Medicine) Jodi Marble, MD as Consulting Physician (Otolaryngology) Armandina Gemma, MD as Consulting Physician (General Surgery) Kennith Center, RD as Dietitian (Family Medicine)  CHIEF COMPLAINTS/PURPOSE OF CONSULTATION: Follow-up for continued management of her marginal zone lymphoma and neck cycle of maintenance Rituxan  HISTORY OF PRESENTING ILLNESS:  Please see previous note for details on initial presentation  INTERVAL HISTORY:  Mrs. Tremain is a 67 y.o. female is here for her neck cycle of maintenance Rituxan for stage IV marginal zone lymphoma.  She notes no acute issues with her last Rituxan treatment. No acute new toxicities. Does note that as previously she is having redness in her right eye which previously was thought to be due to dry eye and secondary conjunctival infection.  She has seen ophthalmology for this and has been prescribed 2 antibiotic ointments to be used intermittently but she had not used that recently.  She will start using her neomycin ointment.  She was also recommended to follow-up with her optometrist as soon as possible for additional recommendations.  We discussed consideration of holding Rituxan but the patient is keen to continue this. Labs done today were discussed in detail with the patient.   MEDICAL HISTORY:  Past Medical History:  Diagnosis Date   Cancer ([place]) 2016   marginal zone non-hodgkins lymphoma   Medical history non-contributory     SURGICAL HISTORY: Past Surgical History:  Procedure Laterality Date   ABDOMINAL HYSTERECTOMY     AXILLARY LYMPH NODE BIOPSY Left 06/29/2014   Procedure: AXILLARY LYMPH NODE BIOPSY;  Surgeon: Armandina Gemma, MD;  Location: [facility];  Service: General;  Laterality: Left;   COLONOSCOPY     DIAGNOSTIC LAPAROSCOPY  2005   BSO   MASS EXCISION N/A  02/23/2019   Procedure: Excision of umbilicus skin lesion;  Surgeon: Wallace Going, DO;  Location: [facility];  Service: Plastics;  Laterality: N/A;    SOCIAL HISTORY: Social History   Socioeconomic History   Marital status: Married    Spouse name: Not on file   Number of children: Not on file   Years of education: Not on file   Highest education level: Not on file  Occupational History   Not on file  Tobacco Use   Smoking status: Never   Smokeless tobacco: Never  Substance and Sexual Activity   Alcohol use: No   Drug use: No   Sexual activity: Not on file  Other Topics Concern   Not on file  Social History Narrative   Not on file   Social Determinants of Health   Financial Resource Strain: Not on file  Food Insecurity: Not on file  Transportation Needs: Not on file  Physical Activity: Not on file  Stress: Not on file  Social Connections: Not on file  Intimate Partner Violence: Not on file    FAMILY HISTORY: Family History  Problem Relation Age of Onset   Breast cancer Paternal Aunt     ALLERGIES:  is allergic to chloraprep one step [chlorhexidine gluconate], latex, and tape.  MEDICATIONS:  Current Outpatient Medications  Medication Sig Dispense Refill   acyclovir (ZOVIRAX) 400 MG tablet Take 1 tablet (400 mg total) by mouth daily. 30 tablet 3   ALPRAZolam (XANAX) 0.5 MG tablet      butalbital-acetaminophen-caffeine (FIORICET) 50-325-40 MG tablet Take 1-2 tablets by mouth every 6 (  six) hours as needed for headache. 20 tablet 0   calcium carbonate (OS-CAL) 1250 (500 Ca) MG chewable tablet Chew by mouth.     Cholecalciferol (VITAMIN D3) 5000 UNITS TABS Take by mouth.     dexamethasone (DECADRON) 4 MG tablet Take 2 tablets (8 mg total) by mouth daily. Take 2 tablets daily. Start the day after bendamustine chemotherapy for 2 days. Take with food. 60 tablet 1   ergocalciferol (VITAMIN D2) 1.25 MG (50000 UT) capsule Take 1 capsule (50,000 Units  total) by mouth once a week. 8 capsule 2   erythromycin ophthalmic ointment Place 1 Application into the left eye at bedtime. 3.5 g 0   estradiol (VIVELLE-DOT) 0.05 MG/24HR patch Place 1 patch onto the skin 2 (two) times a week.     ferrous sulfate 325 (65 FE) MG EC tablet Take 1 tablet (325 mg total) by mouth daily with breakfast.  3   glucosamine-chondroitin 500-400 MG tablet Take by mouth.     Multiple Vitamins-Minerals (MULTIVITAMIN WITH MINERALS) tablet Take 1 tablet by mouth daily.     OMEGA-3 FATTY ACIDS-VITAMIN E PO 1,000 capsules. Take 2 capsules by mouth daily.     ondansetron (ZOFRAN) 8 MG tablet Take 1 tablet (8 mg total) by mouth 2 (two) times daily as needed for nausea or vomiting (2 times daily PRN for refractory nausea / vomiting). Start on day 2 after bendamustine chemo. (Patient not taking: Reported on 12/22/2021) 20 tablet 1   prochlorperazine (COMPAZINE) 10 MG tablet Take 1 tablet (10 mg total) by mouth every 6 (six) hours as needed for nausea or vomiting. (Patient not taking: Reported on 12/22/2021) 30 tablet 1   riTUXimab (RITUXAN) 100 MG/10ML injection See admin instructions.     Turmeric 450 MG CAPS as directed Orally     zaleplon (SONATA) 10 MG capsule Take 10 mg by mouth at bedtime.     No current facility-administered medications for this visit.    REVIEW OF SYSTEMS:   10 Point review of Systems was done is negative except as noted above.  PHYSICAL EXAMINATION:. Wt Readings from Last 3 Encounters:  12/22/21 124 lb 9.6 oz (56.5 kg)  11/24/21 121 lb 3.2 oz (55 kg)  10/27/21 127 lb 4 oz (57.7 kg)   There were no vitals filed for this visit. NAD GENERAL:alert, in no acute distress and comfortable SKIN: no acute rashes, no significant lesions EYES: conjunctiva are pink and non-injected, sclera anicteric OROPHARYNX: MMM, no exudates, no oropharyngeal erythema or ulceration NECK: supple, no JVD LYMPH:  no palpable lymphadenopathy in the cervical, axillary or inguinal  regions LUNGS: clear to auscultation b/l with normal respiratory effort HEART: regular rate & rhythm ABDOMEN:  normoactive bowel sounds , non tender, not distended. Extremity: no pedal edema PSYCH: alert & oriented x 3 with fluent speech NEURO: no focal motor/sensory deficits   LABORATORY DATA:  I have reviewed the data as listed  .    Latest Ref Rng & Units 04/23/2022    8:20 AM 02/16/2022    9:10 AM 12/22/2021   10:12 AM  CBC  WBC 4.0 - 10.5 K/uL 2.4  2.8  3.7   Hemoglobin 12.0 - 15.0 g/dL 12.6  12.3  12.2   Hematocrit 36.0 - 46.0 % 36.1  36.3  34.9   Platelets 150 - 400 K/uL 208  219  198   ANC 1700  .    Latest Ref Rng & Units 04/23/2022    8:20 AM 02/16/2022  9:10 AM 12/22/2021   10:12 AM  CMP  Glucose 70 - 99 mg/dL 106  98  97   BUN 8 - 23 mg/dL '10  11  8   '$ Creatinine 0.44 - 1.00 mg/dL 0.59  0.67  0.51   Sodium 135 - 145 mmol/L 140  139  140   Potassium 3.5 - 5.1 mmol/L 3.5  4.0  3.9   Chloride 98 - 111 mmol/L 107  107  109   CO2 22 - 32 mmol/L '27  27  28   '$ Calcium 8.9 - 10.3 mg/dL 8.9  9.2  9.5   Total Protein 6.5 - 8.1 g/dL 6.2  6.4  6.0   Total Bilirubin 0.3 - 1.2 mg/dL 0.5  0.4  0.3   Alkaline Phos 38 - 126 U/L 138  125  136   AST 15 - 41 U/L '21  22  23   '$ ALT 0 - 44 U/L '15  16  17     '$ Lab Results  Component Value Date   LDH 140 02/16/2022     RADIOGRAPHIC STUDIES: I have personally reviewed the radiological images as listed and agreed with the findings in the report. NM PET Image Restag (PS) Skull Base To Thigh  Result Date: 02/07/2022 CLINICAL DATA:  Subsequent treatment strategy for marginal zone lymphoma. EXAM: NUCLEAR MEDICINE PET SKULL BASE TO THIGH TECHNIQUE: 6.19 mCi F-18 FDG was injected intravenously. Full-ring PET imaging was performed from the skull base to thigh after the radiotracer. CT data was obtained and used for attenuation correction and anatomic localization. Fasting blood glucose: 96 mg/dl COMPARISON:  Multiple prior PET CTs.  The most recent is 09/08/2021. Most recent CT examination is 09/08/2021 FINDINGS: Mediastinal blood pool activity: SUV max 1.70 Liver activity: SUV max 3.97 NECK: No hypermetabolic lymph nodes in the neck. Incidental CT findings: None. CHEST: Interval decrease in size of bilateral axillary lymph nodes. 8 mm node near the lateral aspect of the pectoralis minor muscle on image 57/4 previously measured 16 mm. SUV max is 2.48 (Deauville 2) and was previously 1.72. Adjacent node anterior to the teres minor muscle measures 9 mm on image 57/4. This previously measured 14 mm. SUV max is 2.53 (Deauville 2) and was previously 1.76. Right axillary nodes are much smaller. These are all sub 8 mm and measured up to 14 mm on the prior study. Very low level hypermetabolism with SUV max of 1.13 (Deauville 1). This was previously 1.4. No mediastinal or hilar lymphadenopathy. No worrisome pulmonary nodules. No hypermetabolic breast masses. Incidental CT findings: Stable scattered vascular calcifications. ABDOMEN/PELVIS: Much improved abdominal and pelvic lymphadenopathy. Masslike area of retroperitoneal adenopathy seen on the prior study is no longer present. An index left-sided retroperitoneal node measures a maximum of 17 mm on image 118/4 and previously measured 27 mm. SUV max is 1.82 and was previously 2.19. Left lateral external iliac node measures 7 mm on image 167/4 and previously measured 22 mm. SUV max is 1.80 and was previously 2.14 (Deauville 2). Left inguinal node measures 9 mm on image 181/4 and previously measured 17 mm. SUV max is 1.59 (Deauville 1) and was previously 2.10. The solid abdominal organs are unremarkable. No splenic hypermetabolism or splenic lesions. Incidental CT findings: Stable scattered vascular calcifications. SKELETON: No hypermetabolic bone disease is identified. Stable aggressive appearing pattern of osteoporosis as seen previously. Incidental CT findings: None. IMPRESSION: 1. Significant interval  decrease in size of adenopathy involving the chest, abdomen and pelvis as detailed above. Persistent  low level hypermetabolism. Deauville 1 and Deauville 2 as above. Findings suggest a good response to treatment. 2. No new or progressive areas of disease. 3. Persistent aggressive pattern osteoporosis but no hypermetabolic bone disease is identified. Electronically Signed   By: Marijo Sanes M.D.   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 Initially diagnosed in 2016. Patient was diagnosed with nodal marginal zone lymphoma based on lymph node biopsy of her left axillary lymph node on 06/29/2014 and has been following with Dr. Jana Hakim for active surveillance and until recently did not meet criteria for consideration of treatment. In December 2019 on 04/12/2018 the patient had a biopsy of a right breast mass that showed pathology consistent with marginal zone lymphoma.  Patient over the last year or so has had increasing WBC counts which were as high as 91.2k in December 2022.  Patient also noted to have some anemia with hemoglobin of 11 and normal platelet counts of 234k in January 2023.  She also started developing some lymphadenopathy in the neck.  Patient is status post 4 cycles of Bendamustine Rituxan and is on maintenance Rituxan at this time every 2 months  PET CT scan done 09/08/2021 revealed "1. Bulky axillary adenopathy periaortic adenopathy and iliac adenopathy with very low metabolic activity ( Deauville 2). Findings consistent with low-grade lymphoma. 2. Normal spleen. 3. Diffuse permeative pattern within the pelvis and spine. No hypermetabolic skeletal lesions."  #2 status post extensive bone involvement from marginal zone lymphoma #3 headaches without any red flags likely migrainous versus tension headaches.  Now much better controlled.  Plan -Patient's labs done today were discussed in detail with  her CBC shows lymphopenia but no neutropenia and normal hemoglobin and platelets -Patient noted to have conjunctival inflammation of the right eye possibly due to dry eyes plus possible early infection.  She was recommended to restart using her topical ocular antibiotics as prescribed by her optometrist and follow-up with them for continued management of this. -Patient is DEXA scan from 04/07/2022 was discussed which shows osteopenia.  Vitamin D replacement and activity level. -Patient prefers to continue her maintenance Rituxan.  Orders reviewed and signed.   Follow-up: Per integrated scheduling Patient called to follow-up with Dr. Selmer Dominion for further evaluation and management of her conjunctival inflammation in the right eye  The total time spent in the appointment was 30 minutes*.  All of the patient's questions were answered with apparent satisfaction. The patient knows to call the clinic with any problems, questions or concerns.   Sullivan Lone MD MS AAHIVMS [facility] Hematology/Oncology Physician [facility]  .*Total Encounter Time as defined by the Centers for Medicare and Medicaid Services includes, in addition to the face-to-face time of a patient visit (documented in the note above) non-face-to-face time: obtaining and reviewing outside history, ordering and reviewing medications, tests or procedures, care coordination (communications with other health care professionals or caregivers) and documentation in the medical record.

## 2022-05-26 ENCOUNTER — Ambulatory Visit: Payer: Medicare Other

## 2022-05-28 ENCOUNTER — Ambulatory Visit
Admission: RE | Admit: 2022-05-28 | Discharge: 2022-05-28 | Disposition: A | Discharge status: Home / Self Care | Payer: Medicare Other | Source: Ambulatory Visit | Attending: Obstetrics and Gynecology | Admitting: Obstetrics and Gynecology

## 2022-05-28 DIAGNOSIS — Z1231 Encounter for screening mammogram for malignant neoplasm of breast: Secondary | ICD-10-CM

## 2022-06-10 ENCOUNTER — Ambulatory Visit: Payer: Medicare Other

## 2022-06-12 ENCOUNTER — Inpatient Hospital Stay (HOSPITAL_BASED_OUTPATIENT_CLINIC_OR_DEPARTMENT_OTHER): Payer: Medicare Other | Admitting: Hematology

## 2022-06-12 ENCOUNTER — Inpatient Hospital Stay: Payer: Medicare Other

## 2022-06-12 ENCOUNTER — Other Ambulatory Visit: Payer: Self-pay

## 2022-06-12 ENCOUNTER — Inpatient Hospital Stay: Payer: Medicare Other | Attending: Oncology

## 2022-06-12 VITALS — BP 136/71 | HR 59 | Temp 97.3°F | Resp 20 | Wt 129.5 lb

## 2022-06-12 VITALS — BP 133/68 | HR 72 | Temp 98.3°F | Resp 18

## 2022-06-12 DIAGNOSIS — Z7189 Other specified counseling: Secondary | ICD-10-CM | POA: Diagnosis not present

## 2022-06-12 DIAGNOSIS — H04129 Dry eye syndrome of unspecified lacrimal gland: Secondary | ICD-10-CM | POA: Insufficient documentation

## 2022-06-12 DIAGNOSIS — C8580 Other specified types of non-Hodgkin lymphoma, unspecified site: Secondary | ICD-10-CM

## 2022-06-12 DIAGNOSIS — M8580 Other specified disorders of bone density and structure, unspecified site: Secondary | ICD-10-CM | POA: Insufficient documentation

## 2022-06-12 DIAGNOSIS — Z803 Family history of malignant neoplasm of breast: Secondary | ICD-10-CM | POA: Diagnosis not present

## 2022-06-12 DIAGNOSIS — Z5112 Encounter for antineoplastic immunotherapy: Secondary | ICD-10-CM | POA: Insufficient documentation

## 2022-06-12 LAB — LACTATE DEHYDROGENASE: LDH: 140 U/L (ref 98–192)

## 2022-06-12 LAB — CMP (CANCER CENTER ONLY)
ALT: 18 U/L (ref 0–44)
AST: 21 U/L (ref 15–41)
Albumin: 3.9 g/dL (ref 3.5–5.0)
Alkaline Phosphatase: 113 U/L (ref 38–126)
Anion gap: 5 (ref 5–15)
BUN: 11 mg/dL (ref 8–23)
CO2: 28 mmol/L (ref 22–32)
Calcium: 9.6 mg/dL (ref 8.9–10.3)
Chloride: 106 mmol/L (ref 98–111)
Creatinine: 0.63 mg/dL (ref 0.44–1.00)
GFR, Estimated: >60 mL/min (ref >60)
Glucose, Bld: 99 mg/dL (ref 70–99)
Potassium: 3.8 mmol/L (ref 3.5–5.1)
Sodium: 139 mmol/L (ref 135–145)
Total Bilirubin: 0.4 mg/dL (ref 0.3–1.2)
Total Protein: 6.1 g/dL — ABNORMAL LOW (ref 6.5–8.1)

## 2022-06-12 LAB — CBC WITH DIFFERENTIAL (CANCER CENTER ONLY)
Abs Immature Granulocytes: 0 10*3/uL (ref 0.00–0.07)
Basophils Absolute: 0.1 10*3/uL (ref 0.0–0.1)
Basophils Relative: 2 %
Eosinophils Absolute: 0.1 10*3/uL (ref 0.0–0.5)
Eosinophils Relative: 4 %
HCT: 38.1 % (ref 36.0–46.0)
Hemoglobin: 13.4 g/dL (ref 12.0–15.0)
Immature Granulocytes: 0 %
Lymphocytes Relative: 10 %
Lymphs Abs: 0.3 10*3/uL — ABNORMAL LOW (ref 0.7–4.0)
MCH: 32.3 pg (ref 26.0–34.0)
MCHC: 35.2 g/dL (ref 30.0–36.0)
MCV: 91.8 fL (ref 80.0–100.0)
Monocytes Absolute: 0.3 10*3/uL (ref 0.1–1.0)
Monocytes Relative: 12 %
Neutro Abs: 1.8 10*3/uL (ref 1.7–7.7)
Neutrophils Relative %: 72 %
Platelet Count: 220 10*3/uL (ref 150–400)
RBC: 4.15 MIL/uL (ref 3.87–5.11)
RDW: 13.7 % (ref 11.5–15.5)
WBC Count: 2.5 10*3/uL — ABNORMAL LOW (ref 4.0–10.5)
nRBC: 0 % (ref 0.0–0.2)

## 2022-06-12 MED ORDER — SODIUM CHLORIDE 0.9 % IV SOLN
375.0000 mg/m2 | Freq: Once | INTRAVENOUS | Status: AC
  Administered 2022-06-12: 600 mg via INTRAVENOUS
  Filled 2022-06-12: qty 50

## 2022-06-12 MED ORDER — SODIUM CHLORIDE 0.9 % IV SOLN: Freq: Once | INTRAVENOUS | Status: AC

## 2022-06-12 MED ORDER — DIPHENHYDRAMINE HCL 25 MG PO CAPS
50.0000 mg | ORAL_CAPSULE | Freq: Once | ORAL | Status: AC
  Administered 2022-06-12: 50 mg via ORAL
  Filled 2022-06-12: qty 2

## 2022-06-12 MED ORDER — FAMOTIDINE IN NACL 20-0.9 MG/50ML-% IV SOLN
20.0000 mg | Freq: Once | INTRAVENOUS | Status: AC
  Administered 2022-06-12: 20 mg via INTRAVENOUS
  Filled 2022-06-12: qty 50

## 2022-06-12 MED ORDER — ACETAMINOPHEN 325 MG PO TABS
650.0000 mg | ORAL_TABLET | Freq: Once | ORAL | Status: AC
  Administered 2022-06-12: 650 mg via ORAL
  Filled 2022-06-12: qty 2

## 2022-06-12 MED ORDER — METHYLPREDNISOLONE SODIUM SUCC 125 MG IJ SOLR
125.0000 mg | Freq: Every day | INTRAMUSCULAR | Status: DC
  Administered 2022-06-12: 125 mg via INTRAVENOUS
  Filled 2022-06-12: qty 2

## 2022-06-12 NOTE — Patient Instructions (Signed)
[facility]  Discharge Instructions: Thank you for choosing [facility] to provide your oncology and hematology care.   If you have a lab appointment with the [facility], please go directly to the [facility] and check in at the registration area.   Wear comfortable clothing and clothing appropriate for easy access to any Portacath or PICC line.   We strive to give you quality time with your provider. You may need to reschedule your appointment if you arrive late (15 or more minutes).  Arriving late affects you and other patients whose appointments are after yours.  Also, if you miss three or more appointments without notifying the office, you may be dismissed from the clinic at the provider's discretion.      For prescription refill requests, have your pharmacy contact our office and allow 72 hours for refills to be completed.    Today you received the following chemotherapy and/or immunotherapy agents: rituximab (ruxience)      To help prevent nausea and vomiting after your treatment, we encourage you to take your nausea medication as directed.  BELOW ARE SYMPTOMS THAT SHOULD BE REPORTED IMMEDIATELY: *FEVER GREATER THAN 100.4 F (38 C) OR HIGHER *CHILLS OR SWEATING *NAUSEA AND VOMITING THAT IS NOT CONTROLLED WITH YOUR NAUSEA MEDICATION *UNUSUAL SHORTNESS OF BREATH *UNUSUAL BRUISING OR BLEEDING *URINARY PROBLEMS (pain or burning when urinating, or frequent urination) *BOWEL PROBLEMS (unusual diarrhea, constipation, pain near the anus) TENDERNESS IN MOUTH AND THROAT WITH OR WITHOUT PRESENCE OF ULCERS (sore throat, sores in mouth, or a toothache) UNUSUAL RASH, SWELLING OR PAIN  UNUSUAL VAGINAL DISCHARGE OR ITCHING   Items with * indicate a potential emergency and should be followed up as soon as possible or go to the Emergency Department if any problems should occur.  Please show the CHEMOTHERAPY ALERT CARD or IMMUNOTHERAPY ALERT  CARD at check-in to the Emergency Department and triage nurse.  Should you have questions after your visit or need to cancel or reschedule your appointment, please contact [facility]  Dept: [phone]  and follow the prompts.  Office hours are 8:00 a.m. to 4:30 p.m. Monday - Friday. Please note that voicemails left after 4:00 p.m. may not be returned until the following business day.  We are closed weekends and major holidays. You have access to a nurse at all times for urgent questions. Please call the main number to the clinic Dept: [phone] and follow the prompts.   For any non-urgent questions, you may also contact your provider using MyChart. We now offer e-Visits for anyone 87 and older to request care online for non-urgent symptoms. For details visit [email or web address].   Also download the MyChart app! Go to the app store, search "MyChart", open the app, select [facility], and log in with your MyChart username and password.

## 2022-06-12 NOTE — Progress Notes (Signed)
Patient seen by MD today  Vitals are within treatment parameters.  Labs reviewed: and are within treatment parameters.  Per physician team, patient is ready for treatment and there are NO modifications to the treatment plan.

## 2022-06-12 NOTE — Progress Notes (Signed)
[name]   HEMATOLOGY/ONCOLOGY CLINIC VISIT NOTE  Date of Service: 06/12/22   Patient Care Team: Sueanne Margarita, DO as PCP - General (Internal Medicine) Jodi Marble, MD as Consulting Physician (Otolaryngology) Armandina Gemma, MD as Consulting Physician (General Surgery) Kennith Center, RD as Dietitian (Family Medicine)  CHIEF COMPLAINTS/PURPOSE OF CONSULTATION: Follow-up for continued management of her marginal zone lymphoma and next cycle of maintenance Rituxan  HISTORY OF PRESENTING ILLNESS:  Please see previous note for details on initial presentation  INTERVAL HISTORY:  [name] is a 67 y.o. female is here for her neck cycle of maintenance Rituxan for stage IV marginal zone lymphoma.    Patient was last seen by me on 04/23/22 and reported redness in her right eye. Otherwise, she was doing well overall.   Today, she will be receiving day 1 cycle 3 of her Rituxan maintenance treatment. She reports that she has severe chronic dry eye syndrome and wears warm masks regularly to increase circulation. She also uses an ointment during the night . She denies recent flares and reports that antibiotic drops have helped with her infection.   She denies any fever, chills, new bone pain, lumps/bumps, back pain,  She does endorse stable right neck pain, and adds that acupuncture has helped. She is UTD with her vaccinations, including RSV, pneumonia, shingles, COVID-19 booster, and influenza.  MEDICAL HISTORY:  Past Medical History:  Diagnosis Date   Cancer ([place]) 2016   marginal zone non-hodgkins lymphoma   Medical history non-contributory     SURGICAL HISTORY: Past Surgical History:  Procedure Laterality Date   ABDOMINAL HYSTERECTOMY     AXILLARY LYMPH NODE BIOPSY Left 06/29/2014   Procedure: AXILLARY LYMPH NODE BIOPSY;  Surgeon: Armandina Gemma, MD;  Location: [facility];  Service: General;  Laterality: Left;   COLONOSCOPY     DIAGNOSTIC LAPAROSCOPY  2005   BSO   MASS  EXCISION N/A 02/23/2019   Procedure: Excision of umbilicus skin lesion;  Surgeon: Wallace Going, DO;  Location: [place] [facility];  Service: Plastics;  Laterality: N/A;    SOCIAL HISTORY: Social History   Socioeconomic History   Marital status: Married    Spouse name: Not on file   Number of children: Not on file   Years of education: Not on file   Highest education level: Not on file  Occupational History   Not on file  Tobacco Use   Smoking status: Never   Smokeless tobacco: Never  Substance and Sexual Activity   Alcohol use: No   Drug use: No   Sexual activity: Not on file  Other Topics Concern   Not on file  Social History Narrative   Not on file   Social Determinants of Health   Financial Resource Strain: Not on file  Food Insecurity: Not on file  Transportation Needs: Not on file  Physical Activity: Not on file  Stress: Not on file  Social Connections: Not on file  Intimate Partner Violence: Not on file    FAMILY HISTORY: Family History  Problem Relation Age of Onset   Breast cancer Paternal Aunt     ALLERGIES:  is allergic to chloraprep one step [chlorhexidine gluconate], latex, and tape.  MEDICATIONS:  Current Outpatient Medications  Medication Sig Dispense Refill   acyclovir (ZOVIRAX) 400 MG tablet Take 1 tablet (400 mg total) by mouth daily. 30 tablet 3   ALPRAZolam (XANAX) 0.5 MG tablet      butalbital-acetaminophen-caffeine (FIORICET) 50-325-40 MG tablet Take 1-2  tablets by mouth every 6 (six) hours as needed for headache. 20 tablet 0   calcium carbonate (OS-CAL) 1250 (500 Ca) MG chewable tablet Chew by mouth.     Cholecalciferol (VITAMIN D3) 5000 UNITS TABS Take by mouth.     dexamethasone (DECADRON) 4 MG tablet Take 2 tablets (8 mg total) by mouth daily. Take 2 tablets daily. Start the day after bendamustine chemotherapy for 2 days. Take with food. 60 tablet 1   erythromycin ophthalmic ointment Place 1 Application into the left eye  at bedtime. 3.5 g 0   estradiol (VIVELLE-DOT) 0.05 MG/24HR patch Place 1 patch onto the skin 2 (two) times a week.     ferrous sulfate 325 (65 FE) MG EC tablet Take 1 tablet (325 mg total) by mouth daily with breakfast.  3   glucosamine-chondroitin 500-400 MG tablet Take by mouth.     Multiple Vitamins-Minerals (MULTIVITAMIN WITH MINERALS) tablet Take 1 tablet by mouth daily.     OMEGA-3 FATTY ACIDS-VITAMIN E PO 1,000 capsules. Take 2 capsules by mouth daily.     ondansetron (ZOFRAN) 8 MG tablet Take 1 tablet (8 mg total) by mouth 2 (two) times daily as needed for nausea or vomiting (2 times daily PRN for refractory nausea / vomiting). Start on day 2 after bendamustine chemo. (Patient not taking: Reported on 12/22/2021) 20 tablet 1   prochlorperazine (COMPAZINE) 10 MG tablet Take 1 tablet (10 mg total) by mouth every 6 (six) hours as needed for nausea or vomiting. (Patient not taking: Reported on 12/22/2021) 30 tablet 1   riTUXimab (RITUXAN) 100 MG/10ML injection See admin instructions.     Turmeric 450 MG CAPS as directed Orally     Vitamin D, Ergocalciferol, (DRISDOL) 1.25 MG (50000 UNIT) CAPS capsule TAKE 1 CAPSULE BY MOUTH ONE TIME PER WEEK 8 capsule 2   zaleplon (SONATA) 10 MG capsule Take 10 mg by mouth at bedtime.     No current facility-administered medications for this visit.    REVIEW OF SYSTEMS:    10 Point review of Systems was done is negative except as noted above.  PHYSICAL EXAMINATION:. Wt Readings from Last 3 Encounters:  04/23/22 134 lb 3.2 oz (60.9 kg)  02/16/22 127 lb 12 oz (57.9 kg)  12/22/21 124 lb 9.6 oz (56.5 kg)   Vitals:   06/12/22 0941  BP: 136/71  Pulse: (!) 59  Resp: 20  Temp: (!) 97.3 F (36.3 C)  SpO2: 99%   GENERAL:alert, in no acute distress and comfortable SKIN: no acute rashes, no significant lesions EYES: conjunctiva are pink and non-injected, sclera anicteric OROPHARYNX: MMM, no exudates, no oropharyngeal erythema or ulceration NECK: supple,  no JVD LYMPH:  no palpable lymphadenopathy in the cervical, axillary or inguinal regions LUNGS: clear to auscultation b/l with normal respiratory effort HEART: regular rate & rhythm ABDOMEN:  normoactive bowel sounds , non tender, not distended. Extremity: no pedal edema PSYCH: alert & oriented x 3 with fluent speech NEURO: no focal motor/sensory deficits   LABORATORY DATA:  I have reviewed the data as listed  .    Latest Ref Rng & Units 06/12/2022    9:16 AM 04/23/2022    8:20 AM 02/16/2022    9:10 AM  CBC  WBC 4.0 - 10.5 K/uL 2.5  2.4  2.8   Hemoglobin 12.0 - 15.0 g/dL 13.4  12.6  12.3   Hematocrit 36.0 - 46.0 % 38.1  36.1  36.3   Platelets 150 - 400 K/uL 220  208  219   [place] 1700  .    Latest Ref Rng & Units 06/12/2022    9:16 AM 04/23/2022    8:20 AM 02/16/2022    9:10 AM  CMP  Glucose 70 - 99 mg/dL 99  106  98   BUN 8 - 23 mg/dL 11  10  11   $ Creatinine 0.44 - 1.00 mg/dL 0.63  0.59  0.67   Sodium 135 - 145 mmol/L 139  140  139   Potassium 3.5 - 5.1 mmol/L 3.8  3.5  4.0   Chloride 98 - 111 mmol/L 106  107  107   CO2 22 - 32 mmol/L 28  27  27   $ Calcium 8.9 - 10.3 mg/dL 9.6  8.9  9.2   Total Protein 6.5 - 8.1 g/dL 6.1  6.2  6.4   Total Bilirubin 0.3 - 1.2 mg/dL 0.4  0.5  0.4   Alkaline Phos 38 - 126 U/L 113  138  125   AST 15 - 41 U/L 21  21  22   $ ALT 0 - 44 U/L 18  15  16     $ Lab Results  Component Value Date   LDH 140 06/12/2022     RADIOGRAPHIC STUDIES: I have personally reviewed the radiological images as listed and agreed with the findings in the report. MM 3D SCREEN BREAST BILATERAL  Result Date: 06/01/2022 CLINICAL DATA:  Screening. EXAM: DIGITAL SCREENING BILATERAL MAMMOGRAM WITH TOMOSYNTHESIS AND CAD TECHNIQUE: Bilateral screening digital craniocaudal and mediolateral oblique mammograms were obtained. Bilateral screening digital breast tomosynthesis was performed. The images were evaluated with computer-aided detection. COMPARISON:  Previous exam(s).  ACR Breast Density Category b: There are scattered areas of fibroglandular density. FINDINGS: There are no findings suspicious for malignancy. IMPRESSION: No mammographic evidence of malignancy. A result letter of this screening mammogram will be mailed directly to the patient. RECOMMENDATION: Screening mammogram in one year. (Code:SM-B-01Y) BI-RADS CATEGORY  1: Negative. Electronically Signed   By: Margarette Canada M.D.   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 Initially diagnosed in 2016. Patient was diagnosed with nodal marginal zone lymphoma based on lymph node biopsy of her left axillary lymph node on 06/29/2014 and has been following with Dr. Jana Hakim for active surveillance and until recently did not meet criteria for consideration of treatment. In December 2019 on 04/12/2018 the patient had a biopsy of a right breast mass that showed pathology consistent with marginal zone lymphoma.  Patient over the last year or so has had increasing WBC counts which were as high as 91.2k in December 2022.  Patient also noted to have some anemia with hemoglobin of 11 and normal platelet counts of 234k in January 2023.  She also started developing some lymphadenopathy in the neck.  Patient is status post 4 cycles of Bendamustine Rituxan and is on maintenance Rituxan at this time every 2 months  PET CT scan done 09/08/2021 revealed "1. Bulky axillary adenopathy periaortic adenopathy and iliac adenopathy with very low metabolic activity ( [place] 2). Findings consistent with low-grade lymphoma. 2. Normal spleen. 3. Diffuse permeative pattern within the pelvis and spine. No hypermetabolic skeletal lesions."  #2 status post extensive bone involvement from marginal zone lymphoma #3 headaches without any red flags likely migrainous versus tension headaches.  Now much better controlled. #4 S/p Infectious conjunctivitis - related  to chronic severe dry eyes. Being mx by  her ophthalmologist. Plan:  -Discussed lab results on 06/12/22  with patient. CBC stable, showed WBC of 2.5 K, hemoglobin of 13.4, and platelets of 220 K.  -CBC reveals mild lymphopenia, no neutropenia, normal platelet count -recommended specific yoga poses 5x a day to improve stable neck pain -Continue with maintenance Rituxan every 2 months  Follow-up: Per integrated schedule for next 2 doses of maintenance Rituxan  The total time spent in the appointment was 21 minutes* .  All of the patient's questions were answered with apparent satisfaction. The patient knows to call the clinic with any problems, questions or concerns.   Sullivan Lone MD MS AAHIVMS [facility] Hematology/Oncology Physician [facility]  .*Total Encounter Time as defined by the Centers for Medicare and Medicaid Services includes, in addition to the face-to-face time of a patient visit (documented in the note above) non-face-to-face time: obtaining and reviewing outside history, ordering and reviewing medications, tests or procedures, care coordination (communications with other health care professionals or caregivers) and documentation in the medical record.   I,Mitra Faeizi,acting as a Education administrator for Sullivan Lone, MD.,have documented all relevant documentation on the behalf of Sullivan Lone, MD,as directed by  Sullivan Lone, MD while in the presence of Sullivan Lone, MD.  .I have reviewed the above documentation for accuracy and completeness, and I agree with the above. Brunetta Genera MD

## 2022-06-17 ENCOUNTER — Other Ambulatory Visit: Payer: Self-pay

## 2022-06-18 ENCOUNTER — Encounter: Payer: Self-pay | Admitting: Hematology

## 2022-06-19 ENCOUNTER — Ambulatory Visit: Payer: Medicare Other | Admitting: Hematology

## 2022-06-19 ENCOUNTER — Ambulatory Visit: Payer: Medicare Other

## 2022-06-19 ENCOUNTER — Other Ambulatory Visit: Payer: Medicare Other

## 2022-06-25 ENCOUNTER — Other Ambulatory Visit: Payer: Self-pay

## 2022-07-14 ENCOUNTER — Telehealth: Payer: Self-pay | Admitting: Hematology

## 2022-07-14 NOTE — Telephone Encounter (Signed)
Called patient per 3/19 IB message to answer questions about 4/19 appointments. Patient's questions answered.

## 2022-07-16 ENCOUNTER — Ambulatory Visit: Payer: 59 | Admitting: Hematology and Oncology

## 2022-07-16 ENCOUNTER — Ambulatory Visit: Payer: 59

## 2022-07-16 ENCOUNTER — Other Ambulatory Visit: Payer: 59

## 2022-08-09 ENCOUNTER — Other Ambulatory Visit: Payer: Self-pay

## 2022-08-13 ENCOUNTER — Other Ambulatory Visit: Payer: Self-pay

## 2022-08-13 DIAGNOSIS — C8580 Other specified types of non-Hodgkin lymphoma, unspecified site: Secondary | ICD-10-CM

## 2022-08-14 ENCOUNTER — Other Ambulatory Visit: Payer: Self-pay

## 2022-08-14 ENCOUNTER — Inpatient Hospital Stay: Payer: Medicare Other

## 2022-08-14 ENCOUNTER — Inpatient Hospital Stay: Payer: Medicare Other | Attending: Oncology

## 2022-08-14 ENCOUNTER — Inpatient Hospital Stay (HOSPITAL_BASED_OUTPATIENT_CLINIC_OR_DEPARTMENT_OTHER): Payer: Medicare Other | Admitting: Hematology

## 2022-08-14 VITALS — BP 139/79 | HR 64 | Resp 18

## 2022-08-14 VITALS — BP 133/66 | HR 58 | Temp 97.7°F | Resp 16 | Wt 124.3 lb

## 2022-08-14 DIAGNOSIS — C8580 Other specified types of non-Hodgkin lymphoma, unspecified site: Secondary | ICD-10-CM

## 2022-08-14 DIAGNOSIS — Z7189 Other specified counseling: Secondary | ICD-10-CM

## 2022-08-14 DIAGNOSIS — Z803 Family history of malignant neoplasm of breast: Secondary | ICD-10-CM | POA: Diagnosis not present

## 2022-08-14 DIAGNOSIS — D72819 Decreased white blood cell count, unspecified: Secondary | ICD-10-CM | POA: Insufficient documentation

## 2022-08-14 DIAGNOSIS — Z5112 Encounter for antineoplastic immunotherapy: Secondary | ICD-10-CM | POA: Diagnosis present

## 2022-08-14 DIAGNOSIS — H04129 Dry eye syndrome of unspecified lacrimal gland: Secondary | ICD-10-CM | POA: Insufficient documentation

## 2022-08-14 LAB — CBC WITH DIFFERENTIAL (CANCER CENTER ONLY)
Abs Immature Granulocytes: 0.01 10*3/uL (ref 0.00–0.07)
Basophils Absolute: 0 10*3/uL (ref 0.0–0.1)
Basophils Relative: 2 %
Eosinophils Absolute: 0.1 10*3/uL (ref 0.0–0.5)
Eosinophils Relative: 5 %
HCT: 38.3 % (ref 36.0–46.0)
Hemoglobin: 13 g/dL (ref 12.0–15.0)
Immature Granulocytes: 0 %
Lymphocytes Relative: 14 %
Lymphs Abs: 0.3 10*3/uL — ABNORMAL LOW (ref 0.7–4.0)
MCH: 31.6 pg (ref 26.0–34.0)
MCHC: 33.9 g/dL (ref 30.0–36.0)
MCV: 93.2 fL (ref 80.0–100.0)
Monocytes Absolute: 0.3 10*3/uL (ref 0.1–1.0)
Monocytes Relative: 13 %
Neutro Abs: 1.6 10*3/uL — ABNORMAL LOW (ref 1.7–7.7)
Neutrophils Relative %: 66 %
Platelet Count: 225 10*3/uL (ref 150–400)
RBC: 4.11 MIL/uL (ref 3.87–5.11)
RDW: 13.9 % (ref 11.5–15.5)
WBC Count: 2.4 10*3/uL — ABNORMAL LOW (ref 4.0–10.5)
nRBC: 0 % (ref 0.0–0.2)

## 2022-08-14 LAB — CMP (CANCER CENTER ONLY)
ALT: 23 U/L (ref 0–44)
AST: 24 U/L (ref 15–41)
Albumin: 4.1 g/dL (ref 3.5–5.0)
Alkaline Phosphatase: 110 U/L (ref 38–126)
Anion gap: 3 — ABNORMAL LOW (ref 5–15)
BUN: 8 mg/dL (ref 8–23)
CO2: 31 mmol/L (ref 22–32)
Calcium: 9.7 mg/dL (ref 8.9–10.3)
Chloride: 103 mmol/L (ref 98–111)
Creatinine: 0.7 mg/dL (ref 0.44–1.00)
GFR, Estimated: >60 mL/min (ref >60)
Glucose, Bld: 87 mg/dL (ref 70–99)
Potassium: 4 mmol/L (ref 3.5–5.1)
Sodium: 137 mmol/L (ref 135–145)
Total Bilirubin: 0.6 mg/dL (ref 0.3–1.2)
Total Protein: 6.3 g/dL — ABNORMAL LOW (ref 6.5–8.1)

## 2022-08-14 LAB — LACTATE DEHYDROGENASE: LDH: 117 U/L (ref 98–192)

## 2022-08-14 MED ORDER — METHYLPREDNISOLONE SODIUM SUCC 125 MG IJ SOLR
125.0000 mg | Freq: Every day | INTRAMUSCULAR | Status: DC
  Administered 2022-08-14: 125 mg via INTRAVENOUS
  Filled 2022-08-14: qty 2

## 2022-08-14 MED ORDER — SODIUM CHLORIDE 0.9 % IV SOLN
375.0000 mg/m2 | Freq: Once | INTRAVENOUS | Status: AC
  Administered 2022-08-14: 600 mg via INTRAVENOUS
  Filled 2022-08-14: qty 10

## 2022-08-14 MED ORDER — ACETAMINOPHEN 325 MG PO TABS
650.0000 mg | ORAL_TABLET | Freq: Once | ORAL | Status: AC
  Administered 2022-08-14: 650 mg via ORAL
  Filled 2022-08-14: qty 2

## 2022-08-14 MED ORDER — DIPHENHYDRAMINE HCL 25 MG PO CAPS
50.0000 mg | ORAL_CAPSULE | Freq: Once | ORAL | Status: AC
  Administered 2022-08-14: 50 mg via ORAL
  Filled 2022-08-14: qty 2

## 2022-08-14 MED ORDER — FAMOTIDINE IN NACL 20-0.9 MG/50ML-% IV SOLN
20.0000 mg | Freq: Once | INTRAVENOUS | Status: AC
  Administered 2022-08-14: 20 mg via INTRAVENOUS
  Filled 2022-08-14: qty 50

## 2022-08-14 MED ORDER — SODIUM CHLORIDE 0.9 % IV SOLN: Freq: Once | INTRAVENOUS | Status: AC

## 2022-08-14 NOTE — Patient Instructions (Signed)
[facility]  Discharge Instructions: Thank you for choosing [facility] to provide your oncology and hematology care.   If you have a lab appointment with the Cancer Center, please go directly to the Cancer Center and check in at the registration area.   Wear comfortable clothing and clothing appropriate for easy access to any Portacath or PICC line.   We strive to give you quality time with your provider. You may need to reschedule your appointment if you arrive late (15 or more minutes).  Arriving late affects you and other patients whose appointments are after yours.  Also, if you miss three or more appointments without notifying the office, you may be dismissed from the clinic at the provider's discretion.      For prescription refill requests, have your pharmacy contact our office and allow 72 hours for refills to be completed.    Today you received the following chemotherapy and/or immunotherapy agents: Rituximab.       To help prevent nausea and vomiting after your treatment, we encourage you to take your nausea medication as directed.  BELOW ARE SYMPTOMS THAT SHOULD BE REPORTED IMMEDIATELY: *FEVER GREATER THAN 100.4 F (38 C) OR HIGHER *CHILLS OR SWEATING *NAUSEA AND VOMITING THAT IS NOT CONTROLLED WITH YOUR NAUSEA MEDICATION *UNUSUAL SHORTNESS OF BREATH *UNUSUAL BRUISING OR BLEEDING *URINARY PROBLEMS (pain or burning when urinating, or frequent urination) *BOWEL PROBLEMS (unusual diarrhea, constipation, pain near the anus) TENDERNESS IN MOUTH AND THROAT WITH OR WITHOUT PRESENCE OF ULCERS (sore throat, sores in mouth, or a toothache) UNUSUAL RASH, SWELLING OR PAIN  UNUSUAL VAGINAL DISCHARGE OR ITCHING   Items with * indicate a potential emergency and should be followed up as soon as possible or go to the Emergency Department if any problems should occur.  Please show the CHEMOTHERAPY ALERT CARD or IMMUNOTHERAPY ALERT CARD at  check-in to the Emergency Department and triage nurse.  Should you have questions after your visit or need to cancel or reschedule your appointment, please contact [facility]  Dept: [phone]  and follow the prompts.  Office hours are 8:00 a.m. to 4:30 p.m. Monday - Friday. Please note that voicemails left after 4:00 p.m. may not be returned until the following business day.  We are closed weekends and major holidays. You have access to a nurse at all times for urgent questions. Please call the main number to the clinic Dept: [phone] and follow the prompts.   For any non-urgent questions, you may also contact your provider using MyChart. We now offer e-Visits for anyone 18 and older to request care online for non-urgent symptoms. For details visit [email or web address].   Also download the MyChart app! Go to the app store, search "MyChart", open the app, select [facility], and log in with your MyChart username and password.   

## 2022-08-14 NOTE — Progress Notes (Signed)
HEMATOLOGY/ONCOLOGY CLINIC VISIT NOTE  Date of Service: 08/14/22   Patient Care Team: [name], DO as PCP - General (Internal Medicine) Flo Shanks, MD as Consulting Physician (Otolaryngology) [name], MD as Consulting Physician (General Surgery) [name], RD as Dietitian (Family Medicine)  CHIEF COMPLAINTS/PURPOSE OF CONSULTATION: Follow-up for continued management of her marginal zone lymphoma and next cycle of maintenance Rituxan  HISTORY OF PRESENTING ILLNESS:  Please see previous note for details on initial presentation  INTERVAL HISTORY:  Dana Butler is a 67 y.o. 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    Patient was last seen by me on 06/12/2022 and complained of severe chronic dry eye syndrome and stable right neck pain.  Today, she will be receiving day 1 cycle 4 of her Rituxan maintenance treatment. She reports that she is feeling well overall. She reports that her chronic dry eye syndrome has been stable. She was previously given steroid eye drops for 2 weeks, which did improve symptoms. Patient denies taking any nasal plugs.  Patient denies any toxicities with her Rituxan treatment. Patient denies any infection issues, new medications, new lumps/bumps, bone pain, leg swelling, skin rashes, or back pain. She reports that her bone sensitivity has improved. Patient regularly takes vitamin D2 supplements.   MEDICAL HISTORY:  Past Medical History:  Diagnosis Date   Cancer (HCC) 2016   marginal zone non-hodgkins lymphoma   Medical history non-contributory     SURGICAL HISTORY: Past Surgical History:  Procedure Laterality Date   ABDOMINAL HYSTERECTOMY     AXILLARY LYMPH NODE BIOPSY Left 06/29/2014   Procedure: AXILLARY LYMPH NODE BIOPSY;  Surgeon: [name], MD;  Location: [facility];  Service: General;  Laterality: Left;   COLONOSCOPY     DIAGNOSTIC LAPAROSCOPY  2005   BSO    MASS EXCISION N/A 02/23/2019   Procedure: Excision of umbilicus skin lesion;  Surgeon: Peggye Form, DO;  Location: [facility];  Service: Plastics;  Laterality: N/A;    SOCIAL HISTORY: Social History   Socioeconomic History   Marital status: Married    Spouse name: Not on file   Number of children: Not on file   Years of education: Not on file   Highest education level: Not on file  Occupational History   Not on file  Tobacco Use   Smoking status: Never   Smokeless tobacco: Never  Substance and Sexual Activity   Alcohol use: No   Drug use: No   Sexual activity: Not on file  Other Topics Concern   Not on file  Social History Narrative   Not on file   Social Determinants of Health   Financial Resource Strain: Not on file  Food Insecurity: Not on file  Transportation Needs: Not on file  Physical Activity: Not on file  Stress: Not on file  Social Connections: Not on file  Intimate Partner Violence: Not on file    FAMILY HISTORY: Family History  Problem Relation Age of Onset   Breast cancer Paternal Aunt     ALLERGIES:  is allergic to chloraprep one step [chlorhexidine gluconate], latex, and tape.  MEDICATIONS:  Current Outpatient Medications  Medication Sig Dispense Refill   acyclovir (ZOVIRAX) 400 MG tablet Take 1 tablet (400 mg total) by mouth daily. 30 tablet 3   ALPRAZolam (XANAX) 0.5 MG tablet      butalbital-acetaminophen-caffeine (FIORICET) 50-325-40 MG tablet Take 1-2 tablets by mouth  every 6 (six) hours as needed for headache. 20 tablet 0   calcium carbonate (OS-CAL) 1250 (500 Ca) MG chewable tablet Chew by mouth.     Cholecalciferol (VITAMIN D3) 5000 UNITS TABS Take by mouth.     dexamethasone (DECADRON) 4 MG tablet Take 2 tablets (8 mg total) by mouth daily. Take 2 tablets daily. Start the day after bendamustine chemotherapy for 2 days. Take with food. (Patient not taking: Reported on 06/12/2022) 60 tablet 1   erythromycin ophthalmic  ointment Place 1 Application into the left eye at bedtime. 3.5 g 0   estradiol (VIVELLE-DOT) 0.05 MG/24HR patch Place 1 patch onto the skin 2 (two) times a week.     ferrous sulfate 325 (65 FE) MG EC tablet Take 1 tablet (325 mg total) by mouth daily with breakfast.  3   glucosamine-chondroitin 500-400 MG tablet Take by mouth.     Multiple Vitamins-Minerals (MULTIVITAMIN WITH MINERALS) tablet Take 1 tablet by mouth daily.     OMEGA-3 FATTY ACIDS-VITAMIN E PO 1,000 capsules. Take 2 capsules by mouth daily.     ondansetron (ZOFRAN) 8 MG tablet Take 1 tablet (8 mg total) by mouth 2 (two) times daily as needed for nausea or vomiting (2 times daily PRN for refractory nausea / vomiting). Start on day 2 after bendamustine chemo. (Patient not taking: Reported on 12/22/2021) 20 tablet 1   prochlorperazine (COMPAZINE) 10 MG tablet Take 1 tablet (10 mg total) by mouth every 6 (six) hours as needed for nausea or vomiting. (Patient not taking: Reported on 12/22/2021) 30 tablet 1   riTUXimab (RITUXAN) 100 MG/10ML injection See admin instructions.     Turmeric 450 MG CAPS as directed Orally     Vitamin D, Ergocalciferol, (DRISDOL) 1.25 MG (50000 UNIT) CAPS capsule TAKE 1 CAPSULE BY MOUTH ONE TIME PER WEEK 8 capsule 2   zaleplon (SONATA) 10 MG capsule Take 10 mg by mouth at bedtime.     No current facility-administered medications for this visit.    REVIEW OF SYSTEMS:    10 Point review of Systems was done is negative except as noted above.   PHYSICAL EXAMINATION:. Wt Readings from Last 3 Encounters:  06/12/22 129 lb 8 oz (58.7 kg)  04/23/22 134 lb 3.2 oz (60.9 kg)  02/16/22 127 lb 12 oz (57.9 kg)   Vitals:   08/14/22 1226  BP: 133/66  Pulse: (!) 58  Resp: 16  Temp: 97.7 F (36.5 C)  SpO2: 98%     GENERAL:alert, in no acute distress and comfortable SKIN: no acute rashes, no significant lesions EYES: conjunctiva are pink and non-injected, sclera anicteric OROPHARYNX: MMM, no exudates, no  oropharyngeal erythema or ulceration NECK: supple, no JVD LYMPH:  no palpable lymphadenopathy in the cervical, axillary or inguinal regions LUNGS: clear to auscultation b/l with normal respiratory effort HEART: regular rate & rhythm ABDOMEN:  normoactive bowel sounds , non tender, not distended. Extremity: no pedal edema PSYCH: alert & oriented x 3 with fluent speech NEURO: no focal motor/sensory deficits    LABORATORY DATA:  I have reviewed the data as listed  .    Latest Ref Rng & Units 06/12/2022    9:16 AM 04/23/2022    8:20 AM 02/16/2022    9:10 AM  CBC  WBC 4.0 - 10.5 K/uL 2.5  2.4  2.8   Hemoglobin 12.0 - 15.0 g/dL 16.1  09.6  04.5   Hematocrit 36.0 - 46.0 % 38.1  36.1  36.3   Platelets 150 -  400 K/uL 220  208  219   ANC 1700  .    Latest Ref Rng & Units 06/12/2022    9:16 AM 04/23/2022    8:20 AM 02/16/2022    9:10 AM  CMP  Glucose 70 - 99 mg/dL 99  161  98   BUN 8 - 23 mg/dL Creatinine 0.44 - 1.00 mg/dL 0.96  0.45  4.09   Sodium 135 - 145 mmol/L 139  140  139   Potassium 3.5 - 5.1 mmol/L 3.8  3.5  4.0   Chloride 98 - 111 mmol/L 106  107  107   CO2 22 - 32 mmol/L Calcium 8.9 - 10.3 mg/dL 9.6  8.9  9.2   Total Protein 6.5 - 8.1 g/dL 6.1  6.2  6.4   Total Bilirubin 0.3 - 1.2 mg/dL 0.4  0.5  0.4   Alkaline Phos 38 - 126 U/L 113  138  125   AST 15 - 41 U/L ALT 0 - 44 U/L Lab Results  Component Value Date   LDH 140 06/12/2022     RADIOGRAPHIC STUDIES: I have personally reviewed the radiological images as listed and agreed with the findings in the report. No results found.  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 Initially diagnosed in 2016. Patient was diagnosed with nodal marginal zone lymphoma based on lymph node biopsy of her left axillary lymph node on 06/29/2014 and has been following with Dr. Darnelle Catalan for active surveillance and until  recently did not meet criteria for consideration of treatment. In December 2019 on 04/12/2018 the patient had a biopsy of a right breast mass that showed pathology consistent with marginal zone lymphoma.  Patient over the last year or so has had increasing WBC counts which were as high as 91.2k in December 2022.  Patient also noted to have some anemia with hemoglobin of 11 and normal platelet counts of 234k in January 2023.  She also started developing some lymphadenopathy in the neck.  Patient is status post 4 cycles of Bendamustine Rituxan and is on maintenance Rituxan at this time every 2 months  PET CT scan done 09/08/2021 revealed "1. Bulky axillary adenopathy periaortic adenopathy and iliac adenopathy with very low metabolic activity ( [place] 2). Findings consistent with low-grade lymphoma. 2. Normal spleen. 3. Diffuse permeative pattern within the pelvis and spine. No hypermetabolic skeletal lesions."  #2 status post extensive bone involvement from marginal zone lymphoma #3 headaches without any red flags likely migrainous versus tension headaches.  Now much better controlled. #4 S/p Infectious conjunctivitis - related to chronic severe dry eyes. Being mx by her ophthalmologist. Plan:  -Discussed lab results on 08/14/2022 with patient in detail. CBC showed WBC of 2.4K, hemoglobin of 13.0, and platelets of 225K. 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 Do not anticipate any issues with this procedure from a arginal zone lymphoma standpoint -Continue with maintenance Rituxan every 2 months -Rituxan orders reviewed and signed.  Follow-up: Plz schedule next 2 Rituxan infusions per integrated scheduling with labs and MD visit  The total time spent in the appointment was 30 minutes* .  All of the patient's questions were answered with apparent satisfaction. The patient knows to call  the clinic with any problems, questions or concerns.   Wyvonnia Lora MD MS AAHIVMS [facility] Hematology/Oncology Physician [facility]  .*Total Encounter Time as defined by the Centers for Medicare and Medicaid Services includes, in addition to the face-to-face time of a patient visit (documented in the note above) non-face-to-face time: obtaining and reviewing outside history, ordering and reviewing medications, tests or procedures, care coordination (communications with other health care professionals or caregivers) and documentation in the medical record.    I,Mitra Faeizi,acting as a Neurosurgeon for Wyvonnia Lora, MD.,have documented all relevant documentation on the behalf of Wyvonnia Lora, MD,as directed by  Wyvonnia Lora, MD while in the presence of Wyvonnia Lora, MD.  .I have reviewed the above documentation for accuracy and completeness, and I agree with the above. Johney Maine MD

## 2022-08-14 NOTE — Progress Notes (Signed)
Patient seen by Dr. Addison Naegeli are within treatment parameters.  Labs reviewed: and are within treatment parameters./ [facility] still processing  Per physician team, patient is ready for treatment and there are NO modifications to the treatment plan.

## 2022-08-15 ENCOUNTER — Other Ambulatory Visit: Payer: Self-pay

## 2022-08-20 ENCOUNTER — Encounter: Payer: Self-pay | Admitting: Hematology

## 2022-09-18 ENCOUNTER — Other Ambulatory Visit: Payer: Self-pay

## 2022-10-13 ENCOUNTER — Other Ambulatory Visit: Payer: Self-pay

## 2022-10-13 DIAGNOSIS — C8580 Other specified types of non-Hodgkin lymphoma, unspecified site: Secondary | ICD-10-CM

## 2022-10-14 ENCOUNTER — Inpatient Hospital Stay: Payer: Medicare Other

## 2022-10-14 ENCOUNTER — Inpatient Hospital Stay: Payer: Medicare Other | Attending: Oncology

## 2022-10-14 ENCOUNTER — Other Ambulatory Visit: Payer: Self-pay

## 2022-10-14 ENCOUNTER — Inpatient Hospital Stay (HOSPITAL_BASED_OUTPATIENT_CLINIC_OR_DEPARTMENT_OTHER): Payer: Medicare Other | Admitting: Hematology

## 2022-10-14 VITALS — BP 133/65 | HR 62 | Temp 97.6°F | Resp 18

## 2022-10-14 VITALS — BP 113/65 | HR 61 | Temp 98.0°F | Resp 18 | Wt 124.9 lb

## 2022-10-14 DIAGNOSIS — C8580 Other specified types of non-Hodgkin lymphoma, unspecified site: Secondary | ICD-10-CM

## 2022-10-14 DIAGNOSIS — Z5112 Encounter for antineoplastic immunotherapy: Secondary | ICD-10-CM | POA: Diagnosis present

## 2022-10-14 DIAGNOSIS — Z7189 Other specified counseling: Secondary | ICD-10-CM | POA: Diagnosis not present

## 2022-10-14 DIAGNOSIS — Z803 Family history of malignant neoplasm of breast: Secondary | ICD-10-CM | POA: Diagnosis not present

## 2022-10-14 DIAGNOSIS — Z7952 Long term (current) use of systemic steroids: Secondary | ICD-10-CM | POA: Diagnosis not present

## 2022-10-14 DIAGNOSIS — H04129 Dry eye syndrome of unspecified lacrimal gland: Secondary | ICD-10-CM | POA: Insufficient documentation

## 2022-10-14 LAB — CMP (CANCER CENTER ONLY)
ALT: 28 U/L (ref 0–44)
AST: 30 U/L (ref 15–41)
Albumin: 4 g/dL (ref 3.5–5.0)
Alkaline Phosphatase: 133 U/L — ABNORMAL HIGH (ref 38–126)
Anion gap: 6 (ref 5–15)
BUN: 13 mg/dL (ref 8–23)
CO2: 26 mmol/L (ref 22–32)
Calcium: 9.5 mg/dL (ref 8.9–10.3)
Chloride: 106 mmol/L (ref 98–111)
Creatinine: 0.74 mg/dL (ref 0.44–1.00)
GFR, Estimated: >60 mL/min (ref >60)
Glucose, Bld: 99 mg/dL (ref 70–99)
Potassium: 4.2 mmol/L (ref 3.5–5.1)
Sodium: 138 mmol/L (ref 135–145)
Total Bilirubin: 0.5 mg/dL (ref 0.3–1.2)
Total Protein: 6.3 g/dL — ABNORMAL LOW (ref 6.5–8.1)

## 2022-10-14 LAB — CBC WITH DIFFERENTIAL (CANCER CENTER ONLY)
Abs Immature Granulocytes: 0.01 10*3/uL (ref 0.00–0.07)
Basophils Absolute: 0.1 10*3/uL (ref 0.0–0.1)
Basophils Relative: 2 %
Eosinophils Absolute: 0.1 10*3/uL (ref 0.0–0.5)
Eosinophils Relative: 6 %
HCT: 38.9 % (ref 36.0–46.0)
Hemoglobin: 13.3 g/dL (ref 12.0–15.0)
Immature Granulocytes: 0 %
Lymphocytes Relative: 7 %
Lymphs Abs: 0.2 10*3/uL — ABNORMAL LOW (ref 0.7–4.0)
MCH: 31.3 pg (ref 26.0–34.0)
MCHC: 34.2 g/dL (ref 30.0–36.0)
MCV: 91.5 fL (ref 80.0–100.0)
Monocytes Absolute: 0.3 10*3/uL (ref 0.1–1.0)
Monocytes Relative: 15 %
Neutro Abs: 1.6 10*3/uL — ABNORMAL LOW (ref 1.7–7.7)
Neutrophils Relative %: 70 %
Platelet Count: 233 10*3/uL (ref 150–400)
RBC: 4.25 MIL/uL (ref 3.87–5.11)
RDW: 14.6 % (ref 11.5–15.5)
WBC Count: 2.3 10*3/uL — ABNORMAL LOW (ref 4.0–10.5)
nRBC: 0 % (ref 0.0–0.2)

## 2022-10-14 LAB — LACTATE DEHYDROGENASE: LDH: 140 U/L (ref 98–192)

## 2022-10-14 MED ORDER — HEPARIN SOD (PORK) LOCK FLUSH 100 UNIT/ML IV SOLN: 500.0000 [IU] | Freq: Once | INTRAVENOUS | Status: DC | PRN

## 2022-10-14 MED ORDER — METHYLPREDNISOLONE SODIUM SUCC 125 MG IJ SOLR
125.0000 mg | Freq: Once | INTRAMUSCULAR | Status: AC
  Administered 2022-10-14: 125 mg via INTRAVENOUS
  Filled 2022-10-14: qty 2

## 2022-10-14 MED ORDER — SODIUM CHLORIDE 0.9 % IV SOLN
375.0000 mg/m2 | Freq: Once | INTRAVENOUS | Status: AC
  Administered 2022-10-14: 600 mg via INTRAVENOUS
  Filled 2022-10-14: qty 50

## 2022-10-14 MED ORDER — ACETAMINOPHEN 325 MG PO TABS
650.0000 mg | ORAL_TABLET | Freq: Once | ORAL | Status: AC
  Administered 2022-10-14: 650 mg via ORAL
  Filled 2022-10-14: qty 2

## 2022-10-14 MED ORDER — FAMOTIDINE IN NACL 20-0.9 MG/50ML-% IV SOLN
20.0000 mg | Freq: Once | INTRAVENOUS | Status: AC
  Administered 2022-10-14: 20 mg via INTRAVENOUS
  Filled 2022-10-14: qty 50

## 2022-10-14 MED ORDER — SODIUM CHLORIDE 0.9% FLUSH: 10.0000 mL | INTRAVENOUS | Status: DC | PRN

## 2022-10-14 MED ORDER — SODIUM CHLORIDE 0.9 % IV SOLN: Freq: Once | INTRAVENOUS | Status: AC

## 2022-10-14 MED ORDER — DIPHENHYDRAMINE HCL 25 MG PO CAPS
50.0000 mg | ORAL_CAPSULE | Freq: Once | ORAL | Status: AC
  Administered 2022-10-14: 50 mg via ORAL
  Filled 2022-10-14: qty 2

## 2022-10-14 NOTE — Patient Instructions (Signed)
[facility]  Discharge Instructions: Thank you for choosing [facility] to provide your oncology and hematology care.   If you have a lab appointment with the Cancer Center, please go directly to the Cancer Center and check in at the registration area.   Wear comfortable clothing and clothing appropriate for easy access to any Portacath or PICC line.   We strive to give you quality time with your provider. You may need to reschedule your appointment if you arrive late (15 or more minutes).  Arriving late affects you and other patients whose appointments are after yours.  Also, if you miss three or more appointments without notifying the office, you may be dismissed from the clinic at the provider's discretion.      For prescription refill requests, have your pharmacy contact our office and allow 72 hours for refills to be completed.    Today you received the following chemotherapy and/or immunotherapy agents: Rituxan    To help prevent nausea and vomiting after your treatment, we encourage you to take your nausea medication as directed.  BELOW ARE SYMPTOMS THAT SHOULD BE REPORTED IMMEDIATELY: *FEVER GREATER THAN 100.4 F (38 C) OR HIGHER *CHILLS OR SWEATING *NAUSEA AND VOMITING THAT IS NOT CONTROLLED WITH YOUR NAUSEA MEDICATION *UNUSUAL SHORTNESS OF BREATH *UNUSUAL BRUISING OR BLEEDING *URINARY PROBLEMS (pain or burning when urinating, or frequent urination) *BOWEL PROBLEMS (unusual diarrhea, constipation, pain near the anus) TENDERNESS IN MOUTH AND THROAT WITH OR WITHOUT PRESENCE OF ULCERS (sore throat, sores in mouth, or a toothache) UNUSUAL RASH, SWELLING OR PAIN  UNUSUAL VAGINAL DISCHARGE OR ITCHING   Items with * indicate a potential emergency and should be followed up as soon as possible or go to the Emergency Department if any problems should occur.  Please show the CHEMOTHERAPY ALERT CARD or IMMUNOTHERAPY ALERT CARD at check-in  to the Emergency Department and triage nurse.  Should you have questions after your visit or need to cancel or reschedule your appointment, please contact [facility]  Dept: [phone]  and follow the prompts.  Office hours are 8:00 a.m. to 4:30 p.m. Monday - Friday. Please note that voicemails left after 4:00 p.m. may not be returned until the following business day.  We are closed weekends and major holidays. You have access to a nurse at all times for urgent questions. Please call the main number to the clinic Dept: [phone] and follow the prompts.   For any non-urgent questions, you may also contact your provider using MyChart. We now offer e-Visits for anyone 18 and older to request care online for non-urgent symptoms. For details visit [email or web address].   Also download the MyChart app! Go to the app store, search "MyChart", open the app, select [facility], and log in with your MyChart username and password.   

## 2022-10-14 NOTE — Progress Notes (Signed)
Patient seen by Dr. Kale  Vitals are within treatment parameters.  Labs reviewed: and are within treatment parameters.  Per physician team, patient is ready for treatment and there are NO modifications to the treatment plan.  

## 2022-10-14 NOTE — Progress Notes (Signed)
HEMATOLOGY/ONCOLOGY CLINIC VISIT NOTE  Date of Service: 10/14/22   Patient Care Team: [name], DO as PCP - General (Internal Medicine) Flo Shanks, MD as Consulting Physician (Otolaryngology) [name], MD as Consulting Physician (General Surgery) [name], RD as Dietitian (Family Medicine)  CHIEF COMPLAINTS/PURPOSE OF CONSULTATION: Follow-up for continued management of her marginal zone lymphoma and next cycle of maintenance Rituxan  HISTORY OF PRESENTING ILLNESS:  Please see previous note for details on initial presentation  INTERVAL HISTORY:  [name] is a 67 y.o. 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    Patient was last seen by me on 08/14/2022 and reported stable chronic dry eye syndrome and was doing well overall with no new medical concerns.   Today, she will be receiving day 1 cycle 5 of her Rituxan maintenance treatment. Patient has been tolerating her Rituxan treatment well with no toxicity issues. She denies any unexplained fever, chills, night sweats, new lumps/bumps, abdominal pain, diarrhea, leg swelling, new bone pain, pain along the spine, or other major medication changes.  She denies any infection issues or further eye infections. She does report mild redness with chronic dry eye syndrome. She does frequently use Prednisone drops and stays hydrated to improve symptoms. She does complain of shallow breathing and denies any acid reflux.   No fevers/chills/ night sweats/significant weight loss.  MEDICAL HISTORY:  Past Medical History:  Diagnosis Date   Cancer (HCC) 2016   marginal zone non-hodgkins lymphoma   Medical history non-contributory     SURGICAL HISTORY: Past Surgical History:  Procedure Laterality Date   ABDOMINAL HYSTERECTOMY     AXILLARY LYMPH NODE BIOPSY Left 06/29/2014   Procedure: AXILLARY LYMPH NODE BIOPSY;  Surgeon: [name], MD;  Location: [facility];  Service: General;  Laterality: Left;   COLONOSCOPY     DIAGNOSTIC LAPAROSCOPY  2005   BSO   MASS EXCISION N/A 02/23/2019   Procedure: Excision of umbilicus skin lesion;  Surgeon: Peggye Form, DO;  Location: [facility];  Service: Plastics;  Laterality: N/A;    SOCIAL HISTORY: Social History   Socioeconomic History   Marital status: Married    Spouse name: Not on file   Number of children: Not on file   Years of education: Not on file   Highest education level: Not on file  Occupational History   Not on file  Tobacco Use   Smoking status: Never   Smokeless tobacco: Never  Substance and Sexual Activity   Alcohol use: No   Drug use: No   Sexual activity: Not on file  Other Topics Concern   Not on file  Social History Narrative   Not on file   Social Determinants of Health   Financial Resource Strain: Not on file  Food Insecurity: Not on file  Transportation Needs: Not on file  Physical Activity: Not on file  Stress: Not on file  Social Connections: Not on file  Intimate Partner Violence: Not on file    FAMILY HISTORY: Family History  Problem Relation Age of Onset   Breast cancer Paternal Aunt     ALLERGIES:  is allergic to chloraprep one step [chlorhexidine gluconate], latex, and tape.  MEDICATIONS:  Current Outpatient Medications  Medication Sig Dispense Refill   acyclovir (ZOVIRAX) 400 MG tablet Take 1 tablet (400 mg total) by mouth daily. 30 tablet 3   ALPRAZolam (XANAX) 0.5 MG tablet  calcium carbonate (OS-CAL) 1250 (500 Ca) MG chewable tablet Chew by mouth.     Cholecalciferol (VITAMIN D3) 5000 UNITS TABS Take by mouth.     dexamethasone (DECADRON) 4 MG tablet Take 2 tablets (8 mg total) by mouth daily. Take 2 tablets daily. Start the day after bendamustine chemotherapy for 2 days. Take with food. (Patient not taking: Reported on 06/12/2022) 60 tablet 1   erythromycin ophthalmic ointment Place 1 Application into the left  eye at bedtime. 3.5 g 0   estradiol (VIVELLE-DOT) 0.05 MG/24HR patch Place 1 patch onto the skin 2 (two) times a week.     ferrous sulfate 325 (65 FE) MG EC tablet Take 1 tablet (325 mg total) by mouth daily with breakfast.  3   glucosamine-chondroitin 500-400 MG tablet Take by mouth.     Multiple Vitamins-Minerals (MULTIVITAMIN WITH MINERALS) tablet Take 1 tablet by mouth daily.     OMEGA-3 FATTY ACIDS-VITAMIN E PO 1,000 capsules. Take 2 capsules by mouth daily.     ondansetron (ZOFRAN) 8 MG tablet Take 1 tablet (8 mg total) by mouth 2 (two) times daily as needed for nausea or vomiting (2 times daily PRN for refractory nausea / vomiting). Start on day 2 after bendamustine chemo. (Patient not taking: Reported on 12/22/2021) 20 tablet 1   prochlorperazine (COMPAZINE) 10 MG tablet Take 1 tablet (10 mg total) by mouth every 6 (six) hours as needed for nausea or vomiting. (Patient not taking: Reported on 12/22/2021) 30 tablet 1   riTUXimab (RITUXAN) 100 MG/10ML injection See admin instructions.     Turmeric 450 MG CAPS as directed Orally     Vitamin D, Ergocalciferol, (DRISDOL) 1.25 MG (50000 UNIT) CAPS capsule TAKE 1 CAPSULE BY MOUTH ONE TIME PER WEEK 8 capsule 2   zaleplon (SONATA) 10 MG capsule Take 10 mg by mouth at bedtime.     No current facility-administered medications for this visit.    REVIEW OF SYSTEMS:    10 Point review of Systems was done is negative except as noted above.   PHYSICAL EXAMINATION:. Wt Readings from Last 3 Encounters:  08/14/22 124 lb 4.8 oz (56.4 kg)  06/12/22 129 lb 8 oz (58.7 kg)  04/23/22 134 lb 3.2 oz (60.9 kg)   Vitals:   10/14/22 0905  BP: 113/65  Pulse: 61  Resp: 18  Temp: 98 F (36.7 C)  SpO2: 98%     GENERAL:alert, in no acute distress and comfortable SKIN: no acute rashes, no significant lesions EYES: conjunctiva are pink and non-injected, sclera anicteric OROPHARYNX: MMM, no exudates, no oropharyngeal erythema or ulceration NECK: supple, no  JVD LYMPH:  no palpable lymphadenopathy in the cervical, axillary or inguinal regions LUNGS: clear to auscultation b/l with normal respiratory effort HEART: regular rate & rhythm ABDOMEN:  normoactive bowel sounds , non tender, not distended. Extremity: no pedal edema PSYCH: alert & oriented x 3 with fluent speech NEURO: no focal motor/sensory deficits    LABORATORY DATA:  I have reviewed the data as listed  .    Latest Ref Rng & Units 10/14/2022    8:47 AM 08/14/2022   12:10 PM 06/12/2022    9:16 AM  CBC  WBC 4.0 - 10.5 K/uL 2.3  2.4  2.5   Hemoglobin 12.0 - 15.0 g/dL 44.0  34.7  42.5   Hematocrit 36.0 - 46.0 % 38.9  38.3  38.1   Platelets 150 - 400 K/uL 233  225  220   ANC 1700  .  Latest Ref Rng & Units 10/14/2022    8:47 AM 08/14/2022   12:10 PM 06/12/2022    9:16 AM  CMP  Glucose 70 - 99 mg/dL 99  87  99   BUN 8 - 23 mg/dL 13  8  11    Creatinine 0.44 - 1.00 mg/dL 1.61  0.96  0.45   Sodium 135 - 145 mmol/L 138  137  139   Potassium 3.5 - 5.1 mmol/L 4.2  4.0  3.8   Chloride 98 - 111 mmol/L 106  103  106   CO2 22 - 32 mmol/L 26  31  28    Calcium 8.9 - 10.3 mg/dL 9.5  9.7  9.6   Total Protein 6.5 - 8.1 g/dL 6.3  6.3  6.1   Total Bilirubin 0.3 - 1.2 mg/dL 0.5  0.6  0.4   Alkaline Phos 38 - 126 U/L 133  110  113   AST 15 - 41 U/L 30  24  21    ALT 0 - 44 U/L 28  23  18      Lab Results  Component Value Date   LDH 117 08/14/2022     RADIOGRAPHIC STUDIES: I have personally reviewed the radiological images as listed and agreed with the findings in the report. No results found.  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 Initially diagnosed in 2016. Patient was diagnosed with nodal marginal zone lymphoma based on lymph node biopsy of her left axillary lymph node on 06/29/2014 and has been following with Dr. Darnelle Catalan for active surveillance and until recently did not meet criteria for consideration of  treatment. In December 2019 on 04/12/2018 the patient had a biopsy of a right breast mass that showed pathology consistent with marginal zone lymphoma.  Patient over the last year or so has had increasing WBC counts which were as high as 91.2k in December 2022.  Patient also noted to have some anemia with hemoglobin of 11 and normal platelet counts of 234k in January 2023.  She also started developing some lymphadenopathy in the neck.  Patient is status post 4 cycles of Bendamustine Rituxan and is on maintenance Rituxan at this time every 2 months  PET CT scan done 09/08/2021 revealed "1. Bulky axillary adenopathy periaortic adenopathy and iliac adenopathy with very low metabolic activity ( [place] 2). Findings consistent with low-grade lymphoma. 2. Normal spleen. 3. Diffuse permeative pattern within the pelvis and spine. No hypermetabolic skeletal lesions."  #2 status post extensive bone involvement from marginal zone lymphoma #3 headaches without any red flags likely migrainous versus tension headaches.  Now much better controlled. #4 S/p Infectious conjunctivitis - related to chronic severe dry eyes. Being mx by her ophthalmologist.  Plan:  -Discussed lab results on 10/14/2022 in detail with patient. CBC showed WBC of 2.3K, hemoglobin improved to 13.3, and platelets of 233K. -slightly low WBC due to low lymphocytes, no neutropenia -CMP wnl -continue with next dose of maintenance Rituxan. No infections issues or new toxicities noted. -Continue D2 vitamin supplement -discussed option of breathing exercises to improve breathing habits -will order CT chest/abd pelvis scan prior to next visit in 2 months -informed patient that CT scan would show any obvious findings in the lung  -discussed option of monitoring disease off maintenance Rituxan if good response on CT scan  Follow-up: CT chest/abd/pelvis in 6 weeks Plz schedule next cycles of maintenance Rituxan as per integrated  scheduling in 2 months  with labs and MD visit  The total time spent in the appointment was 30 minutes* .  All of the patient's questions were answered with apparent satisfaction. The patient knows to call the clinic with any problems, questions or concerns.   Wyvonnia Lora MD MS AAHIVMS [facility] Hematology/Oncology Physician [facility]  .*Total Encounter Time as defined by the Centers for Medicare and Medicaid Services includes, in addition to the face-to-face time of a patient visit (documented in the note above) non-face-to-face time: obtaining and reviewing outside history, ordering and reviewing medications, tests or procedures, care coordination (communications with other health care professionals or caregivers) and documentation in the medical record.    I,Mitra Faeizi,acting as a Neurosurgeon for Wyvonnia Lora, MD.,have documented all relevant documentation on the behalf of Wyvonnia Lora, MD,as directed by  Wyvonnia Lora, MD while in the presence of Wyvonnia Lora, MD.  .I have reviewed the above documentation for accuracy and completeness, and I agree with the above. Johney Maine MD

## 2022-10-19 ENCOUNTER — Encounter: Payer: Self-pay | Admitting: Hematology

## 2022-11-25 ENCOUNTER — Ambulatory Visit (HOSPITAL_COMMUNITY)
Admission: RE | Admit: 2022-11-25 | Discharge: 2022-11-25 | Disposition: A | Discharge status: Home / Self Care | Payer: Medicare Other | Source: Ambulatory Visit | Attending: Hematology | Admitting: Hematology

## 2022-11-25 DIAGNOSIS — C8580 Other specified types of non-Hodgkin lymphoma, unspecified site: Secondary | ICD-10-CM | POA: Diagnosis present

## 2022-11-25 MED ORDER — IOHEXOL 9 MG/ML PO SOLN
1000.0000 mL | ORAL | Status: AC
  Administered 2022-11-25: 1000 mL via ORAL

## 2022-11-25 MED ORDER — IOHEXOL 9 MG/ML PO SOLN
ORAL | Status: AC
  Filled 2022-11-25: qty 1000

## 2022-11-25 MED ORDER — IOHEXOL 300 MG/ML  SOLN
100.0000 mL | Freq: Once | INTRAMUSCULAR | Status: AC | PRN
  Administered 2022-11-25: 100 mL via INTRAVENOUS

## 2022-12-08 IMAGING — PT NM PET IMAGE RESTAGE (PS) WHOLE BODY
1 series · 5 of 5 positions shown · non-contrast
Comparison: None Available.

CLINICAL DATA: Subsequent treatment strategy for marginal zone
lymphoma. Bone pain.

EXAM:
NUCLEAR MEDICINE PET WHOLE BODY
TECHNIQUE: 6.0 mCi F-18 FDG was injected intravenously. Full-ring PET imaging
was performed from the head to foot after the radiotracer. CT data
was obtained and used for attenuation correction and anatomic
localization.
Fasting blood glucose: 107 mg/dl

[Series 1168: results mm oncology reading · 4.0mm · 0.90mm/px · 5 of 5 slices shown]
[im 1/5]
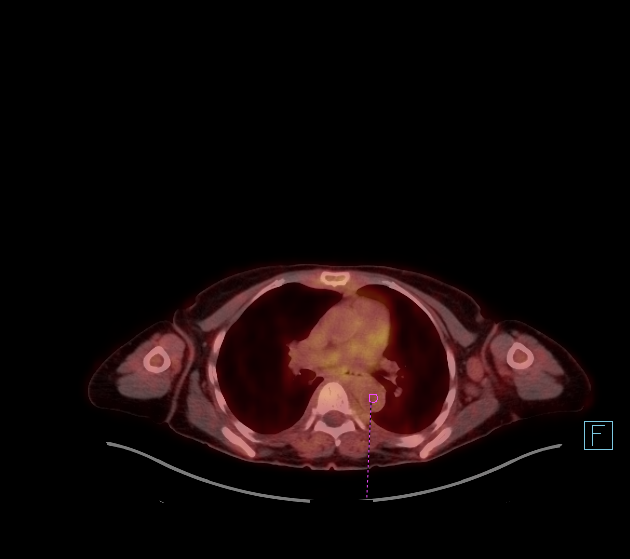
[im 2/5]
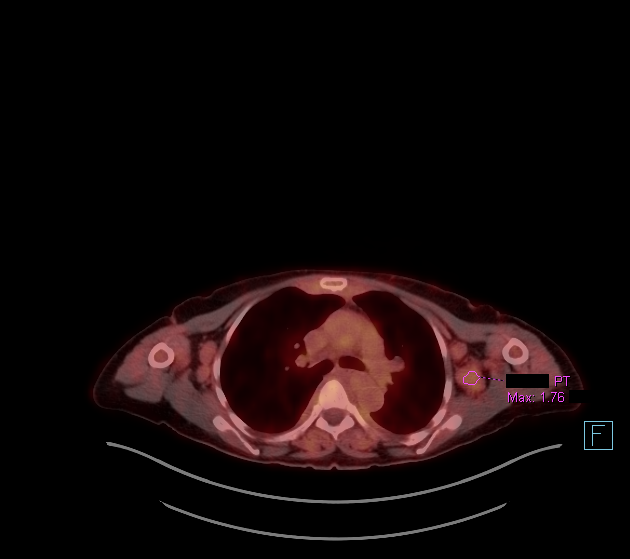
[im 3/5]
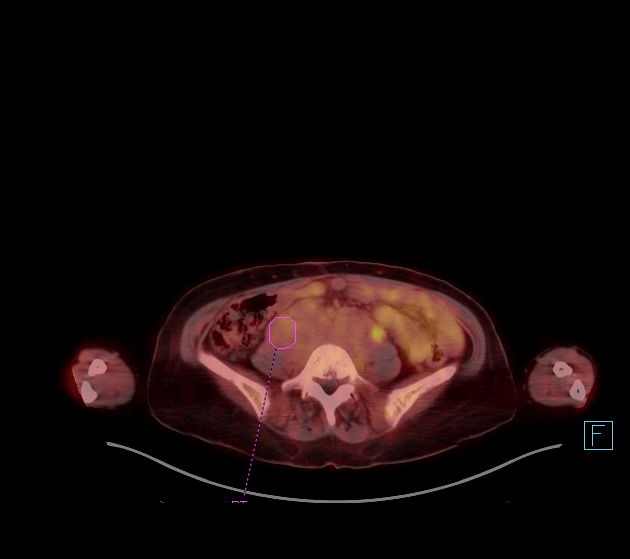
[im 4/5]
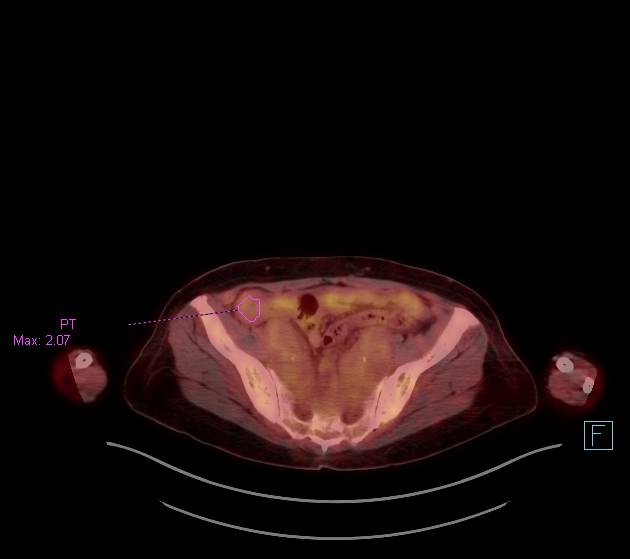
[im 5/5]
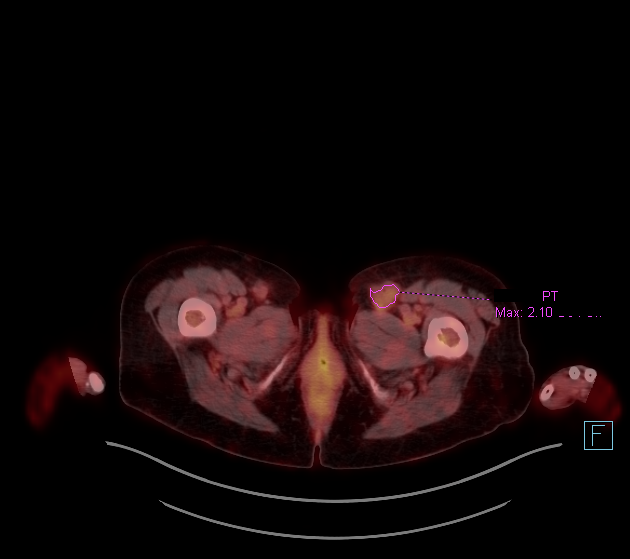

[5 of 5 positions shown; findings below may reference images not displayed]

FINDINGS: Mediastinal blood pool activity: SUV max

HEAD/NECK: No hypermetabolic activity in the scalp. No
hypermetabolic cervical lymph nodes.

Incidental CT findings: none

CHEST: Bilateral large axial lymph nodes with out significant
metabolic activity. For example LEFT axillary lymph node measuring
20 mm short axis with SUV max equal 1.7 (similar to background blood
pool)

Incidental CT findings: No suspicious pulmonary nodules. Mediastinal
adenopathy.

ABDOMEN/PELVIS: Bulky periaortic adenopathy with low metabolic
activity. For example lymph node RIGHT of the aorta measures 32 mm
with SUV max equal 2.2 (image 166). Bulky adenopathy along the LEFT
and RIGHT margin aorta without metabolic activity

Bulky iliac adenopathy also with low metabolic activity. For example
RIGHT external iliac lymph node measuring 2.9 cm (image 181) with
SUV max equal 2.1.

Prominent inguinal adenopathy. For example 2.1 cm LEFT inguinal node
with SUV max equal 2.1.

Normal spleen with normal metabolic activity

Incidental CT findings: none

SKELETON: There is a diffuse permeative pattern throughout the
pelvis and spine without metabolic activity.

Incidental CT findings: none

EXTREMITIES: No abnormal hypermetabolic activity in the lower
extremities.

Incidental CT findings: none
IMPRESSION: 1. Bulky axillary adenopathy periaortic adenopathy and iliac
adenopathy with very low metabolic activity ( [HOSPITAL] 2). Findings
consistent with low-grade lymphoma.
2. Normal spleen.
3. Diffuse permeative pattern within the pelvis and spine. No
hypermetabolic skeletal lesions.

## 2022-12-15 ENCOUNTER — Other Ambulatory Visit: Payer: Self-pay

## 2022-12-15 DIAGNOSIS — C8580 Other specified types of non-Hodgkin lymphoma, unspecified site: Secondary | ICD-10-CM

## 2022-12-16 ENCOUNTER — Inpatient Hospital Stay: Payer: Medicare Other

## 2022-12-16 ENCOUNTER — Inpatient Hospital Stay: Payer: Medicare Other | Attending: Oncology | Admitting: Hematology

## 2022-12-16 VITALS — BP 131/62 | HR 57 | Resp 16

## 2022-12-16 DIAGNOSIS — Z7952 Long term (current) use of systemic steroids: Secondary | ICD-10-CM | POA: Diagnosis not present

## 2022-12-16 DIAGNOSIS — H04129 Dry eye syndrome of unspecified lacrimal gland: Secondary | ICD-10-CM | POA: Diagnosis not present

## 2022-12-16 DIAGNOSIS — Z7189 Other specified counseling: Secondary | ICD-10-CM

## 2022-12-16 DIAGNOSIS — C8580 Other specified types of non-Hodgkin lymphoma, unspecified site: Secondary | ICD-10-CM | POA: Diagnosis present

## 2022-12-16 DIAGNOSIS — Z803 Family history of malignant neoplasm of breast: Secondary | ICD-10-CM | POA: Diagnosis not present

## 2022-12-16 DIAGNOSIS — Z5112 Encounter for antineoplastic immunotherapy: Secondary | ICD-10-CM | POA: Insufficient documentation

## 2022-12-16 LAB — CMP (CANCER CENTER ONLY)
ALT: 17 U/L (ref 0–44)
AST: 21 U/L (ref 15–41)
Albumin: 4 g/dL (ref 3.5–5.0)
Alkaline Phosphatase: 89 U/L (ref 38–126)
Anion gap: 5 (ref 5–15)
BUN: 11 mg/dL (ref 8–23)
CO2: 30 mmol/L (ref 22–32)
Calcium: 9.3 mg/dL (ref 8.9–10.3)
Chloride: 102 mmol/L (ref 98–111)
Creatinine: 0.68 mg/dL (ref 0.44–1.00)
GFR, Estimated: >60 mL/min (ref >60)
Glucose, Bld: 97 mg/dL (ref 70–99)
Potassium: 3.8 mmol/L (ref 3.5–5.1)
Sodium: 137 mmol/L (ref 135–145)
Total Bilirubin: 0.8 mg/dL (ref 0.3–1.2)
Total Protein: 6.3 g/dL — ABNORMAL LOW (ref 6.5–8.1)

## 2022-12-16 LAB — CBC WITH DIFFERENTIAL (CANCER CENTER ONLY)
Abs Immature Granulocytes: 0.01 10*3/uL (ref 0.00–0.07)
Basophils Absolute: 0 10*3/uL (ref 0.0–0.1)
Basophils Relative: 2 %
Eosinophils Absolute: 0.1 10*3/uL (ref 0.0–0.5)
Eosinophils Relative: 6 %
HCT: 38.4 % (ref 36.0–46.0)
Hemoglobin: 13.5 g/dL (ref 12.0–15.0)
Immature Granulocytes: 0 %
Lymphocytes Relative: 11 %
Lymphs Abs: 0.3 10*3/uL — ABNORMAL LOW (ref 0.7–4.0)
MCH: 31.8 pg (ref 26.0–34.0)
MCHC: 35.2 g/dL (ref 30.0–36.0)
MCV: 90.4 fL (ref 80.0–100.0)
Monocytes Absolute: 0.3 10*3/uL (ref 0.1–1.0)
Monocytes Relative: 13 %
Neutro Abs: 1.6 10*3/uL — ABNORMAL LOW (ref 1.7–7.7)
Neutrophils Relative %: 68 %
Platelet Count: 225 10*3/uL (ref 150–400)
RBC: 4.25 MIL/uL (ref 3.87–5.11)
RDW: 14.3 % (ref 11.5–15.5)
WBC Count: 2.3 10*3/uL — ABNORMAL LOW (ref 4.0–10.5)
nRBC: 0 % (ref 0.0–0.2)

## 2022-12-16 LAB — LACTATE DEHYDROGENASE: LDH: 130 U/L (ref 98–192)

## 2022-12-16 MED ORDER — METHYLPREDNISOLONE SODIUM SUCC 125 MG IJ SOLR
125.0000 mg | Freq: Once | INTRAMUSCULAR | Status: AC
  Administered 2022-12-16: 125 mg via INTRAVENOUS
  Filled 2022-12-16: qty 2

## 2022-12-16 MED ORDER — SODIUM CHLORIDE 0.9 % IV SOLN: Freq: Once | INTRAVENOUS | Status: AC

## 2022-12-16 MED ORDER — FAMOTIDINE IN NACL 20-0.9 MG/50ML-% IV SOLN
20.0000 mg | Freq: Once | INTRAVENOUS | Status: AC
  Administered 2022-12-16: 20 mg via INTRAVENOUS
  Filled 2022-12-16: qty 50

## 2022-12-16 MED ORDER — DIPHENHYDRAMINE HCL 25 MG PO CAPS
50.0000 mg | ORAL_CAPSULE | Freq: Once | ORAL | Status: AC
  Administered 2022-12-16: 50 mg via ORAL
  Filled 2022-12-16: qty 2

## 2022-12-16 MED ORDER — ACETAMINOPHEN 325 MG PO TABS
650.0000 mg | ORAL_TABLET | Freq: Once | ORAL | Status: AC
  Administered 2022-12-16: 650 mg via ORAL
  Filled 2022-12-16: qty 2

## 2022-12-16 MED ORDER — SODIUM CHLORIDE 0.9 % IV SOLN
375.0000 mg/m2 | Freq: Once | INTRAVENOUS | Status: AC
  Administered 2022-12-16: 600 mg via INTRAVENOUS
  Filled 2022-12-16: qty 50

## 2022-12-16 NOTE — Progress Notes (Signed)
HEMATOLOGY/ONCOLOGY CLINIC VISIT NOTE  Date of Service: 12/16/22   Patient Care Team: [name], DO as PCP - General (Internal Medicine) Flo Shanks, MD as Consulting Physician (Otolaryngology) [name], MD as Consulting Physician (General Surgery) [name], RD as Dietitian (Family Medicine)  CHIEF COMPLAINTS/PURPOSE OF CONSULTATION: Follow-up for continued management of her marginal zone lymphoma and next cycle of maintenance Rituxan  HISTORY OF PRESENTING ILLNESS:  Please see previous note for details on initial presentation  INTERVAL HISTORY:  Dana Butler is a 67 y.o. 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    Patient was last seen by me on 10/14/2022 and reported mild redness with chronic dry eye syndrome and shallow breathing, but was otherwise doing well overall with no new medical concerns.   Today, she will be receiving day 1 cycle 6 of her Rituxan maintenance treatment. Patient has been tolerating Rituxan well with no toxicity issues.   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   MEDICAL HISTORY:  Past Medical History:  Diagnosis Date   Cancer (HCC) 2016   marginal zone non-hodgkins lymphoma   Medical history non-contributory     SURGICAL HISTORY: Past Surgical History:  Procedure Laterality Date   ABDOMINAL HYSTERECTOMY     AXILLARY LYMPH NODE BIOPSY Left 06/29/2014   Procedure: AXILLARY LYMPH NODE BIOPSY;  Surgeon: [name], MD;  Location: [facility];  Service: General;  Laterality: Left;   COLONOSCOPY     DIAGNOSTIC LAPAROSCOPY  2005   BSO   MASS EXCISION N/A 02/23/2019   Procedure: Excision of umbilicus skin lesion;  Surgeon: Peggye Form, DO;  Location: [facility];  Service: Plastics;  Laterality: N/A;    SOCIAL HISTORY: Social History   Socioeconomic History   Marital status: Married    Spouse name: Not on file   Number of children: Not on file   Years of education: Not on file   Highest education level: Not on file  Occupational History   Not on file  Tobacco Use   Smoking status: Never   Smokeless tobacco: Never  Substance and Sexual Activity   Alcohol use: No   Drug use: No   Sexual activity: Not on file  Other Topics Concern   Not on file  Social History Narrative   Not on file   Social Determinants of Health   Financial Resource Strain: Not on file  Food Insecurity: Not on file  Transportation Needs: Not on file  Physical Activity: Not on file  Stress: Not on file  Social Connections: Not on file  Intimate Partner Violence: Not on file    FAMILY HISTORY: Family History  Problem Relation Age of Onset   Breast cancer Paternal Aunt     ALLERGIES:  is allergic to chloraprep one step [chlorhexidine gluconate], latex, and tape.  MEDICATIONS:  Current Outpatient Medications  Medication Sig Dispense Refill   acyclovir (ZOVIRAX) 400 MG tablet Take 1 tablet (400 mg total) by mouth daily. 30 tablet 3   ALPRAZolam (XANAX) 0.5 MG tablet      calcium carbonate (OS-CAL) 1250 (500 Ca) MG chewable tablet Chew by mouth.     Cholecalciferol (VITAMIN D3) 5000 UNITS TABS Take by mouth.     dexamethasone (DECADRON)  4 MG tablet Take 2 tablets (8 mg total) by mouth daily. Take 2 tablets daily. Start the day after bendamustine chemotherapy for 2 days. Take with food. (Patient not taking: Reported on 06/12/2022) 60 tablet 1   erythromycin ophthalmic ointment Place 1 Application into the left eye at bedtime. 3.5 g 0   estradiol (VIVELLE-DOT) 0.05 MG/24HR patch Place 1 patch onto the skin 2 (two) times a week.     ferrous sulfate 325 (65 FE) MG EC tablet Take 1 tablet (325 mg total) by mouth daily with breakfast.  3    glucosamine-chondroitin 500-400 MG tablet Take by mouth.     Multiple Vitamins-Minerals (MULTIVITAMIN WITH MINERALS) tablet Take 1 tablet by mouth daily.     OMEGA-3 FATTY ACIDS-VITAMIN E PO 1,000 capsules. Take 2 capsules by mouth daily.     ondansetron (ZOFRAN) 8 MG tablet Take 1 tablet (8 mg total) by mouth 2 (two) times daily as needed for nausea or vomiting (2 times daily PRN for refractory nausea / vomiting). Start on day 2 after bendamustine chemo. (Patient not taking: Reported on 12/22/2021) 20 tablet 1   prochlorperazine (COMPAZINE) 10 MG tablet Take 1 tablet (10 mg total) by mouth every 6 (six) hours as needed for nausea or vomiting. (Patient not taking: Reported on 12/22/2021) 30 tablet 1   riTUXimab (RITUXAN) 100 MG/10ML injection See admin instructions.     Turmeric 450 MG CAPS as directed Orally     Vitamin D, Ergocalciferol, (DRISDOL) 1.25 MG (50000 UNIT) CAPS capsule TAKE 1 CAPSULE BY MOUTH ONE TIME PER WEEK 8 capsule 2   zaleplon (SONATA) 10 MG capsule Take 10 mg by mouth at bedtime.     No current facility-administered medications for this visit.    REVIEW OF SYSTEMS:    10 Point review of Systems was done is negative except as noted above.   PHYSICAL EXAMINATION:. Wt Readings from Last 3 Encounters:  10/14/22 124 lb 14.4 oz (56.7 kg)  08/14/22 124 lb 4.8 oz (56.4 kg)  06/12/22 129 lb 8 oz (58.7 kg)   Vitals:   12/16/22 0916  BP: 135/67  Pulse: (!) 56  Resp: 18  Temp: 97.6 F (36.4 C)   GENERAL:alert, in no acute distress and comfortable SKIN: no acute rashes, no significant lesions EYES: conjunctiva are pink and non-injected, sclera anicteric OROPHARYNX: MMM, no exudates, no oropharyngeal erythema or ulceration NECK: supple, no JVD LYMPH:  no palpable lymphadenopathy in the cervical, axillary or inguinal regions LUNGS: clear to auscultation b/l with normal respiratory effort HEART: regular rate & rhythm ABDOMEN:  normoactive bowel sounds , non tender, not  distended. Extremity: no pedal edema PSYCH: alert & oriented x 3 with fluent speech NEURO: no focal motor/sensory deficits   LABORATORY DATA:  I have reviewed the data as listed  .    Latest Ref Rng & Units 12/16/2022    8:46 AM 10/14/2022    8:47 AM 08/14/2022   12:10 PM  CBC  WBC 4.0 - 10.5 K/uL 2.3  2.3  2.4   Hemoglobin 12.0 - 15.0 g/dL 74.2  59.5  63.8   Hematocrit 36.0 - 46.0 % 38.4  38.9  38.3   Platelets 150 - 400 K/uL 225  233  225   ANC 1700  .    Latest Ref Rng & Units 12/16/2022    8:46 AM 10/14/2022    8:47 AM 08/14/2022   12:10 PM  CMP  Glucose 70 - 99 mg/dL 97  99  87   BUN 8 - 23 mg/dL 11  13  8    Creatinine 0.44 - 1.00 mg/dL 1.61  0.96  0.45   Sodium 135 - 145 mmol/L 137  138  137   Potassium 3.5 - 5.1 mmol/L 3.8  4.2  4.0   Chloride 98 - 111 mmol/L 102  106  103   CO2 22 - 32 mmol/L 30  26  31    Calcium 8.9 - 10.3 mg/dL 9.3  9.5  9.7   Total Protein 6.5 - 8.1 g/dL 6.3  6.3  6.3   Total Bilirubin 0.3 - 1.2 mg/dL 0.8  0.5  0.6   Alkaline Phos 38 - 126 U/L 89  133  110   AST 15 - 41 U/L 21  30  24    ALT 0 - 44 U/L 17  28  23      Lab Results  Component Value Date   LDH 130 12/16/2022     RADIOGRAPHIC STUDIES: I have personally reviewed the radiological images as listed and agreed with the findings in the report. CT CHEST ABDOMEN PELVIS W CONTRAST  Result Date: 11/27/2022 CLINICAL DATA:  Marginal zone lymphoma, assess treatment response * Tracking Code: BO * EXAM: CT CHEST, ABDOMEN, AND PELVIS WITH CONTRAST TECHNIQUE: Multidetector CT imaging of the chest, abdomen and pelvis was performed following the standard protocol during bolus administration of intravenous contrast. RADIATION DOSE REDUCTION: This exam was performed according to the departmental dose-optimization program which includes automated exposure control, adjustment of the mA and/or kV according to patient size and/or use of iterative reconstruction technique. CONTRAST:  OMNIPAQUE IOHEXOL  300 MG/ML SOLN additional oral enteric contrast COMPARISON:  PET-CT, 02/05/2022 FINDINGS: CT CHEST FINDINGS Cardiovascular: Aortic atherosclerosis. Normal heart size. No pericardial effusion. Mediastinum/Nodes: Continued interval decrease in size of bilateral axillary lymph nodes, index left axillary node measuring 1.2 x 0.7 cm, previously 1.8 x 1.1 cm (series 3, image 20) no persistently enlarged mediastinal lymph nodes. Unchanged treated periaortic/paraesophageal soft tissue (series 2, image 71) thyroid gland, trachea, and esophagus demonstrate no significant findings. Lungs/Pleura: Lungs are clear. No pleural effusion or pneumothorax. Musculoskeletal: No chest wall abnormality. No acute osseous findings. CT ABDOMEN PELVIS FINDINGS Hepatobiliary: No solid liver abnormality is seen. No gallstones, gallbladder wall thickening, or biliary dilatation. Pancreas: Unremarkable. No pancreatic ductal dilatation or surrounding inflammatory changes. Spleen: Normal in size without significant abnormality. Adrenals/Urinary Tract: Adrenal glands are unremarkable. Kidneys are normal, without renal calculi, solid lesion, or hydronephrosis. Bladder is unremarkable. Stomach/Bowel: Stomach is within normal limits. Appendix appears normal. No evidence of bowel wall thickening, distention, or inflammatory changes. Vascular/Lymphatic: Scattered aortic atherosclerosis. Unchanged, matted lymph nodes and soft tissue in the retroperitoneum and pelvis, with few, residual prominent lymph nodes, matted left retroperitoneal soft tissue measuring 3.3 x 1.8 cm (series 2, image 69). Unchanged matted tissue and left inguinal lymph nodes measuring up to 1.7 x 1.3 cm (series 2, image 111). Reproductive: Status post hysterectomy Other: No abdominal wall hernia or abnormality. No ascites. Musculoskeletal: No acute osseous findings. IMPRESSION: 1. Continued interval decrease in size of bilateral axillary lymph nodes, consistent with treatment response.  2. Unchanged, matted lymph nodes and soft tissue in the retroperitoneum and pelvis. 3. No new evidence of lymphoma in the chest, abdomen, or pelvis. Aortic Atherosclerosis (ICD10-I70.0). Electronically Signed   By: Jearld Lesch M.D.   On: 11/27/2022 20:24    ASSESSMENT & PLAN:   67 year old female with  #1  Stage IV symptomatic nodal marginal  zone lymphoma. Initially diagnosed in 2016. Patient was diagnosed with nodal marginal zone lymphoma based on lymph node biopsy of her left axillary lymph node on 06/29/2014 and has been following with Dr. Darnelle Catalan for active surveillance and until recently did not meet criteria for consideration of treatment. In December 2019 on 04/12/2018 the patient had a biopsy of a right breast mass that showed pathology consistent with marginal zone lymphoma.  Patient over the last year or so has had increasing WBC counts which were as high as 91.2k in December 2022.  Patient also noted to have some anemia with hemoglobin of 11 and normal platelet counts of 234k in January 2023.  She also started developing some lymphadenopathy in the neck.  Patient is status post 4 cycles of Bendamustine Rituxan and is on maintenance Rituxan at this time every 2 months  PET CT scan done 09/08/2021 revealed "1. Bulky axillary adenopathy periaortic adenopathy and iliac adenopathy with very low metabolic activity ( [place] 2). Findings consistent with low-grade lymphoma. 2. Normal spleen. 3. Diffuse permeative pattern within the pelvis and spine. No hypermetabolic skeletal lesions."  #2 status post extensive bone involvement from marginal zone lymphoma #3 headaches without any red flags likely migrainous versus tension headaches.  Now much better controlled. #4 S/p Infectious conjunctivitis - related to chronic severe dry eyes. Being mx by her ophthalmologist.  Plan:  -Discussed lab results on 12/16/2022 in detail with patient. CBC stable, showed WBC of 2.3K, hemoglobin of 13.5,  and platelets of 225K. -Some leukopenia, no neutropenia -hemoglobin continues to improve -CMP normal -LDH normal -Discussed results of 11/25/2022 CT chest/abd/pelvis scan in detail which showed: 1. Continued interval decrease in size of bilateral axillary lymph nodes, consistent with treatment response. 2. Unchanged, matted lymph nodes and soft tissue in the retroperitoneum and pelvis. 3. No new evidence of lymphoma in the chest, abdomen, or pelvis. -discussed that there are some stable lymph nodes that continue to be prominent and it is difficult to know if this is related to residual scar tissue, though it does not behave similar to active disease.  -Patient has been tolerating maintenance Rituxan treatment well with no major toxicities -proceed with next cycle of maintenance Rituxan -not unreasonable to stop Rituxan after she receives her last treatment today and continue to monitor, which patient is agreeable with -will continue to monitor with labs in January 2025 -advised patient to stay UTD with age-appropriate vaccinations including influenza, Shingrix, pneumonia, and COVID-19 if she chooses.  -Continue D2 vitamin supplement -answered all of patient's questions in detail  Follow-up: Return to clinic with Dr. Candise Che with labs in the second week of January 2025  The total time spent in the appointment was 30 minutes* .  All of the patient's questions were answered with apparent satisfaction. The patient knows to call the clinic with any problems, questions or concerns.   Wyvonnia Lora MD MS AAHIVMS [facility] Hematology/Oncology Physician [facility]  .*Total Encounter Time as defined by the Centers for Medicare and Medicaid Services includes, in addition to the face-to-face time of a patient visit (documented in the note above) non-face-to-face time: obtaining and reviewing outside history, ordering and reviewing medications, tests or procedures, care coordination  (communications with other health care professionals or caregivers) and documentation in the medical record.    I,Dana Butler,acting as a Neurosurgeon for Wyvonnia Lora, MD.,have documented all relevant documentation on the behalf of Wyvonnia Lora, MD,as directed by  Wyvonnia Lora, MD while in the presence of  Wyvonnia Lora, MD.  .I have reviewed the above documentation for accuracy and completeness, and I agree with the above. Dana Maine MD

## 2022-12-16 NOTE — Progress Notes (Signed)
Patient seen by Dr. Kale  Vitals are within treatment parameters.  Labs reviewed: and are within treatment parameters.  Per physician team, patient is ready for treatment and there are NO modifications to the treatment plan.  

## 2022-12-17 ENCOUNTER — Other Ambulatory Visit: Payer: Self-pay | Admitting: Hematology

## 2022-12-17 DIAGNOSIS — C8580 Other specified types of non-Hodgkin lymphoma, unspecified site: Secondary | ICD-10-CM

## 2022-12-20 ENCOUNTER — Other Ambulatory Visit: Payer: Self-pay

## 2022-12-22 ENCOUNTER — Encounter: Payer: Self-pay | Admitting: Hematology

## 2023-01-26 ENCOUNTER — Other Ambulatory Visit: Payer: Self-pay

## 2023-05-03 ENCOUNTER — Other Ambulatory Visit: Payer: Self-pay

## 2023-05-03 DIAGNOSIS — C8580 Other specified types of non-Hodgkin lymphoma, unspecified site: Secondary | ICD-10-CM

## 2023-05-04 ENCOUNTER — Inpatient Hospital Stay: Payer: Medicare Other | Attending: Oncology

## 2023-05-04 ENCOUNTER — Inpatient Hospital Stay: Payer: Medicare Other | Admitting: Hematology

## 2023-05-05 ENCOUNTER — Other Ambulatory Visit: Payer: Self-pay | Admitting: Obstetrics and Gynecology

## 2023-05-05 DIAGNOSIS — Z0000 Encounter for general adult medical examination without abnormal findings: Secondary | ICD-10-CM

## 2023-05-06 ENCOUNTER — Other Ambulatory Visit: Payer: Self-pay

## 2023-05-20 ENCOUNTER — Other Ambulatory Visit: Payer: Self-pay

## 2023-05-31 ENCOUNTER — Ambulatory Visit
Admission: RE | Admit: 2023-05-31 | Discharge: 2023-05-31 | Disposition: A | Discharge status: Home / Self Care | Payer: Medicare Other | Source: Ambulatory Visit | Attending: Obstetrics and Gynecology | Admitting: Obstetrics and Gynecology

## 2023-05-31 DIAGNOSIS — Z0000 Encounter for general adult medical examination without abnormal findings: Secondary | ICD-10-CM

## 2023-06-07 ENCOUNTER — Other Ambulatory Visit: Payer: Self-pay | Admitting: Internal Medicine

## 2023-06-07 DIAGNOSIS — E785 Hyperlipidemia, unspecified: Secondary | ICD-10-CM

## 2023-06-09 ENCOUNTER — Other Ambulatory Visit: Payer: Self-pay

## 2023-06-21 ENCOUNTER — Inpatient Hospital Stay (HOSPITAL_BASED_OUTPATIENT_CLINIC_OR_DEPARTMENT_OTHER): Payer: Medicare Other | Admitting: Hematology

## 2023-06-21 ENCOUNTER — Inpatient Hospital Stay: Payer: Medicare Other | Attending: Hematology

## 2023-06-21 VITALS — BP 111/59 | HR 66 | Temp 97.5°F | Resp 17 | Wt 125.7 lb

## 2023-06-21 DIAGNOSIS — Z7952 Long term (current) use of systemic steroids: Secondary | ICD-10-CM | POA: Diagnosis not present

## 2023-06-21 DIAGNOSIS — R519 Headache, unspecified: Secondary | ICD-10-CM | POA: Insufficient documentation

## 2023-06-21 DIAGNOSIS — Z8041 Family history of malignant neoplasm of ovary: Secondary | ICD-10-CM | POA: Diagnosis not present

## 2023-06-21 DIAGNOSIS — Z803 Family history of malignant neoplasm of breast: Secondary | ICD-10-CM | POA: Insufficient documentation

## 2023-06-21 DIAGNOSIS — C8580 Other specified types of non-Hodgkin lymphoma, unspecified site: Secondary | ICD-10-CM

## 2023-06-21 DIAGNOSIS — R748 Abnormal levels of other serum enzymes: Secondary | ICD-10-CM | POA: Insufficient documentation

## 2023-06-21 DIAGNOSIS — H04129 Dry eye syndrome of unspecified lacrimal gland: Secondary | ICD-10-CM | POA: Diagnosis not present

## 2023-06-21 LAB — CBC WITH DIFFERENTIAL (CANCER CENTER ONLY)
Abs Immature Granulocytes: 0.01 10*3/uL (ref 0.00–0.07)
Basophils Absolute: 0.1 10*3/uL (ref 0.0–0.1)
Basophils Relative: 1 %
Eosinophils Absolute: 0.2 10*3/uL (ref 0.0–0.5)
Eosinophils Relative: 4 %
HCT: 37.5 % (ref 36.0–46.0)
Hemoglobin: 12.7 g/dL (ref 12.0–15.0)
Immature Granulocytes: 0 %
Lymphocytes Relative: 45 %
Lymphs Abs: 2.3 10*3/uL (ref 0.7–4.0)
MCH: 31.1 pg (ref 26.0–34.0)
MCHC: 33.9 g/dL (ref 30.0–36.0)
MCV: 91.9 fL (ref 80.0–100.0)
Monocytes Absolute: 0.3 10*3/uL (ref 0.1–1.0)
Monocytes Relative: 6 %
Neutro Abs: 2.3 10*3/uL (ref 1.7–7.7)
Neutrophils Relative %: 44 %
Platelet Count: 226 10*3/uL (ref 150–400)
RBC: 4.08 MIL/uL (ref 3.87–5.11)
RDW: 14.7 % (ref 11.5–15.5)
WBC Count: 5.2 10*3/uL (ref 4.0–10.5)
nRBC: 0 % (ref 0.0–0.2)

## 2023-06-21 LAB — CMP (CANCER CENTER ONLY)
ALT: 17 U/L (ref 0–44)
AST: 23 U/L (ref 15–41)
Albumin: 4 g/dL (ref 3.5–5.0)
Alkaline Phosphatase: 135 U/L — ABNORMAL HIGH (ref 38–126)
Anion gap: 3 — ABNORMAL LOW (ref 5–15)
BUN: 12 mg/dL (ref 8–23)
CO2: 28 mmol/L (ref 22–32)
Calcium: 9.1 mg/dL (ref 8.9–10.3)
Chloride: 106 mmol/L (ref 98–111)
Creatinine: 0.8 mg/dL (ref 0.44–1.00)
GFR, Estimated: >60 mL/min (ref >60)
Glucose, Bld: 96 mg/dL (ref 70–99)
Potassium: 4.1 mmol/L (ref 3.5–5.1)
Sodium: 137 mmol/L (ref 135–145)
Total Bilirubin: 0.4 mg/dL (ref 0.0–1.2)
Total Protein: 6.2 g/dL — ABNORMAL LOW (ref 6.5–8.1)

## 2023-06-21 LAB — LACTATE DEHYDROGENASE: LDH: 156 U/L (ref 98–192)

## 2023-06-21 NOTE — Progress Notes (Signed)
 HEMATOLOGY/ONCOLOGY CLINIC VISIT NOTE  Date of Service: 06/21/23   Patient Care Team: [name], DO as PCP - General (Internal Medicine) Flo Shanks, MD as Consulting Physician (Otolaryngology) [name], MD as Consulting Physician (General Surgery) [name], RD as Dietitian (Family Medicine)  CHIEF COMPLAINTS/PURPOSE OF CONSULTATION: Follow-up for continued management of her marginal zone lymphoma and next cycle of maintenance Rituxan  HISTORY OF PRESENTING ILLNESS:  Please see previous note for details on initial presentation  INTERVAL HISTORY:  Dana Butler is a 68 y.o. 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    Patient was last seen by me on 12/19/2022 and she was doing well overall.   Patient notes she has been doing well overall since our last visit. She denies any new infection issues, fever, chills, night sweats, unexpected weight loss, back pain, chest pain, abdominal pain, or leg swelling.   Patient complains of eye dryness at night, but denies eye infection.   MEDICAL HISTORY:  Past Medical History:  Diagnosis Date   Cancer (HCC) 2016   marginal zone non-hodgkins lymphoma   Medical history non-contributory     SURGICAL HISTORY: Past Surgical History:  Procedure Laterality Date   ABDOMINAL HYSTERECTOMY     AXILLARY LYMPH NODE BIOPSY Left 06/29/2014   Procedure: AXILLARY LYMPH NODE BIOPSY;  Surgeon: [name], MD;  Location: [facility];  Service: General;  Laterality: Left;   COLONOSCOPY     DIAGNOSTIC LAPAROSCOPY  2005   BSO   MASS EXCISION N/A 02/23/2019   Procedure: Excision of umbilicus skin lesion;  Surgeon: Peggye Form, DO;  Location: [facility];  Service: Plastics;  Laterality: N/A;    SOCIAL HISTORY: Social History   Socioeconomic History   Marital status: Married    Spouse name: Not on file   Number of children: Not on file   Years  of education: Not on file   Highest education level: Not on file  Occupational History   Not on file  Tobacco Use   Smoking status: Never   Smokeless tobacco: Never  Substance and Sexual Activity   Alcohol use: No   Drug use: No   Sexual activity: Not on file  Other Topics Concern   Not on file  Social History Narrative   Not on file   Social Drivers of Health   Financial Resource Strain: Not on file  Food Insecurity: Not on file  Transportation Needs: Not on file  Physical Activity: Not on file  Stress: Not on file  Social Connections: Not on file  Intimate Partner Violence: Not on file    FAMILY HISTORY: Family History  Problem Relation Age of Onset   Ovarian cancer Maternal Aunt 80   Breast cancer Paternal Aunt 43    ALLERGIES:  is allergic to chloraprep one step [chlorhexidine gluconate], latex, and tape.  MEDICATIONS:  Current Outpatient Medications  Medication Sig Dispense Refill   acyclovir (ZOVIRAX) 400 MG tablet Take 1 tablet (400 mg total) by mouth daily. (Patient not taking: Reported on 12/16/2022) 30 tablet 3   ALPRAZolam (XANAX) 0.5 MG tablet      calcium carbonate (OS-CAL) 1250 (500 Ca) MG chewable tablet Chew by mouth. (Patient not taking: Reported on 12/16/2022)     Cholecalciferol (VITAMIN D3) 5000 UNITS TABS Take by mouth.     dexamethasone (DECADRON) 4 MG tablet Take 2 tablets (8 mg total) by mouth daily. Take  2 tablets daily. Start the day after bendamustine chemotherapy for 2 days. Take with food. (Patient not taking: Reported on 06/12/2022) 60 tablet 1   erythromycin ophthalmic ointment Place 1 Application into the left eye at bedtime. (Patient not taking: Reported on 12/16/2022) 3.5 g 0   estradiol (VIVELLE-DOT) 0.05 MG/24HR patch Place 1 patch onto the skin 2 (two) times a week.     ferrous sulfate 325 (65 FE) MG EC tablet Take 1 tablet (325 mg total) by mouth daily with breakfast.  3   glucosamine-chondroitin 500-400 MG tablet Take by mouth.      Multiple Vitamins-Minerals (MULTIVITAMIN WITH MINERALS) tablet Take 1 tablet by mouth daily.     OMEGA-3 FATTY ACIDS-VITAMIN E PO 1,000 capsules. Take 2 capsules by mouth daily.     ondansetron (ZOFRAN) 8 MG tablet Take 1 tablet (8 mg total) by mouth 2 (two) times daily as needed for nausea or vomiting (2 times daily PRN for refractory nausea / vomiting). Start on day 2 after bendamustine chemo. (Patient not taking: Reported on 12/22/2021) 20 tablet 1   prochlorperazine (COMPAZINE) 10 MG tablet Take 1 tablet (10 mg total) by mouth every 6 (six) hours as needed for nausea or vomiting. (Patient not taking: Reported on 12/22/2021) 30 tablet 1   riTUXimab (RITUXAN) 100 MG/10ML injection See admin instructions.     Turmeric 450 MG CAPS as directed Orally     Vitamin D, Ergocalciferol, (DRISDOL) 1.25 MG (50000 UNIT) CAPS capsule TAKE 1 CAPSULE BY MOUTH ONE TIME PER WEEK 8 capsule 2   zaleplon (SONATA) 10 MG capsule Take 10 mg by mouth at bedtime.     No current facility-administered medications for this visit.    REVIEW OF SYSTEMS:    10 Point review of Systems was done is negative except as noted above.   PHYSICAL EXAMINATION:. Wt Readings from Last 3 Encounters:  12/16/22 131 lb 9 oz (59.7 kg)  10/14/22 124 lb 14.4 oz (56.7 kg)  08/14/22 124 lb 4.8 oz (56.4 kg)   Vitals:   06/21/23 1135  BP: (!) 111/59  Pulse: 66  Resp: 17  Temp: (!) 97.5 F (36.4 C)  SpO2: 100%    GENERAL:alert, in no acute distress and comfortable SKIN: no acute rashes, no significant lesions EYES: conjunctiva are pink and non-injected, sclera anicteric OROPHARYNX: MMM, no exudates, no oropharyngeal erythema or ulceration NECK: supple, no JVD LYMPH:  no palpable lymphadenopathy in the cervical, axillary or inguinal regions LUNGS: clear to auscultation b/l with normal respiratory effort HEART: regular rate & rhythm ABDOMEN:  normoactive bowel sounds , non tender, not distended. Extremity: no pedal edema PSYCH:  alert & oriented x 3 with fluent speech NEURO: no focal motor/sensory deficits   LABORATORY DATA:  I have reviewed the data as listed  .    Latest Ref Rng & Units 06/21/2023   11:14 AM 12/16/2022    8:46 AM 10/14/2022    8:47 AM  CBC  WBC 4.0 - 10.5 K/uL 5.2  2.3  2.3   Hemoglobin 12.0 - 15.0 g/dL 16.1  09.6  04.5   Hematocrit 36.0 - 46.0 % 37.5  38.4  38.9   Platelets 150 - 400 K/uL 226  225  233   ANC 1700  .    Latest Ref Rng & Units 06/21/2023   11:14 AM 12/16/2022    8:46 AM 10/14/2022    8:47 AM  CMP  Glucose 70 - 99 mg/dL 96  97  99  BUN 8 - 23 mg/dL 12  11  13    Creatinine 0.44 - 1.00 mg/dL 9.14  7.82  9.56   Sodium 135 - 145 mmol/L 137  137  138   Potassium 3.5 - 5.1 mmol/L 4.1  3.8  4.2   Chloride 98 - 111 mmol/L 106  102  106   CO2 22 - 32 mmol/L 28  30  26    Calcium 8.9 - 10.3 mg/dL 9.1  9.3  9.5   Total Protein 6.5 - 8.1 g/dL 6.2  6.3  6.3   Total Bilirubin 0.0 - 1.2 mg/dL 0.4  0.8  0.5   Alkaline Phos 38 - 126 U/L 135  89  133   AST 15 - 41 U/L 23  21  30    ALT 0 - 44 U/L 17  17  28      Lab Results  Component Value Date   LDH 156 06/21/2023     RADIOGRAPHIC STUDIES: I have personally reviewed the radiological images as listed and agreed with the findings in the report. MM 3D SCREENING MAMMOGRAM BILATERAL BREAST Result Date: 06/02/2023 CLINICAL DATA:  Screening. EXAM: DIGITAL SCREENING BILATERAL MAMMOGRAM WITH TOMOSYNTHESIS AND CAD TECHNIQUE: Bilateral screening digital craniocaudal and mediolateral oblique mammograms were obtained. Bilateral screening digital breast tomosynthesis was performed. The images were evaluated with computer-aided detection. COMPARISON:  Previous exam(s). ACR Breast Density Category b: There are scattered areas of fibroglandular density. FINDINGS: There are no findings suspicious for malignancy. IMPRESSION: No mammographic evidence of malignancy. A result letter of this screening mammogram will be mailed directly to the patient.  RECOMMENDATION: Screening mammogram in one year. (Code:SM-B-01Y) BI-RADS CATEGORY  1: Negative. Electronically Signed   By: Elberta Fortis M.D.   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 Initially diagnosed in 2016. Patient was diagnosed with nodal marginal zone lymphoma based on lymph node biopsy of her left axillary lymph node on 06/29/2014 and has been following with Dr. Darnelle Catalan for active surveillance and until recently did not meet criteria for consideration of treatment. In December 2019 on 04/12/2018 the patient had a biopsy of a right breast mass that showed pathology consistent with marginal zone lymphoma.  Patient over the last year or so has had increasing WBC counts which were as high as 91.2k in December 2022.  Patient also noted to have some anemia with hemoglobin of 11 and normal platelet counts of 234k in January 2023.  She also started developing some lymphadenopathy in the neck.  Patient is status post 4 cycles of Bendamustine Rituxan and is on maintenance Rituxan at this time every 2 months  PET CT scan done 09/08/2021 revealed "1. Bulky axillary adenopathy periaortic adenopathy and iliac adenopathy with very low metabolic activity ( Deauville 2). Findings consistent with low-grade lymphoma. 2. Normal spleen. 3. Diffuse permeative pattern within the pelvis and spine. No hypermetabolic skeletal lesions."  #2 status post extensive bone involvement from marginal zone lymphoma #3 headaches without any red flags likely migrainous versus tension headaches.  Now much better controlled. #4 S/p Infectious conjunctivitis - related to chronic severe dry eyes. Being mx by her ophthalmologist.  Plan:  -Discussed lab results from today, 06/21/2023, in detail with the patient. CBC stable. CMP shows elevated Alkaline phosphate level of 135, but stable overall.  LDH . Lab Results  Component Value Date    LDH 156 06/21/2023   -advised patient to  stay UTD with age-appropriate vaccinations including influenza, Shingrix, pneumonia, and COVID-19 if she chooses.  -Continue D2 vitamin supplement -answered all of patient's questions in detail -Discussed with the patient that we will most-likely get an rpt CT scan in 2026 unless there are new symptoms.   Follow-up: RTC with Dr Candise Che with labs in 6 months  The total time spent in the appointment was 21 minutes* .  All of the patient's questions were answered with apparent satisfaction. The patient knows to call the clinic with any problems, questions or concerns.   Wyvonnia Lora MD MS AAHIVMS [facility] Hematology/Oncology Physician [facility]  .*Total Encounter Time as defined by the Centers for Medicare and Medicaid Services includes, in addition to the face-to-face time of a patient visit (documented in the note above) non-face-to-face time: obtaining and reviewing outside history, ordering and reviewing medications, tests or procedures, care coordination (communications with other health care professionals or caregivers) and documentation in the medical record.   I,Param Shah,acting as a Neurosurgeon for Wyvonnia Lora, MD.,have documented all relevant documentation on the behalf of Wyvonnia Lora, MD,as directed by  Wyvonnia Lora, MD while in the presence of Wyvonnia Lora, MD.  .I have reviewed the above documentation for accuracy and completeness, and I agree with the above. Johney Maine MD

## 2023-06-22 ENCOUNTER — Telehealth: Payer: Self-pay | Admitting: Hematology

## 2023-06-22 NOTE — Telephone Encounter (Signed)
 Spoke with patient confirming upcoming appointment

## 2023-06-23 ENCOUNTER — Other Ambulatory Visit: Payer: Self-pay

## 2023-06-25 ENCOUNTER — Other Ambulatory Visit: Payer: Self-pay

## 2023-06-27 ENCOUNTER — Encounter: Payer: Self-pay | Admitting: Hematology

## 2023-07-06 ENCOUNTER — Ambulatory Visit
Admission: RE | Admit: 2023-07-06 | Discharge: 2023-07-06 | Disposition: A | Discharge status: Home / Self Care | Payer: Medicare Other | Source: Ambulatory Visit | Attending: Internal Medicine

## 2023-07-06 ENCOUNTER — Encounter: Payer: Self-pay | Admitting: Hematology

## 2023-07-06 DIAGNOSIS — E785 Hyperlipidemia, unspecified: Secondary | ICD-10-CM

## 2023-12-16 ENCOUNTER — Other Ambulatory Visit: Payer: Self-pay

## 2023-12-16 DIAGNOSIS — C8580 Other specified types of non-Hodgkin lymphoma, unspecified site: Secondary | ICD-10-CM

## 2023-12-16 DIAGNOSIS — M810 Age-related osteoporosis without current pathological fracture: Secondary | ICD-10-CM

## 2023-12-16 DIAGNOSIS — Z131 Encounter for screening for diabetes mellitus: Secondary | ICD-10-CM

## 2023-12-20 ENCOUNTER — Inpatient Hospital Stay (HOSPITAL_BASED_OUTPATIENT_CLINIC_OR_DEPARTMENT_OTHER): Payer: Medicare Other | Admitting: Hematology

## 2023-12-20 ENCOUNTER — Inpatient Hospital Stay: Payer: Medicare Other | Attending: Hematology

## 2023-12-20 ENCOUNTER — Other Ambulatory Visit: Payer: Self-pay

## 2023-12-20 VITALS — BP 129/69 | HR 81 | Temp 97.5°F | Resp 18 | Wt 105.2 lb

## 2023-12-20 DIAGNOSIS — Z9221 Personal history of antineoplastic chemotherapy: Secondary | ICD-10-CM | POA: Diagnosis not present

## 2023-12-20 DIAGNOSIS — R7989 Other specified abnormal findings of blood chemistry: Secondary | ICD-10-CM | POA: Diagnosis not present

## 2023-12-20 DIAGNOSIS — Z91040 Latex allergy status: Secondary | ICD-10-CM | POA: Diagnosis not present

## 2023-12-20 DIAGNOSIS — C8580 Other specified types of non-Hodgkin lymphoma, unspecified site: Secondary | ICD-10-CM

## 2023-12-20 DIAGNOSIS — M810 Age-related osteoporosis without current pathological fracture: Secondary | ICD-10-CM

## 2023-12-20 DIAGNOSIS — Z79624 Long term (current) use of inhibitors of nucleotide synthesis: Secondary | ICD-10-CM | POA: Diagnosis not present

## 2023-12-20 DIAGNOSIS — Z803 Family history of malignant neoplasm of breast: Secondary | ICD-10-CM | POA: Insufficient documentation

## 2023-12-20 DIAGNOSIS — D72820 Lymphocytosis (symptomatic): Secondary | ICD-10-CM | POA: Insufficient documentation

## 2023-12-20 DIAGNOSIS — Z131 Encounter for screening for diabetes mellitus: Secondary | ICD-10-CM

## 2023-12-20 DIAGNOSIS — Z90722 Acquired absence of ovaries, bilateral: Secondary | ICD-10-CM | POA: Diagnosis not present

## 2023-12-20 DIAGNOSIS — Z7952 Long term (current) use of systemic steroids: Secondary | ICD-10-CM | POA: Diagnosis not present

## 2023-12-20 DIAGNOSIS — C8309 Small cell B-cell lymphoma, extranodal and solid organ sites: Secondary | ICD-10-CM | POA: Insufficient documentation

## 2023-12-20 DIAGNOSIS — Z90710 Acquired absence of both cervix and uterus: Secondary | ICD-10-CM | POA: Insufficient documentation

## 2023-12-20 DIAGNOSIS — Z8041 Family history of malignant neoplasm of ovary: Secondary | ICD-10-CM | POA: Diagnosis not present

## 2023-12-20 DIAGNOSIS — R5383 Other fatigue: Secondary | ICD-10-CM | POA: Diagnosis not present

## 2023-12-20 DIAGNOSIS — R519 Headache, unspecified: Secondary | ICD-10-CM | POA: Insufficient documentation

## 2023-12-20 LAB — CMP (CANCER CENTER ONLY)
ALT: 18 U/L (ref 0–44)
AST: 30 U/L (ref 15–41)
Albumin: 4.2 g/dL (ref 3.5–5.0)
Alkaline Phosphatase: 178 U/L — ABNORMAL HIGH (ref 38–126)
Anion gap: 4 — ABNORMAL LOW (ref 5–15)
BUN: 21 mg/dL (ref 8–23)
CO2: 31 mmol/L (ref 22–32)
Calcium: 13.5 mg/dL (ref 8.9–10.3)
Chloride: 104 mmol/L (ref 98–111)
Creatinine: 0.97 mg/dL (ref 0.44–1.00)
GFR, Estimated: >60 mL/min (ref >60)
Glucose, Bld: 88 mg/dL (ref 70–99)
Potassium: 3.5 mmol/L (ref 3.5–5.1)
Sodium: 139 mmol/L (ref 135–145)
Total Bilirubin: 0.7 mg/dL (ref 0.0–1.2)
Total Protein: 6 g/dL — ABNORMAL LOW (ref 6.5–8.1)

## 2023-12-20 LAB — CBC WITH DIFFERENTIAL (CANCER CENTER ONLY)
Abs Immature Granulocytes: 0.1 K/uL — ABNORMAL HIGH (ref 0.00–0.07)
Basophils Absolute: 0.2 K/uL — ABNORMAL HIGH (ref 0.0–0.1)
Basophils Relative: 0 %
Eosinophils Absolute: 0.2 K/uL (ref 0.0–0.5)
Eosinophils Relative: 0 %
HCT: 32.6 % — ABNORMAL LOW (ref 36.0–46.0)
Hemoglobin: 11.2 g/dL — ABNORMAL LOW (ref 12.0–15.0)
Immature Granulocytes: 0 %
Lymphocytes Relative: 87 %
Lymphs Abs: 38 K/uL — ABNORMAL HIGH (ref 0.7–4.0)
MCH: 31.3 pg (ref 26.0–34.0)
MCHC: 34.4 g/dL (ref 30.0–36.0)
MCV: 91.1 fL (ref 80.0–100.0)
Monocytes Absolute: 1.3 K/uL — ABNORMAL HIGH (ref 0.1–1.0)
Monocytes Relative: 3 %
Neutro Abs: 4.1 K/uL (ref 1.7–7.7)
Neutrophils Relative %: 10 %
Platelet Count: 184 K/uL (ref 150–400)
RBC: 3.58 MIL/uL — ABNORMAL LOW (ref 3.87–5.11)
RDW: 14.7 % (ref 11.5–15.5)
Smear Review: NORMAL
WBC Count: 43.9 K/uL — ABNORMAL HIGH (ref 4.0–10.5)
nRBC: 0 % (ref 0.0–0.2)

## 2023-12-20 LAB — VITAMIN B12: Vitamin B-12: 1679 pg/mL — ABNORMAL HIGH (ref 180–914)

## 2023-12-20 LAB — LACTATE DEHYDROGENASE: LDH: 177 U/L (ref 98–192)

## 2023-12-20 LAB — VITAMIN D 25 HYDROXY (VIT D DEFICIENCY, FRACTURES): Vit D, 25-Hydroxy: 110.27 ng/mL — ABNORMAL HIGH (ref 30–100)

## 2023-12-20 LAB — HEMOGLOBIN A1C
Hgb A1c MFr Bld: 4.8 % (ref 4.8–5.6)
Mean Plasma Glucose: 91.06 mg/dL

## 2023-12-28 ENCOUNTER — Encounter: Payer: Self-pay | Admitting: Hematology

## 2023-12-28 NOTE — Progress Notes (Signed)
 HEMATOLOGY/ONCOLOGY CLINIC VISIT NOTE  Date of Service: .12/20/2023   Patient Care Team: [name], DO as PCP - General (Internal Medicine) [name], MD as Consulting Physician (Otolaryngology) [name], MD as Consulting Physician (General Surgery) [name], RD as Dietitian (Family Medicine)  CHIEF COMPLAINTS/PURPOSE OF CONSULTATION: Follow-up for continued evaluation and management of nodal marginal zone lymphoma  HISTORY OF PRESENTING ILLNESS:  Please see previous note for details on initial presentation  INTERVAL HISTORY:  Dana Butler is a 68 y.o. female is here for continued evaluation management of of her nodal marginal zone lymphoma. She notes that she has been feeling progressively fatigued over the last several months. She has been on a GLP-1 medication and lost about 15 pounds over the last year.  She did this primarily to lose weight prior to her family wedding this summer which went very well. She has continued to have poor appetite. Labs today show concerns for progression of her underlying lymphoma. Patient notes no fevers no chills no night sweats no unexpected weight loss.  No new obviously palpable lumps or bumps.  MEDICAL HISTORY:  Past Medical History:  Diagnosis Date   Cancer (HCC) 2016   marginal zone non-hodgkins lymphoma   Medical history non-contributory     SURGICAL HISTORY: Past Surgical History:  Procedure Laterality Date   ABDOMINAL HYSTERECTOMY     AXILLARY LYMPH NODE BIOPSY Left 06/29/2014   Procedure: AXILLARY LYMPH NODE BIOPSY;  Surgeon: Boas Eletha, MD;  Location: [facility];  Service: General;  Laterality: Left;   COLONOSCOPY     DIAGNOSTIC LAPAROSCOPY  2005   BSO   MASS EXCISION N/A 02/23/2019   Procedure: Excision of umbilicus skin lesion;  Surgeon: Lowery Estefana RAMAN, DO;  Location:  [facility];  Service: Plastics;  Laterality: N/A;    SOCIAL HISTORY: Social History    Socioeconomic History   Marital status: Married    Spouse name: Not on file   Number of children: Not on file   Years of education: Not on file   Highest education level: Not on file  Occupational History   Not on file  Tobacco Use   Smoking status: Never   Smokeless tobacco: Never  Substance and Sexual Activity   Alcohol use: No   Drug use: No   Sexual activity: Not on file  Other Topics Concern   Not on file  Social History Narrative   Not on file   Social Drivers of Health   Financial Resource Strain: Low Risk  (06/30/2023)   Received from [facility]   Overall Financial Resource Strain (CARDIA)    Difficulty of Paying Living Expenses: Not very hard  Food Insecurity: No Food Insecurity (06/30/2023)   Received from [facility]   Hunger Vital Sign    Within the past 12 months, you worried that your food would run out before you got the money to buy more.: Never true    Within the past 12 months, the food you bought just didn't last and you didn't have money to get more.: Never true  Transportation Needs: No Transportation Needs (06/30/2023)   Received from [facility] - Transportation    Lack of Transportation (Medical): No    Lack of Transportation (Non-Medical): No  Physical Activity: Not on file  Stress: Not on file  Social Connections: Not on file  Intimate Partner Violence: Not on file    FAMILY HISTORY: Family History  Problem Relation Age  of Onset   Ovarian cancer Maternal Aunt 68   Breast cancer Paternal Aunt 69    ALLERGIES:  is allergic to chloraprep one step [chlorhexidine  gluconate], latex, and tape.  MEDICATIONS:  Current Outpatient Medications  Medication Sig Dispense Refill   ALPRAZolam (XANAX) 0.5 MG tablet      Cholecalciferol (VITAMIN D3) 5000 UNITS TABS Take by mouth.     estradiol (VIVELLE-DOT) 0.05 MG/24HR patch Place 1 patch onto the skin 2 (two) times a week.     ferrous sulfate  325 (65 FE) MG EC tablet Take 1 tablet (325  mg total) by mouth daily with breakfast.  3   glucosamine-chondroitin 500-400 MG tablet Take by mouth.     Multiple Vitamins-Minerals (MULTIVITAMIN WITH MINERALS) tablet Take 1 tablet by mouth daily.     OMEGA-3 FATTY ACIDS-VITAMIN E PO 1,000 capsules. Take 2 capsules by mouth daily.     Turmeric 450 MG CAPS as directed Orally     Vitamin D , Ergocalciferol , (DRISDOL ) 1.25 MG (50000 UNIT) CAPS capsule TAKE 1 CAPSULE BY MOUTH ONE TIME PER WEEK 8 capsule 2   zaleplon (SONATA) 10 MG capsule Take 10 mg by mouth at bedtime.     acyclovir  (ZOVIRAX ) 400 MG tablet Take 1 tablet (400 mg total) by mouth daily. (Patient not taking: Reported on 12/20/2023) 30 tablet 3   calcium carbonate (OS-CAL) 1250 (500 Ca) MG chewable tablet Chew by mouth. (Patient not taking: Reported on 12/16/2022)     dexamethasone  (DECADRON ) 4 MG tablet Take 2 tablets (8 mg total) by mouth daily. Take 2 tablets daily. Start the day after bendamustine  chemotherapy for 2 days. Take with food. (Patient not taking: Reported on 12/20/2023) 60 tablet 1   erythromycin  ophthalmic ointment Place 1 Application into the left eye at bedtime. (Patient not taking: Reported on 12/20/2023) 3.5 g 0   ondansetron  (ZOFRAN ) 8 MG tablet Take 1 tablet (8 mg total) by mouth 2 (two) times daily as needed for nausea or vomiting (2 times daily PRN for refractory nausea / vomiting). Start on day 2 after bendamustine  chemo. (Patient not taking: Reported on 12/20/2023) 20 tablet 1   prochlorperazine  (COMPAZINE ) 10 MG tablet Take 1 tablet (10 mg total) by mouth every 6 (six) hours as needed for nausea or vomiting. (Patient not taking: Reported on 12/20/2023) 30 tablet 1   riTUXimab  (RITUXAN ) 100 MG/10ML injection See admin instructions. (Patient not taking: Reported on 12/20/2023)     No current facility-administered medications for this visit.    REVIEW OF SYSTEMS:    10 Point review of Systems was done is negative except as noted above.   PHYSICAL EXAMINATION:. Wt  Readings from Last 3 Encounters:  12/20/23 105 lb 4 oz (47.7 kg)  06/21/23 125 lb 11.2 oz (57 kg)  12/16/22 131 lb 9 oz (59.7 kg)   Vitals:   12/20/23 1300  BP: 129/69  Pulse: 81  Resp: 18  Temp: (!) 97.5 F (36.4 C)  SpO2: 96%    GENERAL:alert, in no acute distress and comfortable SKIN: no acute rashes, no significant lesions EYES: conjunctiva are pink and non-injected, sclera anicteric OROPHARYNX: MMM, no exudates, no oropharyngeal erythema or ulceration NECK: supple, no JVD LYMPH:  no palpable lymphadenopathy in the cervical, axillary or inguinal regions LUNGS: clear to auscultation b/l with normal respiratory effort HEART: regular rate & rhythm ABDOMEN:  normoactive bowel sounds , non tender, not distended. Extremity: no pedal edema PSYCH: alert & oriented x 3 with fluent speech NEURO: no focal  motor/sensory deficits   LABORATORY DATA:  I have reviewed the data as listed  .    Latest Ref Rng & Units 12/20/2023   11:38 AM 06/21/2023   11:14 AM 12/16/2022    8:46 AM  CBC  WBC 4.0 - 10.5 K/uL 43.9  5.2  2.3   Hemoglobin 12.0 - 15.0 g/dL 88.7  87.2  86.4   Hematocrit 36.0 - 46.0 % 32.6  37.5  38.4   Platelets 150 - 400 K/uL 184  226  225   ANC 1700  .    Latest Ref Rng & Units 12/20/2023   11:38 AM 06/21/2023   11:14 AM 12/16/2022    8:46 AM  CMP  Glucose 70 - 99 mg/dL 88  96  97   BUN 8 - 23 mg/dL 21  12  11    Creatinine 0.44 - 1.00 mg/dL 9.02  9.19  9.31   Sodium 135 - 145 mmol/L 139  137  137   Potassium 3.5 - 5.1 mmol/L 3.5  4.1  3.8   Chloride 98 - 111 mmol/L 104  106  102   CO2 22 - 32 mmol/L 31  28  30    Calcium 8.9 - 10.3 mg/dL 86.4  9.1  9.3   Total Protein 6.5 - 8.1 g/dL 6.0  6.2  6.3   Total Bilirubin 0.0 - 1.2 mg/dL 0.7  0.4  0.8   Alkaline Phos 38 - 126 U/L 178  135  89   AST 15 - 41 U/L 30  23  21    ALT 0 - 44 U/L 18  17  17     Component     Latest Ref Rng 12/20/2023  LDH     98 - 192 U/L 177   Vitamin D , 25-Hydroxy     30 - 100 ng/mL  110.27 (H)   Vitamin B12     180 - 914 pg/mL 1,679 (H)     Legend: (H) High Lab Results  Component Value Date   LDH 177 12/20/2023     RADIOGRAPHIC STUDIES: I have personally reviewed the radiological images as listed and agreed with the findings in the report. No results found.   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 Initially diagnosed in 2016. Patient was diagnosed with nodal marginal zone lymphoma based on lymph node biopsy of her left axillary lymph node on 06/29/2014 and has been following with Dr. Magrinat for active surveillance and until recently did not meet criteria for consideration of treatment. In December 2019 on 04/12/2018 the patient had a biopsy of a right breast mass that showed pathology consistent with marginal zone lymphoma.  Patient over the last year or so has had increasing WBC counts which were as high as 91.2k in December 2022.  Patient also noted to have some anemia with hemoglobin of 11 and normal platelet counts of 234k in January 2023.  She also started developing some lymphadenopathy in the neck.  Patient is status post 4 cycles of Bendamustine  Rituxan  and is on maintenance Rituxan  at this time every 2 months  PET CT scan done 09/08/2021 revealed 1. Bulky axillary adenopathy periaortic adenopathy and iliac adenopathy with very low metabolic activity ( Deauville 2). Findings consistent with low-grade lymphoma. 2. Normal spleen. 3. Diffuse permeative pattern within the pelvis and spine. No hypermetabolic skeletal lesions.  #2 status post extensive bone involvement from marginal zone lymphoma #3 headaches without any red  flags likely migrainous versus tension headaches.  Now much better controlled. #4 S/p Infectious conjunctivitis - related to chronic severe dry eyes. Being mx by her ophthalmologist.  Plan: Discussed lab results from today 12/20/2023 in detail with the patient CBC  shows significant increase in WBC count to 43.9k with lymphocyte count of 38k hemoglobin of 11.2 and platelets of 184k LDH within normal limits at 177 CMP shows elevated calcium level of 13.5 with elevated alkaline phosphatase of 178 normal creatinine of 0.97. 25-hydroxy vitamin D  levels elevated at 110 B12 at 1679. - Discussed that based on labs she is having overt relapse of her marginal zone lymphoma. We have sent out a flow cytometry to confirm that her lymphocytosis is indeed from her relapsed marginal zone lymphoma. Will get a PET CT scan to reevaluate the status of her disease and progression. Hypercalcemia is related to her elevated vitamin D  levels, recurrent lymphoma and possible cautious lymphoma. - Would recommend holding her vitamin D  and drinking at least 2 L of water a day. Will also place her on prednisone  60 mg daily for 6 days Might need to start her on Xgeva or Zometa to address hypercalcemia if needed. - Discussed treatment options for her relapsed marginal zone lymphoma which is relapsed within a year of completion of her maintenance Rituxan  and about 2 years from her Bendamustine  Rituxan . - We discussed different treatment options and recommended consideration of Zanubrutinib .  She was given a paper to read about Zanubrutinib  and will get back to us  on this. - Discussed that with her weight loss and fatigue would recommend discontinuing her GLP-1 treatment at this time.  Follow-up: PET CT scan as soon as possible  Repeat labs in 7 to 10 days  .The total time spent in the appointment was 40 minutes* .  All of the patient's questions were answered with apparent satisfaction. The patient knows to call the clinic with any problems, questions or concerns.   Emaline Saran MD MS AAHIVMS [facility] Hematology/Oncology Physician [facility]  .*Total Encounter Time as defined by the Centers for Medicare and Medicaid Services includes, in addition to the face-to-face  time of a patient visit (documented in the note above) non-face-to-face time: obtaining and reviewing outside history, ordering and reviewing medications, tests or procedures, care coordination (communications with other health care professionals or caregivers) and documentation in the medical record.

## 2023-12-28 NOTE — Progress Notes (Signed)
 Pt contacted per Dr Onesimo. Pt to drink 2 to 3 liters of water daily and to come in this week for repeat labs. Pt agreed. Pt informed that she will have a steroid prescription sent in to take. Pt acknowledged information.

## 2023-12-29 ENCOUNTER — Other Ambulatory Visit: Payer: Self-pay

## 2023-12-29 DIAGNOSIS — C8580 Other specified types of non-Hodgkin lymphoma, unspecified site: Secondary | ICD-10-CM

## 2023-12-29 MED ORDER — PREDNISONE 20 MG PO TABS
60.0000 mg | ORAL_TABLET | Freq: Every day | ORAL | 0 refills | Status: AC
Start: 2023-12-29 — End: ?

## 2023-12-30 ENCOUNTER — Other Ambulatory Visit: Payer: Self-pay | Admitting: Hematology

## 2023-12-30 ENCOUNTER — Other Ambulatory Visit

## 2023-12-30 ENCOUNTER — Other Ambulatory Visit: Payer: Self-pay

## 2023-12-30 ENCOUNTER — Inpatient Hospital Stay: Attending: Hematology

## 2023-12-30 ENCOUNTER — Other Ambulatory Visit (HOSPITAL_COMMUNITY): Payer: Self-pay

## 2023-12-30 ENCOUNTER — Telehealth: Payer: Self-pay

## 2023-12-30 ENCOUNTER — Encounter: Payer: Self-pay | Admitting: Hematology

## 2023-12-30 DIAGNOSIS — C8580 Other specified types of non-Hodgkin lymphoma, unspecified site: Secondary | ICD-10-CM

## 2023-12-30 DIAGNOSIS — C8309 Small cell B-cell lymphoma, extranodal and solid organ sites: Secondary | ICD-10-CM | POA: Diagnosis present

## 2023-12-30 LAB — CMP (CANCER CENTER ONLY)
ALT: 28 U/L (ref 0–44)
AST: 33 U/L (ref 15–41)
Albumin: 3.8 g/dL (ref 3.5–5.0)
Alkaline Phosphatase: 198 U/L — ABNORMAL HIGH (ref 38–126)
Anion gap: 2 — ABNORMAL LOW (ref 5–15)
BUN: 9 mg/dL (ref 8–23)
CO2: 29 mmol/L (ref 22–32)
Calcium: 10.2 mg/dL (ref 8.9–10.3)
Chloride: 106 mmol/L (ref 98–111)
Creatinine: 0.76 mg/dL (ref 0.44–1.00)
GFR, Estimated: >60 mL/min (ref >60)
Glucose, Bld: 97 mg/dL (ref 70–99)
Potassium: 4.3 mmol/L (ref 3.5–5.1)
Sodium: 137 mmol/L (ref 135–145)
Total Bilirubin: 0.5 mg/dL (ref 0.0–1.2)
Total Protein: 5.9 g/dL — ABNORMAL LOW (ref 6.5–8.1)

## 2023-12-30 LAB — CBC WITH DIFFERENTIAL (CANCER CENTER ONLY)
Abs Immature Granulocytes: 0.11 K/uL — ABNORMAL HIGH (ref 0.00–0.07)
Basophils Absolute: 0.1 K/uL (ref 0.0–0.1)
Basophils Relative: 0 %
Eosinophils Absolute: 0.1 K/uL (ref 0.0–0.5)
Eosinophils Relative: 0 %
HCT: 32.3 % — ABNORMAL LOW (ref 36.0–46.0)
Hemoglobin: 11 g/dL — ABNORMAL LOW (ref 12.0–15.0)
Immature Granulocytes: 0 %
Lymphocytes Relative: 88 %
Lymphs Abs: 41.4 K/uL — ABNORMAL HIGH (ref 0.7–4.0)
MCH: 31.5 pg (ref 26.0–34.0)
MCHC: 34.1 g/dL (ref 30.0–36.0)
MCV: 92.6 fL (ref 80.0–100.0)
Monocytes Absolute: 0.7 K/uL (ref 0.1–1.0)
Monocytes Relative: 2 %
Neutro Abs: 4.4 K/uL (ref 1.7–7.7)
Neutrophils Relative %: 10 %
Platelet Count: 231 K/uL (ref 150–400)
RBC: 3.49 MIL/uL — ABNORMAL LOW (ref 3.87–5.11)
RDW: 15.6 % — ABNORMAL HIGH (ref 11.5–15.5)
Smear Review: NORMAL
WBC Count: 46.8 K/uL — ABNORMAL HIGH (ref 4.0–10.5)
nRBC: 0 % (ref 0.0–0.2)

## 2023-12-30 LAB — SURGICAL PATHOLOGY

## 2023-12-30 LAB — FLOW CYTOMETRY

## 2023-12-30 MED ORDER — ZANUBRUTINIB 80 MG PO CAPS
160.0000 mg | ORAL_CAPSULE | Freq: Two times a day (BID) | ORAL | 2 refills | Status: DC
  Filled 2024-01-10: qty 120, 30d supply, fill #0
  Filled 2024-02-03: qty 120, 30d supply, fill #1
  Filled 2024-02-25: qty 120, 30d supply, fill #2

## 2023-12-30 MED ORDER — ZANUBRUTINIB 80 MG PO CAPS: 160.0000 mg | ORAL_CAPSULE | Freq: Two times a day (BID) | ORAL | 2 refills | Status: DC

## 2023-12-30 NOTE — Progress Notes (Signed)
 Patient agreed to proceed with Zanubrutinib  for treatment of her relapse Stage IV nodal marginal zone lymphoma. Prescription sent to [facility].

## 2023-12-30 NOTE — Telephone Encounter (Signed)
 Oral Oncology Patient Advocate Encounter   Received notification that prior authorization for zanubrutinib  (BRUKINSA ) 80 MG capsule  is required.   PA submitted on 12/30/23 Key [id number] Status is pending     [name], CPhT [place]  [facility] Specialty Pharmacy Services Pharmacy Technician Patient Advocate Specialist II [name] Phone: [phone]  Fax: [phone] [email or web address]

## 2023-12-30 NOTE — Telephone Encounter (Signed)
 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  Oral Chemo Regimen  Medication/dosing schedule: Brukinsa  (zanubrutinib )160 mg (2 capsules) by mouth twice daily  REMs program: No  Prescription dose and frequency assessed: Yes   Labs/Baseline Monitoring   Required baseline labs: CBC, CMP  Labs from 12/30/2023 assessed, Alkaline Phosphatase chronically elevated, WBC 46.8 (H), Hgb 11.0.  Renal/hepatic function: Renal WNL; hepatic elevated Alk Phos, LFTs WNL Plan: No dose adjustment indicated at this time due to the elevated Alk Phos   Current Medications  Current medication list reviewed in Epic 1 relevant DDI associated with Brukinsa  (zanubrutinib ) identified Assessment: Category B: Alprazolam, many increase serum concentrations of Zanubrutinib ; no action required beyond standard care measures   Supportive Care Needs No: Nausea/Vomiting Low risk to minimal anti-emetic regimen, could consider PRN ondansetron  or compazine  No: TLS Not indicated for this treatment regimen   Infection/VTE ppx Needs No: HSV ppx Recommended but not required  No: VTE ppx:  Not indicated for this treatment regimen  Logistics and Coordination  Evaluated chart and no patient barriers to medication adherence identified.  Prescription has been e-scribed to the [facility] for benefits analysis and approval. Oral Oncology Clinic will continue to follow for insurance authorization, copayment issues, initial counseling and start date.  Thank you for allowing pharmacy to participate in this patient's care.  Alfonso MARLA Buys, PharmD Pharmacy Resident  12/30/2023 2:29 PM

## 2023-12-31 ENCOUNTER — Telehealth: Payer: Self-pay

## 2023-12-31 ENCOUNTER — Other Ambulatory Visit (HOSPITAL_COMMUNITY): Payer: Self-pay

## 2023-12-31 NOTE — Telephone Encounter (Signed)
 Oral Oncology Patient Advocate Encounter  Prior Authorization for Brukinsa  has been approved.    PA# [id number] Effective dates: 12/16/23 through further notice.  Patients co-pay is $1,396.78.  Trying to get PAP.  [name], CPhT [place]  [facility] Specialty Pharmacy Services Pharmacy Technician Patient Advocate Specialist II [name] Phone: [phone]  Fax: [phone] [email or web address]

## 2024-01-01 ENCOUNTER — Other Ambulatory Visit: Payer: Self-pay

## 2024-01-03 ENCOUNTER — Other Ambulatory Visit

## 2024-01-03 NOTE — Telephone Encounter (Addendum)
 Oral Oncology Patient Advocate Encounter   Began application for assistance for Brukinsa  through [employer]: Her Case ID# [id number]   Application will be submitted upon completion of necessary supporting documentation.   [employer] phone number [phone].   I will continue to check the status until final determination.   [name], CPhT [place]  [facility] Specialty Pharmacy Services Pharmacy Technician Patient Advocate Specialist II [name] Phone: [phone]  Fax: [phone] [email or web address]

## 2024-01-05 NOTE — Telephone Encounter (Signed)
 Oral Oncology Patient Advocate Encounter  Contacted patient to get income verification. [employer] did a soft check and the amount was more than what was put on the application, but still should be approved for assistance. I just need verification sent in.  [name], CPhT [place]  [facility] Specialty Pharmacy Services Pharmacy Technician Patient Advocate Specialist II [name] Phone: [phone]  Fax: [phone] [email or web address]

## 2024-01-06 ENCOUNTER — Inpatient Hospital Stay

## 2024-01-06 ENCOUNTER — Encounter (HOSPITAL_COMMUNITY)

## 2024-01-06 ENCOUNTER — Encounter (HOSPITAL_COMMUNITY): Payer: Self-pay

## 2024-01-06 ENCOUNTER — Other Ambulatory Visit

## 2024-01-07 ENCOUNTER — Other Ambulatory Visit (HOSPITAL_COMMUNITY): Payer: Self-pay

## 2024-01-07 ENCOUNTER — Ambulatory Visit (HOSPITAL_COMMUNITY)

## 2024-01-07 ENCOUNTER — Other Ambulatory Visit

## 2024-01-07 NOTE — Telephone Encounter (Signed)
 Oral Oncology Patient Advocate Encounter  She has decided to not go the grant route, she makes too much to get approved. Izetta will speak with her Monday to go over medication and I will get her onboarded.  [name], CPhT [place]  [facility] Specialty Pharmacy Services Pharmacy Technician Patient Advocate Specialist II [name] Phone: [phone]  Fax: [phone] [email or web address]

## 2024-01-10 ENCOUNTER — Other Ambulatory Visit: Payer: Self-pay

## 2024-01-10 ENCOUNTER — Other Ambulatory Visit (HOSPITAL_COMMUNITY): Payer: Self-pay

## 2024-01-10 ENCOUNTER — Ambulatory Visit (INDEPENDENT_AMBULATORY_CARE_PROVIDER_SITE_OTHER)
Admission: RE | Admit: 2024-01-10 | Discharge: 2024-01-10 | Disposition: A | Discharge status: Home / Self Care | Source: Ambulatory Visit | Attending: Hematology | Admitting: Hematology

## 2024-01-10 DIAGNOSIS — C8580 Other specified types of non-Hodgkin lymphoma, unspecified site: Secondary | ICD-10-CM

## 2024-01-10 MED ORDER — FLUDEOXYGLUCOSE F - 18 (FDG) INJECTION
7.7300 | Freq: Once | INTRAVENOUS | Status: AC | PRN
  Administered 2024-01-10: 5.69 via INTRAVENOUS

## 2024-01-10 NOTE — Telephone Encounter (Addendum)
 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  Treatment goal: Maintenance  Counseled patient on administration, dosing, side effects, monitoring, drug-food interactions, safe handling, storage, and disposal.  Patient will take Brukinsa  80mg  capsules, 2 capsules (160mg ) by mouth every 12 hours. Patient knows to avoid grapefruit or grapefruit juice while on therapy with Brukinsa .  Brukinsa  start date: 01/12/2024  Adverse effects include but are not limited to: bruising, decreased blood counts, rash, diarrhea, fatigue, musculoskeletal pain/arthralgias, peripheral edema, change in kidney function (increase of serum creatinine), hyperglycemia, and hemorrhage. Small risk of atrial fibrillation also discussed with patient. Patient will obtain anti diarrheal and alert the office of 4 or more loose stools above baseline.  Reviewed with patient importance of keeping a medication schedule and plan for any missed doses. No barriers to medication adherence identified.  Medication reconciliation performed and medication/allergy list updated.  Distress thermometer not completed during telephone call as patient has been on previous lines of therapy.   Communication and Learning Assessment Primary learner: Patient Barriers to learning: No barriers Preferred language: English Learning preferences: Listening Reading  All questions answered. Patient voiced understanding and appreciation.   Medication education handout placed in mail for patient. Patient knows to call the office with questions or concerns. Oral Chemotherapy Clinic phone number provided to patient.   Dimple Bastyr, PharmD Hematology/Oncology Clinical Pharmacist [place] [facility] [phone] 01/10/2024   11:16 AM

## 2024-01-10 NOTE — Progress Notes (Signed)
 Patient counseled on Brukinsa  in telephone encounter opened on 12/31/2023.  Dana Butler, PharmD Hematology/Oncology Clinical Pharmacist [place] [facility] [phone]

## 2024-01-10 NOTE — Progress Notes (Signed)
 Specialty Pharmacy Initial Fill Coordination Note  Dana Butler is a 68 y.o. female contacted today regarding refills of specialty medication(s) Zanubrutinib  (BRUKINSA ) .  Patient requested [name] at [facility] [place]  on 01/11/24   Medication will be filled on 01/10/24.   Patient is aware of $1,396.78 copayment.    She will pay when she picks up medication.  [name], CPhT [place]  [facility] Specialty Pharmacy Services Pharmacy Technician Patient Advocate Specialist II [name] Phone: [phone]  Fax: [phone] [name]

## 2024-01-11 ENCOUNTER — Other Ambulatory Visit (HOSPITAL_COMMUNITY): Payer: Self-pay

## 2024-01-14 ENCOUNTER — Other Ambulatory Visit: Payer: Self-pay

## 2024-01-15 ENCOUNTER — Other Ambulatory Visit: Payer: Self-pay

## 2024-01-16 ENCOUNTER — Other Ambulatory Visit: Payer: Self-pay

## 2024-01-27 ENCOUNTER — Other Ambulatory Visit: Payer: Self-pay

## 2024-01-27 DIAGNOSIS — C8580 Other specified types of non-Hodgkin lymphoma, unspecified site: Secondary | ICD-10-CM

## 2024-01-28 ENCOUNTER — Inpatient Hospital Stay: Attending: Hematology

## 2024-01-28 ENCOUNTER — Inpatient Hospital Stay (HOSPITAL_BASED_OUTPATIENT_CLINIC_OR_DEPARTMENT_OTHER): Admitting: Hematology

## 2024-01-28 VITALS — BP 127/52 | HR 64 | Temp 97.3°F | Resp 20 | Wt 103.9 lb

## 2024-01-28 DIAGNOSIS — I517 Cardiomegaly: Secondary | ICD-10-CM | POA: Diagnosis not present

## 2024-01-28 DIAGNOSIS — C8309 Small cell B-cell lymphoma, extranodal and solid organ sites: Secondary | ICD-10-CM | POA: Insufficient documentation

## 2024-01-28 DIAGNOSIS — M810 Age-related osteoporosis without current pathological fracture: Secondary | ICD-10-CM

## 2024-01-28 DIAGNOSIS — J90 Pleural effusion, not elsewhere classified: Secondary | ICD-10-CM | POA: Insufficient documentation

## 2024-01-28 DIAGNOSIS — C8580 Other specified types of non-Hodgkin lymphoma, unspecified site: Secondary | ICD-10-CM | POA: Diagnosis not present

## 2024-01-28 DIAGNOSIS — I700 Atherosclerosis of aorta: Secondary | ICD-10-CM | POA: Insufficient documentation

## 2024-01-28 DIAGNOSIS — I2510 Atherosclerotic heart disease of native coronary artery without angina pectoris: Secondary | ICD-10-CM | POA: Diagnosis not present

## 2024-01-28 LAB — CBC WITH DIFFERENTIAL (CANCER CENTER ONLY)
Abs Immature Granulocytes: 0.37 K/uL — ABNORMAL HIGH (ref 0.00–0.07)
Basophils Absolute: 0.1 K/uL (ref 0.0–0.1)
Basophils Relative: 0 %
Eosinophils Absolute: 0.2 K/uL (ref 0.0–0.5)
Eosinophils Relative: 0 %
HCT: 37 % (ref 36.0–46.0)
Hemoglobin: 10.5 g/dL — ABNORMAL LOW (ref 12.0–15.0)
Immature Granulocytes: 0 %
Lymphocytes Relative: 98 %
Lymphs Abs: 281.9 K/uL — ABNORMAL HIGH (ref 0.7–4.0)
MCH: 28.9 pg (ref 26.0–34.0)
MCHC: 28.4 g/dL — ABNORMAL LOW (ref 30.0–36.0)
MCV: 101.9 fL — ABNORMAL HIGH (ref 80.0–100.0)
Monocytes Absolute: 2.2 K/uL — ABNORMAL HIGH (ref 0.1–1.0)
Monocytes Relative: 1 %
Neutro Abs: 3.2 K/uL (ref 1.7–7.7)
Neutrophils Relative %: 1 %
Platelet Count: 231 K/uL (ref 150–400)
RBC: 3.63 MIL/uL — ABNORMAL LOW (ref 3.87–5.11)
RDW: 20.7 % — ABNORMAL HIGH (ref 11.5–15.5)
Smear Review: NORMAL
WBC Count: 287.8 K/uL (ref 4.0–10.5)
nRBC: 0 % (ref 0.0–0.2)

## 2024-01-28 LAB — CMP (CANCER CENTER ONLY)
ALT: 14 U/L (ref 0–44)
AST: 21 U/L (ref 15–41)
Albumin: 4.2 g/dL (ref 3.5–5.0)
Alkaline Phosphatase: 338 U/L — ABNORMAL HIGH (ref 38–126)
Anion gap: 4 — ABNORMAL LOW (ref 5–15)
BUN: 10 mg/dL (ref 8–23)
CO2: 28 mmol/L (ref 22–32)
Calcium: 9.3 mg/dL (ref 8.9–10.3)
Chloride: 108 mmol/L (ref 98–111)
Creatinine: 0.84 mg/dL (ref 0.44–1.00)
GFR, Estimated: >60 mL/min (ref >60)
Glucose, Bld: 90 mg/dL (ref 70–99)
Potassium: 4.2 mmol/L (ref 3.5–5.1)
Sodium: 140 mmol/L (ref 135–145)
Total Bilirubin: 0.7 mg/dL (ref 0.0–1.2)
Total Protein: 6.4 g/dL — ABNORMAL LOW (ref 6.5–8.1)

## 2024-01-28 LAB — LACTATE DEHYDROGENASE: LDH: 182 U/L (ref 98–192)

## 2024-01-28 NOTE — Progress Notes (Signed)
 CRITICAL VALUE STICKER  CRITICAL VALUE:WBC: 287.8  DATE & TIME NOTIFIED: 01/28/24 1:00  MD NOTIFIED: Dr Onesimo  TIME OF NOTIFICATION:1:01  RESPONSE:  pt to be seen in clinic by Dr Onesimo today

## 2024-01-28 NOTE — Progress Notes (Signed)
 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   SUMMARY OF ONCOLOGIC HISTORY: Oncology History  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HCC)  06/29/2014 Initial Diagnosis   left axillary lymph node biopsy 06/29/2014 for a B-cell non-Hodgkin's lymphoma, consistent with marginal zone nodal lymphoma, advanced stage   07/19/2014 Miscellaneous   PET scanning which showed bilateral cervical adenopathy, bilateral axillary adenopathy, some small left common iliac lymph nodes and a normal spleen   04/12/2018 Pathology Results   biopsy of a right breast mass 04/12/2018 showed 89% of the cells to be lambda restricted, with immunophenotype consistent with marginal zone non-Hodgkin's lymphoma (CD19, 20 and 23+)   05/13/2021 - 07/15/2021 Chemotherapy   Patient is on Treatment Plan : NON-HODGKINS LYMPHOMA Rituximab  q21d     08/05/2021 - 08/05/2021 Chemotherapy   Patient is on Treatment Plan : NON-HODGKINS LYMPHOMA Rituximab  q60d Maintenance     09/25/2021 - 12/23/2021 Chemotherapy   Patient is on Treatment Plan : NON-HODGKINS LYMPHOMA Rituximab  D1 / Bendamustine  D1,2 q28d     02/16/2022 -  Chemotherapy   Patient is on Treatment Plan : NON-HODGKINS LYMPHOMA Rituximab  q60d Maintenance     Patient is on Treatment Plan :  NON-HODGKINS LYMPHOMA Rituximab  q60d Maintenance   INTERVAL HISTORY:  Dana Butler is a 68 y.o. female being seen here today for follow-up for continued evaluation and management of nodal marginal zone lymphoma.   she  was last seen by me on 12/20/2023; at the time she mentioned experiencing fatigue over the past couple of months, but was otherwise doing well.   Today, she denies side affects from her recently started Zanubrutinib . She did intiially have fatigue, but this has improved since. Denies rashes, abdominal bleeding, leg edema, diarrhea, SOB, insomnia, new bone pain or tenderness.   She states that she has stopped taking Zepbound, and due to elevated Calcium levels she also stopped her Vitamin-D and Calcium, which she was taking once weekly.   REVIEW OF SYSTEMS:    10 Point review of systems of done and is negative except as noted above.  MEDICAL HISTORY Past Medical History:  Diagnosis Date   Cancer (HCC) 2016   marginal zone non-hodgkins lymphoma   Medical history non-contributory     SURGICAL HISTORY Past Surgical History:  Procedure Laterality Date   ABDOMINAL HYSTERECTOMY     AXILLARY LYMPH NODE BIOPSY Left 06/29/2014   Procedure: AXILLARY LYMPH NODE BIOPSY;  Surgeon: Boas Eletha, MD;  Location: [facility];  Service: General;  Laterality: Left;   COLONOSCOPY     DIAGNOSTIC LAPAROSCOPY  2005   BSO   MASS EXCISION N/A 02/23/2019   Procedure: Excision of umbilicus skin lesion;  Surgeon: Lowery Estefana RAMAN, DO;  Location: [facility];  Service: Plastics;  Laterality: N/A;    SOCIAL HISTORY Social History   Tobacco Use   Smoking status: Never   Smokeless tobacco: Never  Substance Use Topics   Alcohol use: No   Drug use: No    Social History   Social History Narrative   Not on file    SOCIAL DRIVERS OF HEALTH  SDOH Screenings   Food Insecurity: No Food Insecurity (06/30/2023)   Received from [facility]  Housing: Low Risk  (06/30/2023)   Received from [facility]  Transportation Needs: No Transportation Needs (06/30/2023)   Received from [facility]  Utilities: Not At Risk (06/30/2023)   Received from [facility]  Depression (PHQ2-9):  Low Risk  (08/20/2021)  Financial Resource Strain: Low Risk  (06/30/2023)   Received from [facility]  Tobacco Use: Low Risk  (10/08/2023)   Received from [facility]     FAMILY HISTORY Family History  Problem Relation Age of Onset   Ovarian cancer Maternal Aunt 68   Breast cancer Paternal Aunt 5     ALLERGIES: is allergic to chloraprep one step [chlorhexidine  gluconate], latex, and tape.  MEDICATIONS  Current Outpatient Medications  Medication Sig Dispense Refill   acyclovir  (ZOVIRAX ) 400 MG tablet Take 1 tablet (400 mg total) by mouth daily. (Patient not taking: Reported on 12/20/2023) 30 tablet 3   ALPRAZolam (XANAX) 0.5 MG tablet      calcium carbonate (OS-CAL) 1250 (500 Ca) MG chewable tablet Chew by mouth. (Patient not taking: Reported on 12/16/2022)     Cholecalciferol (VITAMIN D3) 5000 UNITS TABS Take by mouth.     dexamethasone  (DECADRON ) 4 MG tablet Take 2 tablets (8 mg total) by mouth daily. Take 2 tablets daily. Start the day after bendamustine  chemotherapy for 2 days. Take with food. (Patient not taking: Reported on 12/20/2023) 60 tablet 1   erythromycin  ophthalmic ointment Place 1 Application into the left eye at bedtime. (Patient not taking: Reported on 12/20/2023) 3.5 g 0   estradiol (VIVELLE-DOT) 0.05 MG/24HR patch Place 1 patch onto the skin 2 (two) times a week.     ferrous sulfate  325 (65 FE) MG EC tablet Take 1 tablet (325 mg total) by mouth daily with breakfast.  3   glucosamine-chondroitin 500-400 MG tablet Take by mouth.     Multiple Vitamins-Minerals (MULTIVITAMIN WITH MINERALS) tablet Take 1 tablet by mouth daily.     OMEGA-3 FATTY ACIDS-VITAMIN E PO 1,000 capsules. Take 2 capsules by mouth daily.     ondansetron  (ZOFRAN ) 8 MG tablet Take 1 tablet (8 mg total) by mouth 2 (two) times daily as needed for nausea or vomiting (2 times daily PRN for refractory nausea / vomiting). Start on day 2 after bendamustine  chemo. (Patient not taking: Reported on 12/20/2023) 20  tablet 1   predniSONE  (DELTASONE ) 20 MG tablet Take 3 tablets (60 mg total) by mouth daily with breakfast. 15 tablet 0   prochlorperazine  (COMPAZINE ) 10 MG tablet Take 1 tablet (10 mg total) by mouth every 6 (six) hours as needed for nausea or vomiting. (Patient not taking: Reported on 12/20/2023) 30 tablet 1   riTUXimab  (RITUXAN ) 100 MG/10ML injection See admin instructions. (Patient not taking: Reported on 12/20/2023)     Turmeric 450 MG CAPS as directed Orally     zaleplon (SONATA) 10 MG capsule Take 10 mg by mouth at bedtime.     zanubrutinib  (BRUKINSA ) 80 MG capsule Take 2 capsules (160 mg total) by mouth 2 (two) times daily. 120 capsule 2   No current facility-administered medications for this visit.    VITALS: Vitals:   01/28/24 1257  BP: (!) 127/52  Pulse: 64  Resp: 20  Temp: (!) 97.3 F (36.3 C)  SpO2: 99%   Filed Weights   01/28/24 1257  Weight: 103 lb 14.4 oz (47.1 kg)   Body mass index is 19 kg/m.  PHYSICAL EXAMINATION: ECOG PERFORMANCE STATUS: 1 - Symptomatic but completely ambulatory .BP (!) 127/52   Pulse 64   Temp (!) 97.3 F (36.3 C)   Resp 20   Wt 103 lb 14.4 oz (47.1 kg)   SpO2 99%   BMI 19.00 kg/m   GENERAL: alert, in no acute distress and comfortable SKIN: no acute rashes, no significant lesions EYES: conjunctiva are pink and non-injected, sclera anicteric OROPHARYNX: MMM, no exudates, no oropharyngeal erythema or ulceration NECK: supple, no JVD LYMPH:  no palpable lymphadenopathy in the cervical, axillary or inguinal regions LUNGS: clear to auscultation b/l with normal respiratory effort HEART: regular rate & rhythm ABDOMEN:  normoactive bowel sounds , non tender, not distended. Extremity: no pedal edema PSYCH: alert & oriented x 3 with fluent speech NEURO: no focal motor/sensory deficits  LABORATORY DATA:   I have reviewed the data as listed     Latest Ref Rng & Units 01/28/2024   12:39 PM 12/30/2023   11:03 AM 12/20/2023   11:38 AM  CBC  EXTENDED  WBC 4.0 - 10.5 K/uL 287.8  46.8  43.9   RBC 3.87 - 5.11 MIL/uL 3.63  3.49  3.58   Hemoglobin 12.0 - 15.0 g/dL 89.4  88.9  88.7   HCT 36.0 - 46.0 % 37.0  32.3  32.6   Platelets 150 - 400 K/uL 231  231  184   NEUT# 1.7 - 7.7 K/uL 3.2  4.4  4.1   Lymph# 0.7 - 4.0 K/uL 281.9  41.4  38.0    LDH Lab Results  Component Value Date   LDH 182 01/28/2024      Latest Ref Rng & Units 01/28/2024   12:39 PM 12/30/2023   11:03 AM 12/20/2023   11:38 AM  CMP  Glucose 70 - 99 mg/dL 90  97  88   BUN 8 - 23 mg/dL 10  9  21    Creatinine 0.44 - 1.00 mg/dL 9.15  9.23  9.02   Sodium 135 - 145 mmol/L 140  137  139   Potassium 3.5 - 5.1 mmol/L 4.2  4.3  3.5   Chloride 98 - 111 mmol/L 108  106  104   CO2 22 - 32 mmol/L 28  29  31    Calcium 8.9 - 10.3 mg/dL 9.3  89.7  86.4   Total Protein 6.5 - 8.1 g/dL 6.4  5.9  6.0   Total Bilirubin 0.0 - 1.2 mg/dL 0.7  0.5  0.7   Alkaline Phos 38 - 126 U/L 338  198  178   AST 15 - 41 U/L 21  33  30   ALT 0 - 44 U/L 14  28  18        12/30/2023 Surgical pathology Flow Pathology Report  Clinical history: Marginal zone B-cell lymphoma  DIAGNOSIS:  Peripheral blood, flow cytometry: -  Lambda restricted B-cell lymphoproliferative disorder (89% of the lymphocytes; absolute number = 33.8 x 10  9/L).  Note: It is noted the patient has a symptomatic stage IV nodal marginal zone lymphoma.  The peripheral blood is involved by a partial CD5 positive, CD200 positive lambda restricted B-cell lymphoproliferative disorder.  While CD5/CD200 coexpression is typically seen in CLL, partial CD5 can be seen in marginal zone lymphomas and this most likely represents peripheral blood involvement by the patient's known marginal zone lymphoma.  GATING AND PHENOTYPIC ANALYSIS:  Gated population: Flow cytometric immunophenotyping is performed using antibodies to the antigens listed in the table below. Electronic gates  are placed around a cell cluster displaying light  scatter properties corresponding to: lymphocytes  Abnormal Cells in gated population: 89%  Phenotype of Abnormal Cells: CD5 partial, CD19, CD20 partial, CD200, Lambda                      Lymphoid Antigens       Myeloid Antigens Miscellaneous CD2  NEG  CD10 NEG  CD11b     ND   CD45 POS CD3  NEG  CD19 POS  CD11c     ND   HLA-Dr    ND CD4  NEG  CD20 POS  CD13 ND   CD34 NEG CD5  POS  CD22 ND   CD14 ND   CD38 NEG CD7  NEG  CD79b     ND   CD15 ND   CD138     ND CD8  NEG  CD103     ND   CD16 ND   TdT  ND CD25 ND   CD200     POS  CD33 ND   CD123     ND TCRab     ND   sKappa    NEG  CD64 ND   CD41 ND TCRgd     NEG  sLambda   POS  CD117     ND   CD61 ND CD56 NEG  cKappa    ND   MPO  ND   CD71 ND CD57 ND   cLambda   ND        [id number]      GROSS DESCRIPTION: One lavender top tube submitted from [facility] for lymphoma testing.    Final Diagnosis performed by Mark LeGolvan DO.   Electronically signed 12/30/2023 Technical and / or Professional components performed at [facility], [address] [place], [place] [address].  The above tests were developed and their performance characteristics determined by the [facility] system for the physical and immunophenotypic characterization of cell populations. They have not been cleared by the U.S. Food and Drug administration. The  FDA has determined that such clearance or approval is not necessary. This test is used for clinical purposes. It should not be  regarded as investigational or for research     RADIOGRAPHIC STUDIES: I have personally reviewed the radiological images as listed and agreed with the findings in the report. NM PET Image Restag (PS) Skull Base To Thigh Result Date: 01/14/2024 EXAM: PET AND CT SKULL BASE TO MID THIGH 01/10/2024 10:40:00 AM TECHNIQUE: PET imaging was acquired from the base of the skull to the mid thighs. Non-contrast enhanced computed tomography was obtained for attenuation correction and  anatomic localization. Mediastinal blood pool activity: SUV Max equals 1.5 Liver activity: SUVMax 1.8 RADIOPHARMACEUTICAL: F-18 FDG 5.69 mCi Uptake time 60 minutes. Glucose level 87 mg/dl. COMPARISON: CT chest, abdomen and pelvis from 11/25/2022. CLINICAL HISTORY: Evaluation of progression of nodal marginal zone lymphoma. FINDINGS: HEAD AND NECK: No tracer avid cervical lymph nodes. CHEST: Enlarged thoracic lymph nodes are identified, markedly increased in size from previous exam. -Index left axillary node measures 1.5 cm with SUV max of 1.3, axial image 66. On the previous CT this lymph node measured 0.8 cm. -Enlarged right axillary node measures 1.5 cm with SUV max of 1.3, axial image 66. Previously this measured 0.7 cm. -1.1 cm right paratracheal lymph node has an SUV max of 1.3, axial image 72. On the previous exam this measured 0.3 cm. -Left internal mammary lymph node  measures 1.3 cm with SUV max of 1.5, axial image 75. On the previous exam this measured 0.3 cm. New confluent soft tissue within the paravertebral aspect of the posterior mediastinum 10 mm in thickness with SUV max of 1.9, axial image 97. New small bilateral pleural effusions. No tracer avid pulmonary nodule. Cardiac enlargement with aortic atherosclerosis and coronary artery calcifications. No pericardial effusion. ABDOMEN AND PELVIS: Bulky confluent retroperitoneal, bilateral pelvic and inguinal adenopathy is identified. -At the level of the left renal hilum this measures 9.2 x 4.2 cm with SUV max of 1.8, axial image 146. On the previous exam there was an enlarged left periaortic node measuring 3.3 x 1.7 cm. -Nodal conglomeration along the left pelvic sidewall measures approximately 7.5 x 4.0 cm with SUV max of 1.5, axial image 204. On the previous exam there was a left external iliac node in this area measuring 0.9 x 1.5 cm. -Nodal conglomeration in the left inguinal region measures 4.1 x 2.4 cm with SUV max of 1.6. On the previous exam there  was a lymph node in this area measuring 1.7 x 1.3 cm. The spleen is normal in size without focal uptake. Atherosclerotic calcification is identified within the abdomen and pelvis. BONES AND SOFT TISSUE: Multiple tiny lucent bone lesions are identified which appear new from the previous exam. There is corresponding mild diffuse heterogeneous tracer uptake throughout the axial and appendicular skeleton. Index lesion within the right iliac bone measures 0.8 cm with SUV max of 1.8. IMPRESSION: 1. Findings consistent with progression of nodal marginal zone lymphoma. There is markedly progressive bulky thoracic, abdominal, pelvic, and inguinal adenopathy which exhibits mild low-level fdg uptake less than background liver activity but greater than blood pool activity. 2. New tiny lucent bone lesions with corresponding mild diffuse heterogeneous tracer uptake, including a new index lesion in the right iliac bone. Findings are concerning for diffuse bone marrow involvement 3. New confluent soft tissue in the paravertebral posterior mediastinum with mild increased uptake. 4. New small bilateral pleural effusions. 5. Cardiac enlargement with aortic atherosclerosis and coronary artery calcifications. 6. Spleen normal in size without focal uptake. Electronically signed by: Waddell Calk MD 01/14/2024 04:16 PM EDT RP Workstation: [id number]    ASSESSMENT & PLAN:  68 y.o. 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 Initially diagnosed in 2016. Patient was diagnosed with nodal marginal zone lymphoma based on lymph node biopsy of her left axillary lymph node on 06/29/2014 and has been following with Dr. Magrinat for active surveillance and until recently did not meet criteria for consideration of treatment. In December 2019 on 04/12/2018 the patient had a biopsy of a right breast mass that showed pathology consistent with marginal zone lymphoma.   Patient over the last year or so has had increasing  WBC counts which were as high as 91.2k in December 2022.  Patient also noted to have some anemia with hemoglobin of 11 and normal platelet counts of 234k in January 2023.  She also started developing some lymphadenopathy in the neck.   Patient is status post 4 cycles of Bendamustine  Rituxan  and is on maintenance Rituxan  at this time every 2 months   PET CT scan done 09/08/2021 revealed 1. Bulky axillary adenopathy periaortic adenopathy and iliac adenopathy with very low metabolic activity ( [place]au[place] 2). Findings consistent with low-grade lymphoma. 2. Normal spleen. 3. Diffuse permeative pattern within the pelvis and spine. No hypermetabolic skeletal lesions.   #2 status post extensive bone involvement from marginal  zone lymphoma #3 headaches without any red flags likely migrainous versus tension headaches.  Now much better controlled. #4 S/p Infectious conjunctivitis - related to chronic severe dry eyes. Being mx by her ophthalmologist.   PLAN: - Discussed lab results on 01/28/2024 in detail with patient: CBC showed WBC of 287K increased from 46K, Hemoglobin of 10.5 decreased from 11, and Lymphocytes of 281K increased from 41K. CMP with AlkPhos elevated at 338 U from 198 U and Calcium normal at 9.3.  LDH normal.   - Reviewed 01/14/2024 PET Scan:   1. Findings consistent with progression of nodal marginal zone lymphoma. There is markedly progressive bulky thoracic, abdominal, pelvic, and inguinal adenopathy which exhibits mild low-level fdg uptake less than background liver activity but greater than blood pool activity.  2. New tiny lucent bone lesions with corresponding mild diffuse heterogeneous tracer uptake, including a new index lesion in the right iliac bone. Findings are concerning for diffuse bone marrow involvement  3. New confluent soft tissue in the paravertebral posterior mediastinum with mild increased uptake.  4. New small bilateral pleural effusions.  5. Cardiac enlargement  with aortic atherosclerosis and coronary artery calcifications.  6. Spleen normal in size without focal uptake.  - Discussed option to combine Zanubrutinib   -we discussed that Zanubrutinib  (all BTK inhibitors) are known to initial cause redistribution of lymphocytes and that the extent of lymphocytosis bump can be a reflection of the extent of the underlying disease.  - Patient was instructed to monitor consitutional symptoms (fever, chills, constipation) over the next month and was informed to contact clinic if these present or worsen significantly. - Dr. Onesimo mentioned patient can start Prolia to manage Osteopenia Risk vs benfits discussed. If she is approved, cautioned her to hold medication if she has upcoming dentistry appointments requiring drilling due to bone hardening effects.  FOLLOW-UP  Labs in 3 weeks Start Prolia in 2-3 weeks RTC with Dr Onesimo with labs in 6 weeks  .The total time spent in the appointment was 30 minutes* .  All of the patient's questions were answered with apparent satisfaction. The patient knows to call the clinic with any problems, questions or concerns.   Emaline Onesimo MD MS AAHIVMS [facility] Hematology/Oncology Physician [facility]  .*Total Encounter Time as defined by the Centers for Medicare and Medicaid Services includes, in addition to the face-to-face time of a patient visit (documented in the note above) non-face-to-face time: obtaining and reviewing outside history, ordering and reviewing medications, tests or procedures, care coordination (communications with other health care professionals or caregivers) and documentation in the medical record. I,Emily Lagle,acting as a Neurosurgeon for Emaline Onesimo, MD.,have documented all relevant documentation on the behalf of Emaline Onesimo, MD,as directed by  Emaline Onesimo, MD while in the presence of Emaline Onesimo, MD.  I have reviewed the above documentation for accuracy and completeness, and I agree with the  above.  Rhea Thrun, MD

## 2024-02-01 ENCOUNTER — Other Ambulatory Visit: Payer: Self-pay

## 2024-02-03 ENCOUNTER — Encounter: Payer: Self-pay | Admitting: Hematology

## 2024-02-03 ENCOUNTER — Other Ambulatory Visit: Payer: Self-pay

## 2024-02-03 DIAGNOSIS — M810 Age-related osteoporosis without current pathological fracture: Secondary | ICD-10-CM | POA: Insufficient documentation

## 2024-02-03 NOTE — Progress Notes (Signed)
 Specialty Pharmacy Refill Coordination Note  Dana Butler is a 68 y.o. female contacted today regarding refills of specialty medication(s) Zanubrutinib  (BRUKINSA )   Patient requested Delivery   Delivery date: 02/04/24   Verified address: [address], [place] [address]   Medication will be filled on 02/03/24.

## 2024-02-03 NOTE — Progress Notes (Signed)
 Specialty Pharmacy Ongoing Clinical Assessment Note  Dana Butler is a 68 y.o. female who is being followed by the specialty pharmacy service for RxSp Oncology   Patient's specialty medication(s) reviewed today: Zanubrutinib  (BRUKINSA )   Missed doses in the last 4 weeks: 0   Patient/Caregiver did not have any additional questions or concerns.   Therapeutic benefit summary: Unable to assess   Adverse events/side effects summary: No adverse events/side effects   Patient's therapy is appropriate to: Continue    Goals Addressed             This Visit's Progress    Maintain optimal adherence to therapy   On track    Patient is on track. Patient will maintain adherence         Follow up: 3 months  [facility] Specialty Pharmacist

## 2024-02-17 ENCOUNTER — Other Ambulatory Visit: Payer: Self-pay

## 2024-02-17 DIAGNOSIS — C8580 Other specified types of non-Hodgkin lymphoma, unspecified site: Secondary | ICD-10-CM

## 2024-02-18 ENCOUNTER — Other Ambulatory Visit: Payer: Self-pay

## 2024-02-18 ENCOUNTER — Inpatient Hospital Stay

## 2024-02-18 DIAGNOSIS — C8580 Other specified types of non-Hodgkin lymphoma, unspecified site: Secondary | ICD-10-CM

## 2024-02-18 DIAGNOSIS — C8309 Small cell B-cell lymphoma, extranodal and solid organ sites: Secondary | ICD-10-CM | POA: Diagnosis not present

## 2024-02-18 LAB — CBC WITH DIFFERENTIAL (CANCER CENTER ONLY)
Abs Immature Granulocytes: 0.47 K/uL — ABNORMAL HIGH (ref 0.00–0.07)
Basophils Absolute: 0.1 K/uL (ref 0.0–0.1)
Basophils Relative: 0 %
Eosinophils Absolute: 0.2 K/uL (ref 0.0–0.5)
Eosinophils Relative: 0 %
HCT: 41 % (ref 36.0–46.0)
Hemoglobin: 10.7 g/dL — ABNORMAL LOW (ref 12.0–15.0)
Immature Granulocytes: 0 %
Lymphocytes Relative: 99 %
Lymphs Abs: 393.8 K/uL — ABNORMAL HIGH (ref 0.7–4.0)
MCH: 27.3 pg (ref 26.0–34.0)
MCHC: 26.1 g/dL — ABNORMAL LOW (ref 30.0–36.0)
MCV: 104.6 fL — ABNORMAL HIGH (ref 80.0–100.0)
Monocytes Absolute: 1 K/uL (ref 0.1–1.0)
Monocytes Relative: 0 %
Neutro Abs: 3.9 K/uL (ref 1.7–7.7)
Neutrophils Relative %: 1 %
Platelet Count: 196 K/uL (ref 150–400)
RBC: 3.92 MIL/uL (ref 3.87–5.11)
Smear Review: NORMAL
WBC Count: 399.4 K/uL (ref 4.0–10.5)
nRBC: 0 % (ref 0.0–0.2)

## 2024-02-18 LAB — CMP (CANCER CENTER ONLY)
ALT: 17 U/L (ref 0–44)
AST: 25 U/L (ref 15–41)
Albumin: 4.3 g/dL (ref 3.5–5.0)
Alkaline Phosphatase: 266 U/L — ABNORMAL HIGH (ref 38–126)
Anion gap: 3 — ABNORMAL LOW (ref 5–15)
BUN: 11 mg/dL (ref 8–23)
CO2: 30 mmol/L (ref 22–32)
Calcium: 9.2 mg/dL (ref 8.9–10.3)
Chloride: 107 mmol/L (ref 98–111)
Creatinine: 0.85 mg/dL (ref 0.44–1.00)
GFR, Estimated: >60 mL/min (ref >60)
Glucose, Bld: 84 mg/dL (ref 70–99)
Potassium: 4.4 mmol/L (ref 3.5–5.1)
Sodium: 140 mmol/L (ref 135–145)
Total Bilirubin: 0.4 mg/dL (ref 0.0–1.2)
Total Protein: 6.4 g/dL — ABNORMAL LOW (ref 6.5–8.1)

## 2024-02-18 LAB — LACTATE DEHYDROGENASE: LDH: 166 U/L (ref 98–192)

## 2024-02-25 ENCOUNTER — Other Ambulatory Visit (HOSPITAL_COMMUNITY): Payer: Self-pay

## 2024-02-26 ENCOUNTER — Other Ambulatory Visit: Payer: Self-pay

## 2024-02-29 ENCOUNTER — Other Ambulatory Visit: Payer: Self-pay

## 2024-02-29 NOTE — Progress Notes (Signed)
 Specialty Pharmacy Refill Coordination Note  Dana Butler is a 68 y.o. female contacted today regarding refills of specialty medication(s) Zanubrutinib  (BRUKINSA )   Patient requested Delivery   Delivery date: 03/07/24   Verified address: [address] [place] [address]   Medication will be filled on: 03/06/24

## 2024-03-06 ENCOUNTER — Other Ambulatory Visit: Payer: Self-pay

## 2024-03-06 ENCOUNTER — Other Ambulatory Visit (HOSPITAL_COMMUNITY): Payer: Self-pay

## 2024-03-06 ENCOUNTER — Telehealth: Payer: Self-pay

## 2024-03-06 DIAGNOSIS — C8580 Other specified types of non-Hodgkin lymphoma, unspecified site: Secondary | ICD-10-CM

## 2024-03-06 MED ORDER — ZANUBRUTINIB 160 MG PO TABS
160.0000 mg | ORAL_TABLET | Freq: Two times a day (BID) | ORAL | 0 refills | Status: DC
  Filled 2024-03-06: qty 30, 15d supply, fill #0

## 2024-03-06 MED ORDER — ZANUBRUTINIB 160 MG PO TABS
160.0000 mg | ORAL_TABLET | Freq: Two times a day (BID) | ORAL | 0 refills | Status: DC
  Filled 2024-03-06: qty 60, 30d supply, fill #0

## 2024-03-06 NOTE — Addendum Note (Signed)
 Addended by: Gerrett Loman M on: 03/06/2024 10:17 AM   Modules accepted: Orders

## 2024-03-06 NOTE — Telephone Encounter (Signed)
 Oncology Pharmacist Encounter   Received notification from specialty pharmacy that patient needs a refill of the Brukinsa  changed from capsules to tablets. Notified MD and sent in tablets per okay from MD. Called patient to notify her the changes from capsules to tablets and how to take the medication as the dosage of the pills has changed. Patient is able to repeat that she will now only take 1 tablet (160mg ) by mouth twice daily not the 2 tablets (160mg ) twice daily. Patient asked if this medication may be causing her to bruise easier as she could not remember, discussed with patient that this may happen from the medication and if she notices a bruise is not going away to call the office. Patient agrees and was appreciative of the call.   Kutter Schnepf, PharmD Hematology/Oncology Clinical Pharmacist [facility] [phone]

## 2024-03-13 ENCOUNTER — Other Ambulatory Visit: Payer: Self-pay

## 2024-03-13 DIAGNOSIS — C8580 Other specified types of non-Hodgkin lymphoma, unspecified site: Secondary | ICD-10-CM

## 2024-03-14 ENCOUNTER — Inpatient Hospital Stay: Admitting: Hematology

## 2024-03-14 ENCOUNTER — Inpatient Hospital Stay

## 2024-03-14 ENCOUNTER — Inpatient Hospital Stay: Attending: Hematology

## 2024-03-14 VITALS — BP 112/71 | HR 70 | Temp 97.5°F | Resp 20 | Wt 104.4 lb

## 2024-03-14 DIAGNOSIS — D649 Anemia, unspecified: Secondary | ICD-10-CM | POA: Insufficient documentation

## 2024-03-14 DIAGNOSIS — C8580 Other specified types of non-Hodgkin lymphoma, unspecified site: Secondary | ICD-10-CM

## 2024-03-14 DIAGNOSIS — I517 Cardiomegaly: Secondary | ICD-10-CM | POA: Insufficient documentation

## 2024-03-14 DIAGNOSIS — J90 Pleural effusion, not elsewhere classified: Secondary | ICD-10-CM | POA: Insufficient documentation

## 2024-03-14 DIAGNOSIS — I2510 Atherosclerotic heart disease of native coronary artery without angina pectoris: Secondary | ICD-10-CM | POA: Insufficient documentation

## 2024-03-14 DIAGNOSIS — C8309 Small cell B-cell lymphoma, extranodal and solid organ sites: Secondary | ICD-10-CM | POA: Insufficient documentation

## 2024-03-14 DIAGNOSIS — I700 Atherosclerosis of aorta: Secondary | ICD-10-CM | POA: Insufficient documentation

## 2024-03-14 LAB — LACTATE DEHYDROGENASE: LDH: 198 U/L (ref 105–235)

## 2024-03-14 LAB — CBC WITH DIFFERENTIAL (CANCER CENTER ONLY)
Abs Immature Granulocytes: 0.37 K/uL — ABNORMAL HIGH (ref 0.00–0.07)
Basophils Absolute: 0 K/uL (ref 0.0–0.1)
Basophils Relative: 0 %
Eosinophils Absolute: 0.2 K/uL (ref 0.0–0.5)
Eosinophils Relative: 0 %
HCT: 38 % (ref 36.0–46.0)
Hemoglobin: 10.3 g/dL — ABNORMAL LOW (ref 12.0–15.0)
Immature Granulocytes: 0 %
Lymphocytes Relative: 99 %
Lymphs Abs: 370.4 K/uL — ABNORMAL HIGH (ref 0.7–4.0)
MCH: 27.7 pg (ref 26.0–34.0)
MCHC: 27.1 g/dL — ABNORMAL LOW (ref 30.0–36.0)
MCV: 102.2 fL — ABNORMAL HIGH (ref 80.0–100.0)
Monocytes Absolute: 0.4 K/uL (ref 0.1–1.0)
Monocytes Relative: 0 %
Neutro Abs: 3.8 K/uL (ref 1.7–7.7)
Neutrophils Relative %: 1 %
Platelet Count: 205 K/uL (ref 150–400)
RBC: 3.72 MIL/uL — ABNORMAL LOW (ref 3.87–5.11)
Smear Review: NORMAL
WBC Count: 375.2 K/uL (ref 4.0–10.5)
nRBC: 0 % (ref 0.0–0.2)

## 2024-03-14 LAB — CMP (CANCER CENTER ONLY)
ALT: 33 U/L (ref 0–44)
AST: 34 U/L (ref 15–41)
Albumin: 4.2 g/dL (ref 3.5–5.0)
Alkaline Phosphatase: 222 U/L — ABNORMAL HIGH (ref 38–126)
Anion gap: 8 (ref 5–15)
BUN: 12 mg/dL (ref 8–23)
CO2: 28 mmol/L (ref 22–32)
Calcium: 9.6 mg/dL (ref 8.9–10.3)
Chloride: 107 mmol/L (ref 98–111)
Creatinine: 0.88 mg/dL (ref 0.44–1.00)
GFR, Estimated: >60 mL/min (ref >60)
Glucose, Bld: 95 mg/dL (ref 70–99)
Potassium: 4 mmol/L (ref 3.5–5.1)
Sodium: 142 mmol/L (ref 135–145)
Total Bilirubin: 0.3 mg/dL (ref 0.0–1.2)
Total Protein: 5.9 g/dL — ABNORMAL LOW (ref 6.5–8.1)

## 2024-03-14 MED ORDER — DENOSUMAB 60 MG/ML ~~LOC~~ SOSY: 60.0000 mg | PREFILLED_SYRINGE | Freq: Once | SUBCUTANEOUS | Status: AC

## 2024-03-14 NOTE — Progress Notes (Signed)
 No dental clearance required for Prolia per Beth/Dr. Onesimo. Okay to proceed with treatment today.  Harlene Nasuti, PharmD Oncology Infusion Pharmacist 03/14/2024 3:17 PM

## 2024-03-14 NOTE — Progress Notes (Signed)
 SABRA

## 2024-03-14 NOTE — Progress Notes (Signed)
 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  HISTORY OF PRESENTING ILLNESS: See previous notes for details on initial presentation (09/03/2021).   SUMMARY OF ONCOLOGIC HISTORY: Oncology History  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HCC)  06/29/2014 Initial Diagnosis   left axillary lymph node biopsy 06/29/2014 for a B-cell non-Hodgkin's lymphoma, consistent with marginal zone nodal lymphoma, advanced stage   07/19/2014 Miscellaneous   PET scanning which showed bilateral cervical adenopathy, bilateral axillary adenopathy, some small left common iliac lymph nodes and a normal spleen   04/12/2018 Pathology Results   biopsy of a right breast mass 04/12/2018 showed 89% of the cells to be lambda restricted, with immunophenotype consistent with marginal zone non-Hodgkin's lymphoma (CD19, 20 and 23+)   05/13/2021 - 07/15/2021 Chemotherapy   Patient is on Treatment Plan : NON-HODGKINS LYMPHOMA Rituximab  q21d     08/05/2021 - 08/05/2021 Chemotherapy   Patient is on Treatment Plan : NON-HODGKINS LYMPHOMA Rituximab  q60d Maintenance     09/25/2021 - 12/23/2021 Chemotherapy   Patient is on Treatment Plan : NON-HODGKINS LYMPHOMA Rituximab  D1 / Bendamustine  D1,2 q28d     02/16/2022 -  Chemotherapy   Patient is on Treatment Plan : NON-HODGKINS LYMPHOMA Rituximab  q60d Maintenance       INTERVAL HISTORY:  Dana Butler is a 68 y.o. female who is here today for continued evaluation and management of nodal marginal zone lymphoma .   she was last seen by me on 01/28/2024; at the time she mentioned experiencing fatigue as a  side effect but it had improved.   Today, she reports she is more prone to bruising. She also reports some increased bladder frequency. She denies any fevers/chills, drenching night sweats, abdominal pain, new bone pains, leg swelling, SOB, or change in breathing  She is tolerating the zanubrutinib  well. She discontinued her 450mg  capsules of turmeric.  She reports she is hydrated and maintaining a good diet.   REVIEW OF SYSTEMS:   10 Point review of systems of done and is negative except as noted above.  MEDICAL HISTORY Past Medical History:  Diagnosis Date   Cancer (HCC) 2016   marginal zone non-hodgkins lymphoma   Medical history non-contributory     SURGICAL HISTORY Past Surgical History:  Procedure Laterality Date   ABDOMINAL HYSTERECTOMY     AXILLARY LYMPH NODE BIOPSY Left 06/29/2014   Procedure: AXILLARY LYMPH NODE BIOPSY;  Surgeon: Boas Eletha, MD;  Location: [facility];  Service: General;  Laterality: Left;   COLONOSCOPY     DIAGNOSTIC LAPAROSCOPY  2005   BSO   MASS EXCISION N/A 02/23/2019   Procedure: Excision of umbilicus skin lesion;  Surgeon: Lowery Estefana RAMAN, DO;  Location: [facility];  Service: Plastics;  Laterality: N/A;    SOCIAL HISTORY Social History   Tobacco Use   Smoking status: Never   Smokeless tobacco: Never  Substance Use Topics   Alcohol use: No   Drug use: No    Social History   Social History Narrative   Not on file    SOCIAL DRIVERS OF HEALTH SDOH Screenings   Food Insecurity: No Food Insecurity (06/30/2023)   Received from [facility]  Housing: Low Risk  (  06/30/2023)   Received from [facility]  Transportation Needs: No Transportation Needs (06/30/2023)   Received from [facility]  Utilities: Not At Risk (06/30/2023)   Received from [facility]  Depression [phone]): Low Risk  (08/20/2021)  Financial Resource Strain: Low Risk  (06/30/2023)   Received from [facility]  Tobacco Use: Low Risk   (03/08/2024)   Received from [facility]     FAMILY HISTORY Family History  Problem Relation Age of Onset   Ovarian cancer Maternal Aunt 68   Breast cancer Paternal Aunt 22     ALLERGIES: is allergic to chloraprep one step [chlorhexidine  gluconate], latex, and tape.  MEDICATIONS  Current Outpatient Medications  Medication Sig Dispense Refill   acyclovir  (ZOVIRAX ) 400 MG tablet Take 1 tablet (400 mg total) by mouth daily. (Patient not taking: Reported on 12/20/2023) 30 tablet 3   ALPRAZolam (XANAX) 0.5 MG tablet      calcium carbonate (OS-CAL) 1250 (500 Ca) MG chewable tablet Chew by mouth. (Patient not taking: Reported on 12/16/2022)     Cholecalciferol (VITAMIN D3) 5000 UNITS TABS Take by mouth.     dexamethasone  (DECADRON ) 4 MG tablet Take 2 tablets (8 mg total) by mouth daily. Take 2 tablets daily. Start the day after bendamustine  chemotherapy for 2 days. Take with food. (Patient not taking: Reported on 12/20/2023) 60 tablet 1   erythromycin  ophthalmic ointment Place 1 Application into the left eye at bedtime. (Patient not taking: Reported on 12/20/2023) 3.5 g 0   estradiol (VIVELLE-DOT) 0.05 MG/24HR patch Place 1 patch onto the skin 2 (two) times a week.     ferrous sulfate  325 (65 FE) MG EC tablet Take 1 tablet (325 mg total) by mouth daily with breakfast.  3   glucosamine-chondroitin 500-400 MG tablet Take by mouth.     Multiple Vitamins-Minerals (MULTIVITAMIN WITH MINERALS) tablet Take 1 tablet by mouth daily.     OMEGA-3 FATTY ACIDS-VITAMIN E PO 1,000 capsules. Take 2 capsules by mouth daily.     ondansetron  (ZOFRAN ) 8 MG tablet Take 1 tablet (8 mg total) by mouth 2 (two) times daily as needed for nausea or vomiting (2 times daily PRN for refractory nausea / vomiting). Start on day 2 after bendamustine  chemo. (Patient not taking: Reported on 12/20/2023) 20 tablet 1   predniSONE  (DELTASONE ) 20 MG tablet Take 3 tablets (60 mg total) by mouth daily with breakfast. 15 tablet 0    prochlorperazine  (COMPAZINE ) 10 MG tablet Take 1 tablet (10 mg total) by mouth every 6 (six) hours as needed for nausea or vomiting. (Patient not taking: Reported on 12/20/2023) 30 tablet 1   riTUXimab  (RITUXAN ) 100 MG/10ML injection See admin instructions. (Patient not taking: Reported on 12/20/2023)     Turmeric 450 MG CAPS as directed Orally     zaleplon (SONATA) 10 MG capsule Take 10 mg by mouth at bedtime.     zanubrutinib  (BRUKINSA ) 160 MG tablet Take 1 tablet (160 mg total) by mouth 2 (two) times daily. 60 tablet 0   No current facility-administered medications for this visit.    PHYSICAL EXAMINATION: ECOG PERFORMANCE STATUS: 1 - Symptomatic but completely ambulatory VITALS: Vitals:   03/14/24 1430  BP: 112/71  Pulse: 70  Resp: 20  Temp: (!) 97.5 F (36.4 C)  SpO2: 99%   Filed Weights   03/14/24 1430  Weight: 104 lb 6.4 oz (47.4 kg)   Body mass index is 19.1 kg/m.  GENERAL: alert, in no acute distress and comfortable SKIN: no acute  rashes, no significant lesions EYES: conjunctiva are pink and non-injected, sclera anicteric OROPHARYNX: MMM, no exudates, no oropharyngeal erythema or ulceration NECK: supple, no JVD LYMPH:  no palpable lymphadenopathy in the cervical, axillary or inguinal regions LUNGS: clear to auscultation b/l with normal respiratory effort HEART: regular rate & rhythm ABDOMEN:  normoactive bowel sounds , non tender, not distended, no hepatosplenomegaly Extremity: no pedal edema PSYCH: alert & oriented x 3 with fluent speech NEURO: no focal motor/sensory deficits  LABORATORY DATA:   I have reviewed the data as listed     Latest Ref Rng & Units 03/14/2024    2:14 PM 02/18/2024    9:41 AM 01/28/2024   12:39 PM  CBC EXTENDED  WBC 4.0 - 10.5 K/uL 375.2  399.4  287.8   RBC 3.87 - 5.11 MIL/uL 3.72  3.92  3.63   Hemoglobin 12.0 - 15.0 g/dL 89.6  89.2  89.4   HCT 36.0 - 46.0 % 38.0  41.0  37.0   Platelets 150 - 400 K/uL 205  196  231   NEUT# 1.7 -  7.7 K/uL 3.8  3.9  3.2   Lymph# 0.7 - 4.0 K/uL 370.4  393.8  281.9        Latest Ref Rng & Units 03/14/2024    2:14 PM 02/18/2024    9:41 AM 01/28/2024   12:39 PM  CMP  Glucose 70 - 99 mg/dL 95  84  90   BUN 8 - 23 mg/dL 12  11  10    Creatinine 0.44 - 1.00 mg/dL 9.11  9.14  9.15   Sodium 135 - 145 mmol/L 142  140  140   Potassium 3.5 - 5.1 mmol/L 4.0  4.4  4.2   Chloride 98 - 111 mmol/L 107  107  108   CO2 22 - 32 mmol/L 28  30  28    Calcium 8.9 - 10.3 mg/dL 9.6  9.2  9.3   Total Protein 6.5 - 8.1 g/dL 5.9  6.4  6.4   Total Bilirubin 0.0 - 1.2 mg/dL 0.3  0.4  0.7   Alkaline Phos 38 - 126 U/L 222  266  338   AST 15 - 41 U/L 34  25  21   ALT 0 - 44 U/L 33  17  14        12/30/2023 Surgical pathology Flow Pathology Report  Clinical history: Marginal zone B-cell lymphoma  DIAGNOSIS:  Peripheral blood, flow cytometry: -  Lambda restricted B-cell lymphoproliferative disorder (89% of the lymphocytes; absolute number = 33.8 x 10  9/L).  Note: It is noted the patient has a symptomatic stage IV nodal marginal zone lymphoma.  The peripheral blood is involved by a partial CD5 positive, CD200 positive lambda restricted B-cell lymphoproliferative disorder.  While CD5/CD200 coexpression is typically seen in CLL, partial CD5 can be seen in marginal zone lymphomas and this most likely represents peripheral blood involvement by the patient's known marginal zone lymphoma.  GATING AND PHENOTYPIC ANALYSIS:  Gated population: Flow cytometric immunophenotyping is performed using antibodies to the antigens listed in the table below. Electronic gates are placed around a cell cluster displaying light scatter properties corresponding to: lymphocytes  Abnormal Cells in gated population: 89%  Phenotype of Abnormal Cells: CD5 partial, CD19, CD20 partial, CD200, Lambda                      Lymphoid Antigens       Myeloid Antigens Miscellaneous CD2  NEG  CD10 NEG  CD11b     ND   CD45  POS CD3  NEG  CD19 POS  CD11c     ND   HLA-Dr    ND CD4  NEG  CD20 POS  CD13 ND   CD34 NEG CD5  POS  CD22 ND   CD14 ND   CD38 NEG CD7  NEG  CD79b     ND   CD15 ND   CD138     ND CD8  NEG  CD103     ND   CD16 ND   TdT  ND CD25 ND   CD200     POS  CD33 ND   CD123     ND TCRab     ND   sKappa    NEG  CD64 ND   CD41 ND TCRgd     NEG  sLambda   POS  CD117     ND   CD61 ND CD56 NEG  cKappa    ND   MPO  ND   CD71 ND CD57 ND   cLambda   ND        [id number]      GROSS DESCRIPTION: One lavender top tube submitted from [facility] for lymphoma testing.      Final Diagnosis performed by Mark LeGolvan DO.   Electronically signed 12/30/2023 Technical and / or Professional components performed at [facility], [address] [place], [place] [address].  The above tests were developed and their performance characteristics determined by the [facility] system for the physical and immunophenotypic characterization of cell populations. They have not been cleared by the U.S. Food and Drug administration. The  FDA has determined that such clearance or approval is not necessary. This test is used for clinical purposes. It should not be  regarded as investigational or for research      RADIOGRAPHIC STUDIES: I have personally reviewed the radiological images as listed and agreed with the findings in the report. NM PET Image Restag (PS) Skull Base To Thigh Result Date: 01/14/2024 EXAM: PET AND CT SKULL BASE TO MID THIGH 01/10/2024 10:40:00 AM TECHNIQUE: PET imaging was acquired from the base of the skull to the mid thighs. Non-contrast enhanced computed tomography was obtained for attenuation correction and anatomic localization. Mediastinal blood pool activity: SUV Max equals 1.5 Liver activity: SUVMax 1.8 RADIOPHARMACEUTICAL: F-18 FDG 5.69 mCi Uptake time 60 minutes. Glucose level 87 mg/dl. COMPARISON: CT chest, abdomen and pelvis from 11/25/2022. CLINICAL HISTORY: Evaluation of progression  of nodal marginal zone lymphoma. FINDINGS: HEAD AND NECK: No tracer avid cervical lymph nodes. CHEST: Enlarged thoracic lymph nodes are identified, markedly increased in size from previous exam. -Index left axillary node measures 1.5 cm with SUV max of 1.3, axial image 66. On the previous CT this lymph node measured 0.8 cm. -Enlarged right axillary node measures 1.5 cm with SUV max of 1.3, axial image 66. Previously this measured 0.7 cm. -1.1 cm right paratracheal lymph node has an SUV max of 1.3, axial image 72. On the previous exam this measured 0.3 cm. -Left internal mammary lymph node measures 1.3 cm with SUV max of 1.5, axial image 75. On the previous exam this measured 0.3 cm. New confluent soft tissue within the paravertebral aspect of the posterior mediastinum 10 mm in thickness with SUV max of 1.9, axial image 97. New small bilateral pleural effusions. No tracer avid pulmonary nodule. Cardiac enlargement with aortic atherosclerosis and coronary artery calcifications. No pericardial effusion.  ABDOMEN AND PELVIS: Bulky confluent retroperitoneal, bilateral pelvic and inguinal adenopathy is identified. -At the level of the left renal hilum this measures 9.2 x 4.2 cm with SUV max of 1.8, axial image 146. On the previous exam there was an enlarged left periaortic node measuring 3.3 x 1.7 cm. -Nodal conglomeration along the left pelvic sidewall measures approximately 7.5 x 4.0 cm with SUV max of 1.5, axial image 204. On the previous exam there was a left external iliac node in this area measuring 0.9 x 1.5 cm. -Nodal conglomeration in the left inguinal region measures 4.1 x 2.4 cm with SUV max of 1.6. On the previous exam there was a lymph node in this area measuring 1.7 x 1.3 cm. The spleen is normal in size without focal uptake. Atherosclerotic calcification is identified within the abdomen and pelvis. BONES AND SOFT TISSUE: Multiple tiny lucent bone lesions are identified which appear new from the previous  exam. There is corresponding mild diffuse heterogeneous tracer uptake throughout the axial and appendicular skeleton. Index lesion within the right iliac bone measures 0.8 cm with SUV max of 1.8. IMPRESSION: 1. Findings consistent with progression of nodal marginal zone lymphoma. There is markedly progressive bulky thoracic, abdominal, pelvic, and inguinal adenopathy which exhibits mild low-level fdg uptake less than background liver activity but greater than blood pool activity. 2. New tiny lucent bone lesions with corresponding mild diffuse heterogeneous tracer uptake, including a new index lesion in the right iliac bone. Findings are concerning for diffuse bone marrow involvement 3. New confluent soft tissue in the paravertebral posterior mediastinum with mild increased uptake. 4. New small bilateral pleural effusions. 5. Cardiac enlargement with aortic atherosclerosis and coronary artery calcifications. 6. Spleen normal in size without focal uptake. Electronically signed by: Waddell Calk MD 01/14/2024 04:16 PM EDT RP Workstation: [id number]    ASSESSMENT & PLAN:   68 y.o. 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 Initially diagnosed in 2016. Patient was diagnosed with nodal marginal zone lymphoma based on lymph node biopsy of her left axillary lymph node on 06/29/2014 and has been following with Dr. Magrinat for active surveillance and until recently did not meet criteria for consideration of treatment. In December 2019 on 04/12/2018 the patient had a biopsy of a right breast mass that showed pathology consistent with marginal zone lymphoma.   Patient over the last year or so has had increasing WBC counts which were as high as 91.2k in December 2022.  Patient also noted to have some anemia with hemoglobin of 11 and normal platelet counts of 234k in January 2023.  She also started developing some lymphadenopathy in the neck.   Patient is status post 4 cycles of Bendamustine   Rituxan  and is on maintenance Rituxan  at this time every 2 months   PET CT scan done 09/08/2021 revealed 1. Bulky axillary adenopathy periaortic adenopathy and iliac adenopathy with very low metabolic activity ( Deauville 2). Findings consistent with low-grade lymphoma. 2. Normal spleen. 3. Diffuse permeative pattern within the pelvis and spine. No hypermetabolic skeletal lesions.   #2 status post extensive bone involvement from marginal zone lymphoma #3 headaches without any red flags likely migrainous versus tension headaches.  Now much better controlled. #4 S/p Infectious conjunctivitis - related to chronic severe dry eyes. Being mx by her ophthalmologist.    PLAN: - Discussed lab results on 03/14/2024 in detail with patient: -Hemoglobin in stable -Minimal anemia detected -WBC counts are slowly going down. Patients peripheral LNadenopathy is  rapidly improving. We discussed that the bump in peripheral blood lymphocytosis is likely redistribution of lymphocytes in the first 2-3 months of treatment. -May add rituxan  if the WBC counts do not come down  -may consider avoiding fish oil, turmeric, and vitamin E can as this can cause some increased bruising -Will continue taking the 160mg  tablet of zanubrutinib  twice a day. No natable toxicities. -Scan in 4-6 months  -RTC in 1 month   FOLLOW-UP in 1 months for labs and follow-up with Dr. Onesimo.  The total time spent in the appointment was 30 minutes* .  All of the patient's questions were answered and the patient knows to call the clinic with any problems, questions, or concerns.  Emaline Onesimo MD MS AAHIVMS [facility] Hematology/Oncology Physician [facility]  *Total Encounter Time as defined by the Centers for Medicare and Medicaid Services includes, in addition to the face-to-face time of a patient visit (documented in the note above) non-face-to-face time: obtaining and reviewing outside history, ordering and reviewing  medications, tests or procedures, care coordination (communications with other health care professionals or caregivers) and documentation in the medical record.  I, Alan Blowers, acting as a neurosurgeon for Emaline Onesimo, MD.,have documented all relevant documentation on the behalf of Emaline Onesimo, MD,as directed by  Emaline Onesimo, MD while in the presence of Emaline Onesimo, MD.  I have reviewed the above documentation for accuracy and completeness, and I agree with the above.  Jovann Luse, MD

## 2024-03-16 ENCOUNTER — Other Ambulatory Visit: Payer: Self-pay

## 2024-03-20 ENCOUNTER — Encounter: Payer: Self-pay | Admitting: Hematology

## 2024-03-29 ENCOUNTER — Other Ambulatory Visit (HOSPITAL_COMMUNITY): Payer: Self-pay

## 2024-03-29 ENCOUNTER — Other Ambulatory Visit: Payer: Self-pay | Admitting: Hematology

## 2024-03-29 ENCOUNTER — Other Ambulatory Visit: Payer: Self-pay

## 2024-03-29 DIAGNOSIS — C8580 Other specified types of non-Hodgkin lymphoma, unspecified site: Secondary | ICD-10-CM

## 2024-03-29 MED ORDER — ZANUBRUTINIB 160 MG PO TABS
160.0000 mg | ORAL_TABLET | Freq: Two times a day (BID) | ORAL | 0 refills | Status: DC
  Filled 2024-03-29 – 2024-03-31 (×2): qty 60, 30d supply, fill #0

## 2024-03-31 ENCOUNTER — Other Ambulatory Visit: Payer: Self-pay

## 2024-03-31 NOTE — Progress Notes (Signed)
 Specialty Pharmacy Refill Coordination Note  Dana Butler is a 68 y.o. female contacted today regarding refills of specialty medication(s) Zanubrutinib  (BRUKINSA )   Patient requested Delivery   Delivery date: 04/04/24   Verified address: [address] [place] [address]   Medication will be filled on: 04/03/24

## 2024-04-03 ENCOUNTER — Other Ambulatory Visit: Payer: Self-pay

## 2024-04-11 ENCOUNTER — Other Ambulatory Visit: Payer: Self-pay

## 2024-04-12 ENCOUNTER — Inpatient Hospital Stay: Attending: Hematology

## 2024-04-12 ENCOUNTER — Inpatient Hospital Stay: Admitting: Hematology

## 2024-04-13 ENCOUNTER — Other Ambulatory Visit: Payer: Self-pay

## 2024-04-14 ENCOUNTER — Other Ambulatory Visit: Payer: Self-pay

## 2024-04-14 NOTE — Progress Notes (Signed)
 Specialty Pharmacy Ongoing Clinical Assessment Note  Dana Butler is a 68 y.o. female who is being followed by the specialty pharmacy service for RxSp Oncology   Patient's specialty medication(s) reviewed today: Zanubrutinib  (BRUKINSA )   Missed doses in the last 4 weeks: 0   Patient/Caregiver did not have any additional questions or concerns.   Therapeutic benefit summary: Patient is achieving benefit   Adverse events/side effects summary: No adverse events/side effects   Patient's therapy is appropriate to: Continue    Goals Addressed             This Visit's Progress    Maintain optimal adherence to therapy   On track    Patient is on track. Patient will maintain adherence         Follow up: 3 months  [facility] Specialty Pharmacist

## 2024-04-25 ENCOUNTER — Other Ambulatory Visit: Payer: Self-pay | Admitting: Hematology

## 2024-04-25 ENCOUNTER — Other Ambulatory Visit: Payer: Self-pay

## 2024-04-25 ENCOUNTER — Other Ambulatory Visit (HOSPITAL_COMMUNITY): Payer: Self-pay

## 2024-04-25 DIAGNOSIS — C8580 Other specified types of non-Hodgkin lymphoma, unspecified site: Secondary | ICD-10-CM

## 2024-04-25 MED ORDER — ZANUBRUTINIB 160 MG PO TABS
160.0000 mg | ORAL_TABLET | Freq: Two times a day (BID) | ORAL | 0 refills | Status: DC
  Filled 2024-04-25 – 2024-05-02 (×3): qty 60, 30d supply, fill #0

## 2024-05-01 ENCOUNTER — Other Ambulatory Visit: Payer: Self-pay

## 2024-05-01 DIAGNOSIS — C8580 Other specified types of non-Hodgkin lymphoma, unspecified site: Secondary | ICD-10-CM

## 2024-05-02 ENCOUNTER — Other Ambulatory Visit (HOSPITAL_COMMUNITY): Payer: Self-pay

## 2024-05-02 ENCOUNTER — Inpatient Hospital Stay: Attending: Hematology

## 2024-05-02 ENCOUNTER — Other Ambulatory Visit: Payer: Self-pay

## 2024-05-02 ENCOUNTER — Inpatient Hospital Stay: Admitting: Hematology

## 2024-05-02 VITALS — BP 103/53 | HR 78 | Temp 97.3°F | Resp 18 | Wt 102.8 lb

## 2024-05-02 DIAGNOSIS — Z803 Family history of malignant neoplasm of breast: Secondary | ICD-10-CM | POA: Insufficient documentation

## 2024-05-02 DIAGNOSIS — R053 Chronic cough: Secondary | ICD-10-CM | POA: Insufficient documentation

## 2024-05-02 DIAGNOSIS — R5383 Other fatigue: Secondary | ICD-10-CM | POA: Insufficient documentation

## 2024-05-02 DIAGNOSIS — D649 Anemia, unspecified: Secondary | ICD-10-CM | POA: Diagnosis not present

## 2024-05-02 DIAGNOSIS — C8580 Other specified types of non-Hodgkin lymphoma, unspecified site: Secondary | ICD-10-CM | POA: Diagnosis not present

## 2024-05-02 DIAGNOSIS — Z8041 Family history of malignant neoplasm of ovary: Secondary | ICD-10-CM | POA: Insufficient documentation

## 2024-05-02 DIAGNOSIS — C8309 Small cell B-cell lymphoma, extranodal and solid organ sites: Secondary | ICD-10-CM | POA: Diagnosis present

## 2024-05-02 DIAGNOSIS — R519 Headache, unspecified: Secondary | ICD-10-CM | POA: Insufficient documentation

## 2024-05-02 LAB — CBC WITH DIFFERENTIAL (CANCER CENTER ONLY)
Abs Immature Granulocytes: 0.31 K/uL — ABNORMAL HIGH (ref 0.00–0.07)
Basophils Absolute: 0.1 K/uL (ref 0.0–0.1)
Basophils Relative: 0 %
Eosinophils Absolute: 0.4 K/uL (ref 0.0–0.5)
Eosinophils Relative: 0 %
HCT: 33.4 % — ABNORMAL LOW (ref 36.0–46.0)
Hemoglobin: 10.5 g/dL — ABNORMAL LOW (ref 12.0–15.0)
Immature Granulocytes: 0 %
Lymphocytes Relative: 98 %
Lymphs Abs: 277 K/uL — ABNORMAL HIGH (ref 0.7–4.0)
MCH: 29.1 pg (ref 26.0–34.0)
MCHC: 31.4 g/dL (ref 30.0–36.0)
MCV: 92.5 fL (ref 80.0–100.0)
Monocytes Absolute: 0.4 K/uL (ref 0.1–1.0)
Monocytes Relative: 0 %
Neutro Abs: 4 K/uL (ref 1.7–7.7)
Neutrophils Relative %: 2 %
Platelet Count: 363 K/uL (ref 150–400)
RBC: 3.61 MIL/uL — ABNORMAL LOW (ref 3.87–5.11)
Smear Review: NORMAL
WBC Count: 282.2 K/uL (ref 4.0–10.5)
nRBC: 0 % (ref 0.0–0.2)

## 2024-05-02 LAB — CMP (CANCER CENTER ONLY)
ALT: 27 U/L (ref 0–44)
AST: 27 U/L (ref 15–41)
Albumin: 3.8 g/dL (ref 3.5–5.0)
Alkaline Phosphatase: 177 U/L — ABNORMAL HIGH (ref 38–126)
Anion gap: 7 (ref 5–15)
BUN: 8 mg/dL (ref 8–23)
CO2: 27 mmol/L (ref 22–32)
Calcium: 9.1 mg/dL (ref 8.9–10.3)
Chloride: 105 mmol/L (ref 98–111)
Creatinine: 0.87 mg/dL (ref 0.44–1.00)
GFR, Estimated: >60 mL/min
Glucose, Bld: 100 mg/dL — ABNORMAL HIGH (ref 70–99)
Potassium: 4 mmol/L (ref 3.5–5.1)
Sodium: 140 mmol/L (ref 135–145)
Total Bilirubin: 0.4 mg/dL (ref 0.0–1.2)
Total Protein: 6.2 g/dL — ABNORMAL LOW (ref 6.5–8.1)

## 2024-05-02 LAB — LACTATE DEHYDROGENASE: LDH: 146 U/L (ref 105–235)

## 2024-05-02 NOTE — Progress Notes (Signed)
 Specialty Pharmacy Refill Coordination Note  Dana Butler is a 69 y.o. female contacted today regarding refills of specialty medication(s) Zanubrutinib  (BRUKINSA )   Patient requested Delivery   Delivery date: 05/09/24   Verified address: [address], [place] [address]   Medication will be filled on: 05/08/24

## 2024-05-02 NOTE — Progress Notes (Signed)
 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  HISTORY OF PRESENTING ILLNESS: (09/03/2021)  Patient was diagnosed with nodal marginal zone lymphoma based on lymph node biopsy of her left axillary lymph node on 06/29/2014 and has been following with Dr. Magrinat for active surveillance and until recently did not meet criteria for consideration of treatment. In December 2019 on 04/12/2018 the patient had a biopsy of a right breast mass that showed pathology consistent with marginal zone lymphoma.   Patient over the last year or so has had increasing WBC counts which were as high as 91.2k in December 2022.  Patient also noted to have some anemia with hemoglobin of 11 and normal platelet counts of 234k in January 2023.  She also started developing some lymphadenopathy in the neck. Patient was started on Rituxan  every 3 weeks for 4 doses by Dr. Odean with resultant normalization of her WBC counts and normalization of her hemoglobin levels up to 12.6 today with a WBC count of 3.5k and platelets of 223k   Patient had a CT chest abdomen pelvis on 08/26/2021 prior to starting maintenance Rituxan .  This imaging study showed pathologically enlarged lymph nodes above and below the diaphragm consistent with patient's history of marginal zone lymphoma.  She was also noted to have prominent lucency involving the marrow and bilateral proximal humeri and clavicles with multifocal lucencies throughout the pelvis and proximal femurs with at least one extending to the outer cortex in the left iliac bone.  No splenomegaly or solid organ  lymphomatous involvement noted. Patient had an SPEP on account of her bone lesions and this showed no monoclonal paraproteinemia.   Patient has been scheduled for a PET CT scan on 09/08/2021 for further evaluation of her bone lesions as well as her adenopathy.   We discussed that there would be different approaches to treatment depending on how bulky her disease is and how much response is necessary to address any considerations for possible complications from the underlying lymphoma.   We discussed options of continued maintenance Rituxan  versus more definitive treatment with Bendamustine  Rituxan . Patient and her husband would like to think about this in more detail which would be reasonable.  We also discussed that we could look at her PET scan prior to making this decision and discussed this on the phone.   SUMMARY OF ONCOLOGIC HISTORY: Oncology History  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HCC)  06/29/2014 Initial Diagnosis   left axillary lymph node biopsy 06/29/2014 for a B-cell non-Hodgkin's lymphoma, consistent with marginal zone nodal lymphoma, advanced stage   07/19/2014 Miscellaneous   PET scanning which showed bilateral cervical adenopathy, bilateral axillary adenopathy, some small left common iliac lymph nodes and a normal spleen   04/12/2018 Pathology Results   biopsy of a right breast mass 04/12/2018 showed 89% of the cells to be lambda restricted, with immunophenotype consistent with marginal zone non-Hodgkin's lymphoma (CD19, 20 and 23+)   05/13/2021 - 07/15/2021 Chemotherapy   Patient is on Treatment Plan : NON-HODGKINS LYMPHOMA Rituximab  q21d     08/05/2021 - 08/05/2021 Chemotherapy   Patient is on Treatment Plan : NON-HODGKINS LYMPHOMA Rituximab  q60d Maintenance     09/25/2021 - 12/23/2021 Chemotherapy  Patient is on Treatment Plan : NON-HODGKINS LYMPHOMA Rituximab  D1 / Bendamustine  D1,2 q28d     02/16/2022 -  Chemotherapy   Patient is on Treatment Plan : NON-HODGKINS LYMPHOMA  Rituximab  q60d Maintenance      CURRENT THERAPY: Zanabrutinib 160 mg twice daily.   INTERVAL HISTORY:  Dana Butler is a 69 y.o. female who is here today for continued evaluation and management of Nodal Marginal Zone Lymphoma.   she was last seen by me on 03/14/2024; at the time she mentioned experiencing frequent bruising and urinary frequency.  Today, she says that she has been well overall, noting a recent development of a respiratory illness. She contacted her PCP last week for a nebulizer, and she was tested for Flu and Covid-19, which were negative. She reports improvement since onset, except for fatigue. She has been staying hydrated and nourished without issues. Notes some episodes of persistent cough.  Denies any fevers/chills.  Reports she is tolerating her Zanabrutinib 160 mg twice daily. Denies new bone pains, bleeding issues, diarrhea, leg swelling/fluid retention, palpitations, or chest pain.  REVIEW OF SYSTEMS:   10 Point review of systems of done and is negative except as noted above.  MEDICAL HISTORY Past Medical History:  Diagnosis Date   Cancer (HCC) 2016   marginal zone non-hodgkins lymphoma   Medical history non-contributory    IMMUNIZATION HISTORY Immunization History  Administered Date(s) Administered   Influenza, Quadrivalent, Recombinant, Inj, Pf 01/10/2019, 01/25/2021   Influenza,inj,Quad PF,6+ Mos 05/11/2016, 02/11/2017, 02/01/2018   Influenza,inj,quad, With Preservative 02/09/2020   PFIZER(Purple Top)SARS-COV-2 Vaccination 05/13/2019, 05/31/2019, 01/10/2020, 01/30/2020   Pneumococcal Conjugate-13 12/26/2020   Pneumococcal Polysaccharide-23 02/11/2017   Tdap 05/08/2021   Zoster Recombinant(Shingrix) 02/11/2017, 07/07/2017   SURGICAL HISTORY Past Surgical History:  Procedure Laterality Date   ABDOMINAL HYSTERECTOMY     AXILLARY LYMPH NODE BIOPSY Left 06/29/2014   Procedure: AXILLARY LYMPH NODE BIOPSY;  Surgeon: Krystal Spinner, MD;  Location: [facility];  Service: General;  Laterality: Left;   COLONOSCOPY     DIAGNOSTIC LAPAROSCOPY  2005   BSO   MASS EXCISION N/A 02/23/2019   Procedure: Excision of umbilicus skin lesion;  Surgeon: Lowery Estefana RAMAN, DO;  Location: [facility];  Service: Plastics;  Laterality: N/A;    SOCIAL HISTORY Social History[1]  Social History   Social History Narrative   Not on file    SOCIAL DRIVERS OF HEALTH SDOH Screenings   Food Insecurity: No Food Insecurity (06/30/2023)   [facility]  Housing: Low Risk (06/30/2023)   Received from [facility]  Transportation Needs: No Transportation Needs (06/30/2023)   Received from [facility]  Utilities: Not At Risk (06/30/2023)   Received from [facility]  Depression (PHQ2-9): Low Risk (08/20/2021)  Financial Resource Strain: Low Risk (06/30/2023)   Received from [facility]  Tobacco Use: Low Risk (03/08/2024)   Received from [facility]     FAMILY HISTORY Family History  Problem Relation Age of Onset   Ovarian cancer Maternal Aunt 68   Breast cancer Paternal Aunt 54     ALLERGIES: is allergic to chloraprep one step [chlorhexidine  gluconate], latex, and tape.  MEDICATIONS  Current Outpatient Medications  Medication Sig Dispense Refill   acyclovir  (ZOVIRAX ) 400 MG tablet Take 1 tablet (400 mg total) by mouth daily. (Patient not taking: Reported on 12/20/2023) 30 tablet 3   ALPRAZolam (XANAX) 0.5 MG tablet      calcium carbonate (OS-CAL) 1250 (500 Ca) MG chewable  tablet Chew by mouth. (Patient not taking: Reported on 12/16/2022)     Cholecalciferol (VITAMIN D3) 5000 UNITS TABS Take by mouth.     dexamethasone  (DECADRON ) 4 MG tablet Take 2 tablets (8 mg total) by mouth daily. Take 2 tablets daily. Start the day after bendamustine  chemotherapy for 2 days. Take with food. (Patient not taking: Reported on 12/20/2023) 60 tablet 1   erythromycin  ophthalmic ointment Place 1 Application into the left eye at  bedtime. (Patient not taking: Reported on 12/20/2023) 3.5 g 0   estradiol (VIVELLE-DOT) 0.05 MG/24HR patch Place 1 patch onto the skin 2 (two) times a week.     ferrous sulfate  325 (65 FE) MG EC tablet Take 1 tablet (325 mg total) by mouth daily with breakfast.  3   glucosamine-chondroitin 500-400 MG tablet Take by mouth.     Multiple Vitamins-Minerals (MULTIVITAMIN WITH MINERALS) tablet Take 1 tablet by mouth daily.     OMEGA-3 FATTY ACIDS-VITAMIN E PO 1,000 capsules. Take 2 capsules by mouth daily.     ondansetron  (ZOFRAN ) 8 MG tablet Take 1 tablet (8 mg total) by mouth 2 (two) times daily as needed for nausea or vomiting (2 times daily PRN for refractory nausea / vomiting). Start on day 2 after bendamustine  chemo. (Patient not taking: Reported on 12/20/2023) 20 tablet 1   predniSONE  (DELTASONE ) 20 MG tablet Take 3 tablets (60 mg total) by mouth daily with breakfast. 15 tablet 0   prochlorperazine  (COMPAZINE ) 10 MG tablet Take 1 tablet (10 mg total) by mouth every 6 (six) hours as needed for nausea or vomiting. (Patient not taking: Reported on 12/20/2023) 30 tablet 1   riTUXimab  (RITUXAN ) 100 MG/10ML injection See admin instructions. (Patient not taking: Reported on 12/20/2023)     Turmeric 450 MG CAPS as directed Orally     zaleplon (SONATA) 10 MG capsule Take 10 mg by mouth at bedtime.     zanubrutinib  (BRUKINSA ) 160 MG tablet Take 1 tablet (160 mg total) by mouth 2 (two) times daily. 60 tablet 0   No current facility-administered medications for this visit.   Facility-Administered Medications Ordered in Other Visits  Medication Dose Route Frequency Provider Last Rate Last Admin   denosumab  (PROLIA ) injection 60 mg  60 mg Subcutaneous Once Yash Cacciola Kishore, MD        PHYSICAL EXAMINATION: ECOG PERFORMANCE STATUS: 1 - Symptomatic but completely ambulatory VITALS: Vitals:   05/02/24 1018  BP: (!) 103/53  Pulse: 78  Resp: 18  Temp: (!) 97.3 F (36.3 C)  SpO2: 97%   Filed Weights    05/02/24 1018  Weight: 102 lb 12.8 oz (46.6 kg)   Body mass index is 18.8 kg/m.  GENERAL: alert, in no acute distress and comfortable SKIN: no acute rashes, no significant lesions EYES: conjunctiva are pink and non-injected, sclera anicteric OROPHARYNX: MMM, no exudates, no oropharyngeal erythema or ulceration NECK: supple, no JVD LYMPH:  no palpable lymphadenopathy in the cervical, axillary or inguinal regions LUNGS: clear to auscultation b/l with normal respiratory effort HEART: regular rate & rhythm ABDOMEN:  normoactive bowel sounds , non tender, not distended, no hepatosplenomegaly Extremity: no pedal edema PSYCH: alert & oriented x 3 with fluent speech NEURO: no focal motor/sensory deficits  LABORATORY DATA:   I have reviewed the data as listed     Latest Ref Rng & Units 05/02/2024    9:35 AM 03/14/2024    2:14 PM 02/18/2024    9:41 AM  CBC EXTENDED  WBC 4.0 -  10.5 K/uL 282.2  375.2  399.4   RBC 3.87 - 5.11 MIL/uL 3.61  3.72  3.92   Hemoglobin 12.0 - 15.0 g/dL 89.4  89.6  89.2   HCT 36.0 - 46.0 % 33.4  38.0  41.0   Platelets 150 - 400 K/uL 363  205  196   NEUT# 1.7 - 7.7 K/uL PENDING  3.8  3.9   Lymph# 0.7 - 4.0 K/uL PENDING  370.4  393.8    LDH:  Lab Results  Component Value Date   LDH 146 05/02/2024        Latest Ref Rng & Units 05/02/2024    9:35 AM 03/14/2024    2:14 PM 02/18/2024    9:41 AM  CMP  Glucose 70 - 99 mg/dL 899  95  84   BUN 8 - 23 mg/dL 8  12  11    Creatinine 0.44 - 1.00 mg/dL 9.12  9.11  9.14   Sodium 135 - 145 mmol/L 140  142  140   Potassium 3.5 - 5.1 mmol/L 4.0  4.0  4.4   Chloride 98 - 111 mmol/L 105  107  107   CO2 22 - 32 mmol/L 27  28  30    Calcium 8.9 - 10.3 mg/dL 9.1  9.6  9.2   Total Protein 6.5 - 8.1 g/dL 6.2  5.9  6.4   Total Bilirubin 0.0 - 1.2 mg/dL 0.4  0.3  0.4   Alkaline Phos 38 - 126 U/L 177  222  266   AST 15 - 41 U/L 27  34  25   ALT 0 - 44 U/L 27  33  17          12/30/2023 Surgical pathology Flow Pathology  Report  Clinical history: Marginal zone B-cell lymphoma  DIAGNOSIS:  Peripheral blood, flow cytometry: -  Lambda restricted B-cell lymphoproliferative disorder (89% of the lymphocytes; absolute number = 33.8 x 10  9/L).  Note: It is noted the patient has a symptomatic stage IV nodal marginal zone lymphoma.  The peripheral blood is involved by a partial CD5 positive, CD200 positive lambda restricted B-cell lymphoproliferative disorder.  While CD5/CD200 coexpression is typically seen in CLL, partial CD5 can be seen in marginal zone lymphomas and this most likely represents peripheral blood involvement by the patient's known marginal zone lymphoma.  GATING AND PHENOTYPIC ANALYSIS:  Gated population: Flow cytometric immunophenotyping is performed using antibodies to the antigens listed in the table below. Electronic gates are placed around a cell cluster displaying light scatter properties corresponding to: lymphocytes  Abnormal Cells in gated population: 89%  Phenotype of Abnormal Cells: CD5 partial, CD19, CD20 partial, CD200, Lambda                      Lymphoid Antigens       Myeloid Antigens Miscellaneous CD2  NEG  CD10 NEG  CD11b     ND   CD45 POS CD3  NEG  CD19 POS  CD11c     ND   HLA-Dr    ND CD4  NEG  CD20 POS  CD13 ND   CD34 NEG CD5  POS  CD22 ND   CD14 ND   CD38 NEG CD7  NEG  CD79b     ND   CD15 ND   CD138     ND CD8  NEG  CD103     ND   CD16 ND   TdT  ND CD25 ND   CD200  POS  CD33 ND   CD123     ND TCRab     ND   sKappa    NEG  CD64 ND   CD41 ND TCRgd     NEG  sLambda   POS  CD117     ND   CD61 ND CD56 NEG  cKappa    ND   MPO  ND   CD71 ND CD57 ND   cLambda   ND        [id number]      RADIOGRAPHIC STUDIES: I have personally reviewed the radiological images as listed and agreed with the findings in the report. No results found.  ASSESSMENT & PLAN:  69 y.o. 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 Initially diagnosed in  2016. Patient was diagnosed with nodal marginal zone lymphoma based on lymph node biopsy of her left axillary lymph node on 06/29/2014 and has been following with Dr. Magrinat for active surveillance and until recently did not meet criteria for consideration of treatment. In December 2019 on 04/12/2018 the patient had a biopsy of a right breast mass that showed pathology consistent with marginal zone lymphoma.   Patient over the last year or so has had increasing WBC counts which were as high as 91.2k in December 2022.  Patient also noted to have some anemia with hemoglobin of 11 and normal platelet counts of 234k in January 2023.  She also started developing some lymphadenopathy in the neck.   Patient is status post 4 cycles of Bendamustine  Rituxan  and is on maintenance Rituxan  at this time every 2 months   PET CT scan done 09/08/2021 revealed 1. Bulky axillary adenopathy periaortic adenopathy and iliac adenopathy with very low metabolic activity ( [place] 2). Findings consistent with low-grade lymphoma. 2. Normal spleen. 3. Diffuse permeative pattern within the pelvis and spine. No hypermetabolic skeletal lesions.   #2 status post extensive bone involvement from marginal zone lymphoma  #3 headaches without any red flags likely migrainous versus tension headaches.  Now much better controlled.  #4 S/p Infectious conjunctivitis - related to chronic severe dry eyes. Being mx by her ophthalmologist.  PLAN: - Discussed lab results on 05/02/2024 in detail with patient: CBC showed WBC 282.2 decreased from 375.2, Hemoglobin 10.5, improved from 10.3.  CMP with total protein 6.2, elevated from 5.9, and AlkPhos 177, decreased from 222.  LDH 146  - Vaccine counseling provided: recommended receiving RSV inoculation if not already vaccinated. Otherwise, keep UTD with age-recommended vaccinations.   - For recovering from illness, I recommend staying hydrated, using a Humidifier for moisture, and  encourage regular ambulation and activity as tolerated.  Additionally, can increase foods Vitamin-C or Zinc . - no notable toxicities from Zanubrutinib  and shall continue current dose.  FOLLOW-UP in 2 months for labs and follow-up with Dr. Onesimo.  The total time spent in the appointment was 30 minutes* .  All of the patient's questions were answered and the patient knows to call the clinic with any problems, questions, or concerns.  Emaline Onesimo MD MS AAHIVMS [facility] Hematology/Oncology Physician [facility]  *Total Encounter Time as defined by the Centers for Medicare and Medicaid Services includes, in addition to the face-to-face time of a patient visit (documented in the note above) non-face-to-face time: obtaining and reviewing outside history, ordering and reviewing medications, tests or procedures, care coordination (communications with other health care professionals or caregivers) and documentation in the medical record.  I,Emily Lagle,acting as  a scribe for Emaline Saran, MD.,have documented all relevant documentation on the behalf of Emaline Saran, MD,as directed by  Emaline Saran, MD while in the presence of Emaline Saran, MD.  I have reviewed the above documentation for accuracy and completeness, and I agree with the above.  Emaline Saran, MD     [1]  Social History Tobacco Use   Smoking status: Never   Smokeless tobacco: Never  Substance Use Topics   Alcohol use: No   Drug use: No   "

## 2024-05-03 ENCOUNTER — Other Ambulatory Visit: Payer: Self-pay

## 2024-05-03 ENCOUNTER — Telehealth: Payer: Self-pay | Admitting: Hematology

## 2024-05-03 NOTE — Telephone Encounter (Signed)
 Scheduled patient for next appointment. Called and left a voicemail with the details.

## 2024-05-04 ENCOUNTER — Other Ambulatory Visit: Payer: Self-pay

## 2024-05-05 ENCOUNTER — Other Ambulatory Visit: Payer: Self-pay | Admitting: Obstetrics and Gynecology

## 2024-05-05 ENCOUNTER — Other Ambulatory Visit: Payer: Self-pay

## 2024-05-05 DIAGNOSIS — Z1231 Encounter for screening mammogram for malignant neoplasm of breast: Secondary | ICD-10-CM

## 2024-05-07 ENCOUNTER — Other Ambulatory Visit: Payer: Self-pay

## 2024-05-08 ENCOUNTER — Other Ambulatory Visit: Payer: Self-pay

## 2024-05-08 ENCOUNTER — Encounter: Payer: Self-pay | Admitting: Hematology

## 2024-05-31 ENCOUNTER — Other Ambulatory Visit (HOSPITAL_COMMUNITY): Payer: Self-pay

## 2024-05-31 ENCOUNTER — Other Ambulatory Visit: Payer: Self-pay | Admitting: Hematology

## 2024-05-31 ENCOUNTER — Ambulatory Visit
Admission: RE | Admit: 2024-05-31 | Discharge: 2024-05-31 | Disposition: A | Source: Ambulatory Visit | Attending: Obstetrics and Gynecology | Admitting: Obstetrics and Gynecology

## 2024-05-31 ENCOUNTER — Other Ambulatory Visit: Payer: Self-pay

## 2024-05-31 DIAGNOSIS — Z1231 Encounter for screening mammogram for malignant neoplasm of breast: Secondary | ICD-10-CM

## 2024-05-31 DIAGNOSIS — C8580 Other specified types of non-Hodgkin lymphoma, unspecified site: Secondary | ICD-10-CM

## 2024-05-31 MED ORDER — ZANUBRUTINIB 160 MG PO TABS
160.0000 mg | ORAL_TABLET | Freq: Two times a day (BID) | ORAL | 0 refills | Status: AC
  Filled 2024-05-31 – 2024-06-01 (×2): qty 60, 30d supply, fill #0

## 2024-06-01 ENCOUNTER — Other Ambulatory Visit: Payer: Self-pay

## 2024-06-01 ENCOUNTER — Other Ambulatory Visit (HOSPITAL_COMMUNITY): Payer: Self-pay

## 2024-06-28 ENCOUNTER — Inpatient Hospital Stay: Attending: Hematology

## 2024-06-28 ENCOUNTER — Inpatient Hospital Stay: Admitting: Hematology
